# Patient Record
Sex: Female | Born: 1950
Health system: Southern US, Community
[De-identification: ages and names within clinical notes are randomized; demographics above are authoritative.]

## PROBLEM LIST (undated history)

## (undated) DIAGNOSIS — I10 Essential (primary) hypertension: Secondary | ICD-10-CM

## (undated) DIAGNOSIS — E785 Hyperlipidemia, unspecified: Secondary | ICD-10-CM

## (undated) DIAGNOSIS — F419 Anxiety disorder, unspecified: Secondary | ICD-10-CM

## (undated) DIAGNOSIS — T7840XA Allergy, unspecified, initial encounter: Secondary | ICD-10-CM

## (undated) DIAGNOSIS — E119 Type 2 diabetes mellitus without complications: Secondary | ICD-10-CM

## (undated) DIAGNOSIS — K219 Gastro-esophageal reflux disease without esophagitis: Secondary | ICD-10-CM

## (undated) DIAGNOSIS — M1990 Unspecified osteoarthritis, unspecified site: Secondary | ICD-10-CM

## (undated) DIAGNOSIS — C801 Malignant (primary) neoplasm, unspecified: Secondary | ICD-10-CM

## (undated) DIAGNOSIS — F32A Depression, unspecified: Secondary | ICD-10-CM

## (undated) DIAGNOSIS — G709 Myoneural disorder, unspecified: Secondary | ICD-10-CM

## (undated) HISTORY — PX: APPENDECTOMY: SHX54

## (undated) HISTORY — DX: Gastro-esophageal reflux disease without esophagitis: K21.9

## (undated) HISTORY — PX: TONSILLECTOMY: SUR1361

## (undated) HISTORY — DX: Depression, unspecified: F32.A

## (undated) HISTORY — DX: Anxiety disorder, unspecified: F41.9

## (undated) HISTORY — DX: Allergy, unspecified, initial encounter: T78.40XA

## (undated) HISTORY — PX: CHOLECYSTECTOMY: SHX55

---

## 2011-02-28 DIAGNOSIS — E119 Type 2 diabetes mellitus without complications: Secondary | ICD-10-CM | POA: Insufficient documentation

## 2011-02-28 DIAGNOSIS — M25562 Pain in left knee: Secondary | ICD-10-CM | POA: Insufficient documentation

## 2011-02-28 DIAGNOSIS — J029 Acute pharyngitis, unspecified: Secondary | ICD-10-CM | POA: Insufficient documentation

## 2011-02-28 HISTORY — DX: Pain in left knee: M25.562

## 2011-08-24 DIAGNOSIS — I10 Essential (primary) hypertension: Secondary | ICD-10-CM | POA: Insufficient documentation

## 2011-08-24 DIAGNOSIS — J309 Allergic rhinitis, unspecified: Secondary | ICD-10-CM | POA: Insufficient documentation

## 2011-08-24 HISTORY — DX: Allergic rhinitis, unspecified: J30.9

## 2012-03-08 DIAGNOSIS — F32A Depression, unspecified: Secondary | ICD-10-CM | POA: Insufficient documentation

## 2012-08-14 DIAGNOSIS — M79605 Pain in left leg: Secondary | ICD-10-CM | POA: Insufficient documentation

## 2012-08-14 HISTORY — DX: Pain in left leg: M79.605

## 2012-10-09 DIAGNOSIS — M5442 Lumbago with sciatica, left side: Secondary | ICD-10-CM

## 2012-10-09 HISTORY — DX: Lumbago with sciatica, left side: M54.42

## 2013-04-25 DIAGNOSIS — N12 Tubulo-interstitial nephritis, not specified as acute or chronic: Secondary | ICD-10-CM

## 2013-04-25 HISTORY — DX: Tubulo-interstitial nephritis, not specified as acute or chronic: N12

## 2013-06-03 DIAGNOSIS — N390 Urinary tract infection, site not specified: Secondary | ICD-10-CM

## 2013-06-03 DIAGNOSIS — K219 Gastro-esophageal reflux disease without esophagitis: Secondary | ICD-10-CM | POA: Insufficient documentation

## 2013-06-03 HISTORY — DX: Urinary tract infection, site not specified: N39.0

## 2013-08-09 DIAGNOSIS — K3532 Acute appendicitis with perforation and localized peritonitis, without abscess: Secondary | ICD-10-CM | POA: Insufficient documentation

## 2013-08-09 DIAGNOSIS — D72829 Elevated white blood cell count, unspecified: Secondary | ICD-10-CM | POA: Insufficient documentation

## 2013-08-09 DIAGNOSIS — K3580 Unspecified acute appendicitis: Secondary | ICD-10-CM | POA: Insufficient documentation

## 2013-08-09 HISTORY — DX: Unspecified acute appendicitis: K35.80

## 2013-08-09 HISTORY — DX: Acute appendicitis with perforation, localized peritonitis, and gangrene, without abscess: K35.32

## 2017-02-08 ENCOUNTER — Encounter (HOSPITAL_COMMUNITY): Payer: Self-pay | Admitting: Emergency Medicine

## 2017-02-08 ENCOUNTER — Ambulatory Visit (HOSPITAL_COMMUNITY)
Admission: EM | Admit: 2017-02-08 | Discharge: 2017-02-08 | Disposition: A | Discharge status: Home / Self Care | Payer: Self-pay | Attending: Family Medicine | Admitting: Family Medicine

## 2017-02-08 DIAGNOSIS — L84 Corns and callosities: Secondary | ICD-10-CM

## 2017-02-08 HISTORY — DX: Hyperlipidemia, unspecified: E78.5

## 2017-02-08 HISTORY — DX: Essential (primary) hypertension: I10

## 2017-02-08 HISTORY — DX: Type 2 diabetes mellitus without complications: E11.9

## 2017-02-08 NOTE — ED Triage Notes (Signed)
PT has painful calloused areas on left fourth and fifth toes. PT is a diabetic.

## 2017-02-08 NOTE — ED Provider Notes (Signed)
[facility]    CSN: [id number] Arrival date & time: 02/08/17  1343     History   Chief Complaint Chief Complaint  Patient presents with  . Foot Pain    HPI Calea Hribar is a 66 y.o. female.   66 year old Spanish female with history of diabetes presents with chronic ongoing callus formations to the left foot. Involving primarily the side of the fifth toe and beneath the fourth toe plantar aspect. She lives in [place] and had a podiatrist there our over this was not addressed. She is now living in [place] with her daughter. She states the pain started in the past couple weeks with wearing shoes and pressure is applied to the callused areas.      Past Medical History:  Diagnosis Date  . Diabetes mellitus without complication (HCC)   . Hyperlipemia   . Hypertension     There are no active problems to display for this patient.   Past Surgical History:  Procedure Laterality Date  . APPENDECTOMY      OB History    No data available       Home Medications    Prior to Admission medications   Not on File    Family History No family history on file.  Social History Social History  Substance Use Topics  . Smoking status: Current Some Day Smoker  . Smokeless tobacco: Never Used  . Alcohol use Not on file     Allergies   Patient has no known allergies.   Review of Systems Review of Systems  Constitutional: Negative.   Skin:       As per history of present illness. No signs of infection.  All other systems reviewed and are negative.    Physical Exam Triage Vital Signs ED Triage Vitals [02/08/17 1356]  Enc Vitals Group     BP (!) 136/55     Pulse Rate 83     Resp 16     Temp 98.1 F (36.7 C)     Temp Source Oral     SpO2 100 %     Weight 183 lb (83 kg)     Height      Head Circumference      Peak Flow      Pain Score 8     Pain Loc      Pain Edu?      Excl. in GC?    No data found.   Updated Vital Signs BP  (!) 136/55   Pulse 83   Temp 98.1 F (36.7 C) (Oral)   Resp 16   Wt 183 lb (83 kg)   SpO2 100%   Visual Acuity Right Eye Distance:   Left Eye Distance:   Bilateral Distance:    Right Eye Near:   Left Eye Near:    Bilateral Near:     Physical Exam  Constitutional: She is oriented to person, place, and time. She appears well-developed and well-nourished. No distress.  Eyes: EOM are normal.  Neck: Normal range of motion. Neck supple.  Cardiovascular: Normal rate.   Pulmonary/Chest: Effort normal. No respiratory distress.  Musculoskeletal: She exhibits tenderness. She exhibits no edema.  Neurological: She is alert and oriented to person, place, and time. She exhibits normal muscle tone.  Skin: Skin is warm and dry.  thick callous formation to the lateral aspect of the fifth toe and calcium formation beneath the fourth toe plantar aspect of the foot. No drainage, bleeding, erythema or  other signs of infection.  Psychiatric: She has a normal mood and affect.  Nursing note and vitals reviewed.    UC Treatments / Results  Labs (all labs ordered are listed, but only abnormal results are displayed) Labs Reviewed - No data to display  EKG  EKG Interpretation None       Radiology No results found.  Procedures Procedures (including critical care time)  Medications Ordered in UC Medications - No data to display   Initial Impression / Assessment and Plan / UC Course  I have reviewed the triage vital signs and the nursing notes.  Pertinent labs & imaging results that were available during my care of the patient were reviewed by me and considered in my medical decision making (see chart for details).    Recommend using a circular foam padding to condition around the corns to old pressure for pain control. Follow-up with the podiatrist as soon as possible.    Final Clinical Impressions(s) / UC Diagnoses   Final diagnoses:  Pre-ulcerative corn or callous    New  Prescriptions New Prescriptions   No medications on file     Controlled Substance Prescriptions [place] Controlled Substance Registry consulted? Not Applicable   Hayden Rasmussen, NP 02/08/17 1523

## 2017-02-08 NOTE — Discharge Instructions (Signed)
Recommend using a circular foam padding to condition around the corns to old pressure for pain control. Follow-up with the podiatrist as soon as possible.

## 2017-02-10 DIAGNOSIS — L84 Corns and callosities: Secondary | ICD-10-CM | POA: Insufficient documentation

## 2017-08-16 DIAGNOSIS — R42 Dizziness and giddiness: Secondary | ICD-10-CM

## 2017-08-16 DIAGNOSIS — K529 Noninfective gastroenteritis and colitis, unspecified: Secondary | ICD-10-CM | POA: Insufficient documentation

## 2017-08-16 HISTORY — DX: Dizziness and giddiness: R42

## 2017-08-16 HISTORY — DX: Noninfective gastroenteritis and colitis, unspecified: K52.9

## 2018-06-07 DIAGNOSIS — F419 Anxiety disorder, unspecified: Secondary | ICD-10-CM | POA: Insufficient documentation

## 2018-11-16 DIAGNOSIS — U071 COVID-19: Secondary | ICD-10-CM

## 2018-11-16 HISTORY — DX: COVID-19: U07.1

## 2020-01-03 ENCOUNTER — Encounter: Payer: Self-pay | Admitting: Family Medicine

## 2020-01-03 ENCOUNTER — Ambulatory Visit (INDEPENDENT_AMBULATORY_CARE_PROVIDER_SITE_OTHER): Payer: Self-pay | Admitting: Family Medicine

## 2020-01-03 ENCOUNTER — Ambulatory Visit (INDEPENDENT_AMBULATORY_CARE_PROVIDER_SITE_OTHER): Payer: Self-pay

## 2020-01-03 DIAGNOSIS — M5441 Lumbago with sciatica, right side: Secondary | ICD-10-CM

## 2020-01-03 DIAGNOSIS — G8929 Other chronic pain: Secondary | ICD-10-CM

## 2020-01-03 DIAGNOSIS — M4316 Spondylolisthesis, lumbar region: Secondary | ICD-10-CM

## 2020-01-03 DIAGNOSIS — M5442 Lumbago with sciatica, left side: Secondary | ICD-10-CM

## 2020-01-03 MED ORDER — GABAPENTIN 100 MG PO CAPS: ORAL_CAPSULE | ORAL | 3 refills | Status: DC

## 2020-01-03 MED FILL — GABAPENTIN 100 MG CAPSULE: 100 | 30 days supply | Qty: 90 | Fill #0

## 2020-01-03 NOTE — Progress Notes (Signed)
   Office Visit Note   Patient: Julia Knight           Date of Birth: 1950/10/27           MRN: [id number] Visit Date: 01/03/2020 Requested by: No referring provider defined for this encounter. PCP: Patient, No Pcp Per  Subjective: Chief Complaint  Patient presents with  . Lower Back - Pain    Pain across lower back and radiates down both legs x 2 years.  Numbness in both feet, rt>lt. She has been seeing a doctor in [place], but the patient has moved here & the daughter would like her to be seen here. Also, c/o callous lt little toe     HPI: She is here with low back and bilateral leg pain.  Symptoms started about 2 years ago, no definite injury.  She has had pain across the lower back with radiation into both legs with a constant numbness in her feet, right side greater than left.  She has seen her PCP who prescribed Tylenol.  She has not done anything else for treatment.  She has diabetes which has been controlled per her report on Metformin.  Denies bowel or bladder dysfunction, fevers or chills.  She also has a painful corn on her left fifth toe.              ROS:   All other systems were reviewed and are negative.  Objective: Vital Signs: There were no vitals taken for this visit.  Physical Exam:  General:  Alert and oriented, in no acute distress. Pulm:  Breathing unlabored. Psy:  Normal mood, congruent affect. Skin:   There is a corn on the medial side of her left fifth toe, no cellulitis. Low back: Tender in the musculature lumbosacral area.  No pain in the sciatic notch.  Straight leg raise negative, no pain with internal hip rotation.  Lower extremity strength and reflexes are still normal and equal.  Imaging: XR Lumbar Spine 2-3 Views  Result Date: 01/03/2020 X-rays show what appears to be L3-4 spondylolisthesis of about 9 mm.  L4-5 has about 3 to 4 mm of anterolisthesis.  There is multilevel degenerative disc disease with spurring.  No sign of  compression fracture or neoplasm.   Assessment & Plan: 1.  Chronic low back pain with bilateral leg numbness, possibly due to spondylolisthesis resulting in stenosis.  Cannot rule out diabetic neuropathy contributing to her numbness. -Discussed options with her and elected to try physical therapy.  Gabapentin prescribed as well. -If after 6 to 8 weeks she is not improving, we will consider MRI scan lumbar spine.     Procedures: No procedures performed  No notes on file     PMFS History: There are no problems to display for this patient.  History reviewed. No pertinent past medical history.  History reviewed. No pertinent family history.  History reviewed. No pertinent surgical history. Social History   Occupational History  . Not on file  Tobacco Use  . Smoking status: Not on file  Substance and Sexual Activity  . Alcohol use: Not on file  . Drug use: Not on file  . Sexual activity: Not on file

## 2020-01-20 ENCOUNTER — Encounter: Payer: Self-pay | Admitting: Physical Therapy

## 2020-01-20 ENCOUNTER — Ambulatory Visit (INDEPENDENT_AMBULATORY_CARE_PROVIDER_SITE_OTHER): Payer: Self-pay | Admitting: Physical Therapy

## 2020-01-20 ENCOUNTER — Other Ambulatory Visit: Payer: Self-pay

## 2020-01-20 DIAGNOSIS — M5442 Lumbago with sciatica, left side: Secondary | ICD-10-CM

## 2020-01-20 DIAGNOSIS — R2689 Other abnormalities of gait and mobility: Secondary | ICD-10-CM

## 2020-01-20 DIAGNOSIS — M6281 Muscle weakness (generalized): Secondary | ICD-10-CM

## 2020-01-20 DIAGNOSIS — M5441 Lumbago with sciatica, right side: Secondary | ICD-10-CM

## 2020-01-20 DIAGNOSIS — G8929 Other chronic pain: Secondary | ICD-10-CM

## 2020-01-20 NOTE — Therapy (Signed)
[facility] Physical Therapy [address] [place], [place], [address] Phone: [phone]   Fax:  [phone]  Physical Therapy Evaluation  Patient Details  Name: Julia Knight MRN: [id number] Date of Birth: 05-Dec-1950 Referring Provider (PT): Lavada Mesi, MD   Encounter Date: 01/20/2020   PT End of Session - 01/20/20 1508    Visit Number 1    Number of Visits 12    Date for PT Re-Evaluation 03/02/20    PT Start Time 1434    PT Stop Time 1512    PT Time Calculation (min) 38 min    Behavior During Therapy [facility] for tasks assessed/performed           History reviewed. No pertinent past medical history.  History reviewed. No pertinent surgical history.  There were no vitals filed for this visit.    Subjective Assessment - 01/20/20 1429    Subjective She is here with low back and bilateral leg pain.  Symptoms started about 2 years ago, no definite injury.  She has had pain across the lower back with radiation into both legs with a constant numbness in her feet, right side greater than left.  She has seen her PCP who prescribed Tylenol.  She has not done anything else for treatment.  She has diabetes which has been controlled per her report on Metformin.  Denies bowel or bladder dysfunction, fevers or chills.    Patient is accompained by: Interpreter    Pertinent History DM    Limitations Lifting;Standing;Walking;House hold activities    How long can you sit comfortably? 30 min    How long can you stand comfortably? 5 min    How long can you walk comfortably? 30 min at most if she has [facility]    Diagnostic tests Lumbar XR 01/03/20 "X-rays show what appears to be L3-4 spondylolisthesis of about 9 mm.  L4-5 has about 3 to 4 mm of anterolisthesis.  There is multilevel degenerative disc disease with spurring.  No sign of compression fracture or neoplasm."    Patient Stated Goals reduce pain    Currently in Pain? Yes    Pain Score 10-Worst pain ever     Pain Location Back    Pain Orientation Right;Left    Pain Descriptors / Indicators Aching;Burning;Numbness;Tightness    Pain Type Chronic pain    Pain Radiating Towards down both legs    Pain Onset More than a month ago    Pain Frequency Constant    Aggravating Factors  everything    Pain Relieving Factors meds    Multiple Pain Sites No              [facility] PT Assessment - 01/20/20 0001      Assessment   Medical Diagnosis Chronic bilateral low back pain with bilateral sciatica, Spondylolishesis    Referring Provider (PT) Hilts, Michael, MD    Onset Date/Surgical Date --   2 year onset of pain   Next MD Visit 10/8    Prior Therapy none      Precautions   Precautions None      Restrictions   Weight Bearing Restrictions No      Balance Screen   Has the patient fallen in the past 6 months No    Has the patient had a decrease in activity level because of a fear of falling?  No    Is the patient reluctant to leave their home because of a fear of falling?  No  Home Environment   Living Environment Private residence      Prior Function   Level of Independence Independent    Vocation Unemployed      Cognition   Overall Cognitive Status Within Functional Limits for tasks assessed      Sensation   Light Touch Appears Intact      Coordination   Gross Motor Movements are Fluid and Coordinated Yes      ROM / Strength   AROM / PROM / Strength AROM;Strength      AROM   AROM Assessment Site Lumbar    Lumbar Flexion WNL    Lumbar Extension 50%    Lumbar - Right Side Bend WNL    Lumbar - Left Side Bend WNL    Lumbar - Right Rotation 50%    Lumbar - Left Rotation 50%      Strength   Overall Strength Comments leg strength overall 4+/5 MMT bilat      Flexibility   Soft Tissue Assessment /Muscle Length --   tight glutes and lumbar P.S.     Palpation   Palpation comment TTP lumbar spine and paraspinals      Special Tests   Other special tests +slump test on Rt that  was neg on Lt, + SLR test on Rt that was neg on Lt      Transfers   Transfers Independent with all Transfers   increased time needed due to pain     Ambulation/Gait   Gait Comments relays she uses [facility] for community distances but not for household distances, does not arrive with AD today, has antalgic gait noted                      Objective measurements completed on examination: See above findings.       [facility] Adult PT Treatment/Exercise - 01/20/20 0001      Modalities   Modalities Electrical Stimulation      Electrical Stimulation   Electrical Stimulation Location Lumbar    Electrical Stimulation Action IFC    Electrical Stimulation Parameters tolerance in sittting and reports significant pain relief from this    Electrical Stimulation Goals Pain                  PT Education - 01/20/20 1507    Education Details HEP, POC, TENS    Person(s) Educated Patient    Methods Explanation;Demonstration;Verbal cues;Handout    Comprehension Verbalized understanding;Returned demonstration               PT Long Term Goals - 01/20/20 1517      PT LONG TERM GOAL #1   Title Pt will reduce overall pain to less than 5/10 with usual activity    Baseline 10    Time 6    Period Weeks    Status New    Target Date 03/02/20      PT LONG TERM GOAL #2   Title Pt will improve lumbar ROM to [facility].    Time 6    Period Weeks    Status New      PT LONG TERM GOAL #3   Title Pt will be I and compliant with HEP.    Time 6    Period Weeks    Status New                  Plan - 01/20/20 1512    Clinical Impression Statement Pt presents with chronic  LBP with bilat radiculopathy Rt leg worse than Lt. "X-rays show what appears to be L3-4 spondylolisthesis of about 9 mm.  L4-5 has about 3 to 4 mm of anterolisthesis." There is multilevel degenerative disc disease with spurring. She did have + SLR and slump test on Rt that was negative on Lt. She had much less LBP  after TENS therapy at end. She will benefit from skilled PT to address her deficits in lumbar ROM, decreased activity tolerance, lumbar/core strenght, and pain.    Personal Factors and Comorbidities Comorbidity 1    Comorbidities DM    Examination-Activity Limitations Lift;Stand;Squat;Carry;Sleep;Bend;Locomotion Level    Examination-Participation Restrictions Cleaning;Community Activity;Laundry;Shop    Stability/Clinical Decision Making Evolving/Moderate complexity    Clinical Decision Making Moderate    Rehab Potential Good    PT Frequency 2x / week   1-2   PT Duration 6 weeks    PT Treatment/Interventions ADLs/Self Care Home Management;Electrical Stimulation;Cryotherapy;Moist Heat;Traction;Ultrasound;Gait training;Therapeutic activities;Therapeutic exercise;Neuromuscular re-education;Manual techniques;Passive range of motion;Dry needling;Joint Manipulations;Spinal Manipulations;Taping    PT Next Visit Plan review HEP, trial flexion based program due to stenosis and anteriolisthesis    PT Home Exercise Plan Access Code: [email or web address]    Consulted and Agree with Plan of Care Patient           Patient will benefit from skilled therapeutic intervention in order to improve the following deficits and impairments:  Abnormal gait, Decreased activity tolerance, Decreased endurance, Decreased range of motion, Decreased strength, Increased fascial restricitons, Increased muscle spasms, Impaired flexibility, Postural dysfunction, Improper body mechanics, Pain, Obesity  Visit Diagnosis: Chronic bilateral low back pain with bilateral sciatica  Muscle weakness (generalized)  Other abnormalities of gait and mobility     Problem List There are no problems to display for this patient.   Birdie Riddle 01/20/2020, 3:58 PM  [facility] Physical Therapy [address] [place], [place], [address] Phone: [phone]   Fax:  [phone]  Name: Chloeanne Poteet MRN:  [id number] Date of Birth: 09/11/1950

## 2020-01-20 NOTE — Patient Instructions (Signed)
Access Code: [email or web address] URL: [email or web address] Date: 01/20/2020 Prepared by: Ivery Quale  Exercises Supine Piriformis Stretch - 2 x daily - 6 x weekly - 3 reps - 1 sets - 30 hold Supine Lower Trunk Rotation - 2 x daily - 6 x weekly - 2-3 reps - 1 sets - 10 hold Supine March - 2 x daily - 6 x weekly - 10 reps - 1-2 sets Seated Slump Nerve Glide - 2 x daily - 6 x weekly - 10 reps - 1-2 sets - 3 hold Seated Posterior Pelvic Tilt - 2 x daily - 6 x weekly - 3 sets - 10 reps  Patient Education TENS Therapy

## 2020-01-23 ENCOUNTER — Other Ambulatory Visit: Payer: Self-pay

## 2020-01-23 ENCOUNTER — Ambulatory Visit (INDEPENDENT_AMBULATORY_CARE_PROVIDER_SITE_OTHER): Payer: Self-pay | Admitting: Physical Therapy

## 2020-01-23 DIAGNOSIS — M6281 Muscle weakness (generalized): Secondary | ICD-10-CM

## 2020-01-23 DIAGNOSIS — M5441 Lumbago with sciatica, right side: Secondary | ICD-10-CM

## 2020-01-23 DIAGNOSIS — M5442 Lumbago with sciatica, left side: Secondary | ICD-10-CM

## 2020-01-23 DIAGNOSIS — G8929 Other chronic pain: Secondary | ICD-10-CM

## 2020-01-23 DIAGNOSIS — R2689 Other abnormalities of gait and mobility: Secondary | ICD-10-CM

## 2020-01-23 NOTE — Therapy (Signed)
[facility] Physical Therapy [address] [place], [place], [address] Phone: [phone]   Fax:  [phone]  Physical Therapy Treatment  Patient Details  Name: Julia Knight MRN: [id number] Date of Birth: 1951/04/10 Referring Provider (PT): Lavada Mesi, MD   Encounter Date: 01/23/2020   PT End of Session - 01/23/20 1633    Visit Number 2    Number of Visits 12    Date for PT Re-Evaluation 03/02/20    PT Start Time 1600    PT Stop Time 1645    PT Time Calculation (min) 45 min    Behavior During Therapy [facility] for tasks assessed/performed           No past medical history on file.  No past surgical history on file.  There were no vitals filed for this visit.   Subjective Assessment - 01/23/20 1608    Subjective relays 9/10 LBP. Does say she was able to sleep without pain for the first time after TENS from her last visit. She does relay she is performing HEP and does not feel pain with the exercises.    Patient is accompained by: Interpreter    Pertinent History DM    Limitations Lifting;Standing;Walking;House hold activities    How long can you sit comfortably? 30 min    How long can you stand comfortably? 5 min    How long can you walk comfortably? 30 min at most if she has [facility]    Diagnostic tests Lumbar XR 01/03/20 "X-rays show what appears to be L3-4 spondylolisthesis of about 9 mm.  L4-5 has about 3 to 4 mm of anterolisthesis.  There is multilevel degenerative disc disease with spurring.  No sign of compression fracture or neoplasm."    Patient Stated Goals reduce pain    Pain Onset More than a month ago                             [facility] Adult PT Treatment/Exercise - 01/23/20 0001      Exercises   Exercises Lumbar      Lumbar Exercises: Stretches   Piriformis Stretch Right;Left;2 reps;30 seconds    Gastroc Stretch Right;Left;2 reps;30 seconds    Other Lumbar Stretch Exercise seated Pball roll outs 10 sec X 10  reps      Lumbar Exercises: Aerobic   Nustep L4 X 8 min UE/LE      Lumbar Exercises: Machines for Strengthening   Leg Press bilat push 50 lbs 2X15      Lumbar Exercises: Standing   Theraband Level (Row) Level 3 (Green)    Row Limitations Both 2X15 with TAC      Modalities   Modalities Electrical Stimulation;Moist Heat      Moist Heat Therapy   Number Minutes Moist Heat 10 Minutes    Moist Heat Location Lumbar Spine      Electrical Stimulation   Electrical Stimulation Location Lumbar    Electrical Stimulation Action IFC    Electrical Stimulation Parameters tolerance in sitting 12 min with heat    Electrical Stimulation Goals Pain                       PT Long Term Goals - 01/20/20 1517      PT LONG TERM GOAL #1   Title Pt will reduce overall pain to less than 5/10 with usual activity    Baseline 10    Time 6  Period Weeks    Status New    Target Date 03/02/20      PT LONG TERM GOAL #2   Title Pt will improve lumbar ROM to [facility].    Time 6    Period Weeks    Status New      PT LONG TERM GOAL #3   Title Pt will be I and compliant with HEP.    Time 6    Period Weeks    Status New                 Plan - 01/23/20 1633    Clinical Impression Statement She relays high pain levels upon arrival but despite this had good exercise tolerance. She did have episode of leg going numb with standing rows but then this went away after seated leg press. Continued with TENS but added heat with this to reduce overall pain.    Personal Factors and Comorbidities Comorbidity 1    Comorbidities DM    Examination-Activity Limitations Lift;Stand;Squat;Carry;Sleep;Bend;Locomotion Level    Examination-Participation Restrictions Cleaning;Community Activity;Laundry;Shop    Stability/Clinical Decision Making Evolving/Moderate complexity    Rehab Potential Good    PT Frequency 2x / week   1-2   PT Duration 6 weeks    PT Treatment/Interventions ADLs/Self Care Home  Management;Electrical Stimulation;Cryotherapy;Moist Heat;Traction;Ultrasound;Gait training;Therapeutic activities;Therapeutic exercise;Neuromuscular re-education;Manual techniques;Passive range of motion;Dry needling;Joint Manipulations;Spinal Manipulations;Taping    PT Next Visit Plan review HEP, trial flexion based program due to stenosis and anteriolisthesis    PT Home Exercise Plan Access Code: [email or web address]    Consulted and Agree with Plan of Care Patient           Patient will benefit from skilled therapeutic intervention in order to improve the following deficits and impairments:  Abnormal gait, Decreased activity tolerance, Decreased endurance, Decreased range of motion, Decreased strength, Increased fascial restricitons, Increased muscle spasms, Impaired flexibility, Postural dysfunction, Improper body mechanics, Pain, Obesity  Visit Diagnosis: Chronic bilateral low back pain with bilateral sciatica  Muscle weakness (generalized)  Other abnormalities of gait and mobility     Problem List There are no problems to display for this patient.   Birdie Riddle 01/23/2020, 4:35 PM  [facility] Physical Therapy [address] [place], [place], [address] Phone: [phone]   Fax:  [phone]  Name: Julia Knight MRN: [id number] Date of Birth: 04-Sep-1950

## 2020-02-05 ENCOUNTER — Other Ambulatory Visit: Payer: Self-pay

## 2020-02-05 ENCOUNTER — Encounter: Payer: Self-pay | Admitting: Physical Therapy

## 2020-02-05 ENCOUNTER — Ambulatory Visit (INDEPENDENT_AMBULATORY_CARE_PROVIDER_SITE_OTHER): Payer: Self-pay | Admitting: Physical Therapy

## 2020-02-05 DIAGNOSIS — M5442 Lumbago with sciatica, left side: Secondary | ICD-10-CM

## 2020-02-05 DIAGNOSIS — M6281 Muscle weakness (generalized): Secondary | ICD-10-CM

## 2020-02-05 DIAGNOSIS — G8929 Other chronic pain: Secondary | ICD-10-CM

## 2020-02-05 DIAGNOSIS — R2689 Other abnormalities of gait and mobility: Secondary | ICD-10-CM

## 2020-02-05 DIAGNOSIS — M5441 Lumbago with sciatica, right side: Secondary | ICD-10-CM

## 2020-02-05 NOTE — Therapy (Signed)
[facility] [address] [place], [place], [address] Phone: [phone]   Fax:  [phone]  Physical Therapy Treatment  Patient Details  Name: Julia Knight MRN: [id number] Date of Birth: [birth year] Referring Provider (PT): Lavada Mesi, MD   Encounter Date: 02/05/2020   PT End of Session - 02/05/20 1148    Visit Number 3    Number of Visits 12    Date for PT Re-Evaluation 03/02/20    PT Start Time 1100    PT Stop Time 1140    PT Time Calculation (min) 40 min    Behavior During Therapy [facility] for tasks assessed/performed           History reviewed. No pertinent past medical history.  History reviewed. No pertinent surgical history.  There were no vitals filed for this visit.   Subjective Assessment - 02/05/20 1129    Subjective still relays 9/10 pain and maybe even a little worse, she feels that PT and the exercises are not helping    Patient is accompained by: Interpreter    Pertinent History DM    Limitations Lifting;Standing;Walking;House hold activities    How long can you sit comfortably? 30 min    How long can you stand comfortably? 5 min    How long can you walk comfortably? 30 min at most if she has [facility]    Diagnostic tests Lumbar XR 01/03/20 "X-rays show what appears to be L3-4 spondylolisthesis of about 9 mm.  L4-5 has about 3 to 4 mm of anterolisthesis.  There is multilevel degenerative disc disease with spurring.  No sign of compression fracture or neoplasm."    Patient Stated Goals reduce pain    Pain Onset More than a month ago             [facility] Adult PT Treatment/Exercise - 02/05/20 0001      Lumbar Exercises: Aerobic   Nustep L4 X 6 min UE/LE      Lumbar Exercises: Machines for Strengthening   Leg Press bilat push 50 lbs 2X15      Modalities   Modalities Traction      Traction   Type of Traction Lumbar    Min (lbs) 70    Max (lbs) 80    Time 25 min   including set up time and education time                       PT Long Term Goals - 01/20/20 1517      PT LONG TERM GOAL #1   Title Pt will reduce overall pain to less than 5/10 with usual activity    Baseline 10    Time 6    Period Weeks    Status New    Target Date 03/02/20      PT LONG TERM GOAL #2   Title Pt will improve lumbar ROM to [facility]    Time 6    Period Weeks    Status New      PT LONG TERM GOAL #3   Title Pt will be I and compliant with HEP.    Time 6    Period Weeks    Status New                 Plan - 02/05/20 1150    Clinical Impression Statement continues to rate high pain and that the therapy has not helped.Trialed mechanical traction today for spinal deocmpression in efforts to  reduce overall LBP. She does relay that she got at least short term pain relief from this this session and wants to come back again for traction treatment.    Personal Factors and Comorbidities Comorbidity 1    Comorbidities DM    Examination-Activity Limitations Lift;Stand;Squat;Carry;Sleep;Bend;Locomotion Level    Examination-Participation Restrictions Cleaning;Community Activity;Laundry;Shop    Stability/Clinical Decision Making Evolving/Moderate complexity    Rehab Potential Good    PT Frequency 2x / week   1-2   PT Duration 6 weeks    PT Treatment/Interventions ADLs/Self Care Home Management;Electrical Stimulation;Cryotherapy;Moist Heat;Traction;Ultrasound;Gait training;Therapeutic activities;Therapeutic exercise;Neuromuscular re-education;Manual techniques;Passive range of motion;Dry needling;Joint Manipulations;Spinal Manipulations;Taping    PT Next Visit Plan review HEP, trial flexion based program due to stenosis and anteriolisthesis    PT Home Exercise Plan Access Code: [email or web address]    Consulted and Agree with Plan of Care Patient           Patient will benefit from skilled therapeutic intervention in order to improve the following deficits and impairments:  Abnormal gait, Decreased activity  tolerance, Decreased endurance, Decreased range of motion, Decreased strength, Increased fascial restricitons, Increased muscle spasms, Impaired flexibility, Postural dysfunction, Improper body mechanics, Pain, Obesity  Visit Diagnosis: Chronic bilateral low back pain with bilateral sciatica  Muscle weakness (generalized)  Other abnormalities of gait and mobility     Problem List There are no problems to display for this patient.   Birdie Riddle 02/05/2020, 11:52 AM  [facility] Physical Therapy [address] [place], [place], [address] Phone: [phone]   Fax:  [phone]  Name: Julia Knight MRN: [id number] Date of Birth: [birth year]

## 2020-02-06 ENCOUNTER — Ambulatory Visit (INDEPENDENT_AMBULATORY_CARE_PROVIDER_SITE_OTHER): Payer: Self-pay | Admitting: Physical Therapy

## 2020-02-06 DIAGNOSIS — G8929 Other chronic pain: Secondary | ICD-10-CM

## 2020-02-06 DIAGNOSIS — M6281 Muscle weakness (generalized): Secondary | ICD-10-CM

## 2020-02-06 DIAGNOSIS — M5442 Lumbago with sciatica, left side: Secondary | ICD-10-CM

## 2020-02-06 DIAGNOSIS — M5441 Lumbago with sciatica, right side: Secondary | ICD-10-CM

## 2020-02-06 DIAGNOSIS — R2689 Other abnormalities of gait and mobility: Secondary | ICD-10-CM

## 2020-02-06 NOTE — Therapy (Addendum)
[facility] Physical Therapy [address] [place], [place], [address] Phone: [phone]   Fax:  [phone]  Physical Therapy Treatment/Discharge addendum PHYSICAL THERAPY DISCHARGE SUMMARY  Visits from Start of Care: 4  Current functional level related to goals / functional outcomes: See below   Remaining deficits: See below   Education / Equipment: HEP  Plan: Patient agrees to discharge.  Patient goals were not met. Patient is being discharged due to lack of progress.  ?????  Stopped PT to have MRI and follow up with MD due to lack of progress.  Elsie Ra, PT, DPT 04/20/20 3:38 PM      Patient Details  Name: Julia Knight MRN: [id number] Date of Birth: 03-01-1951 Referring Provider (PT): Eunice Blase, MD   Encounter Date: 02/06/2020   PT End of Session - 02/06/20 1324    Visit Number 4    Number of Visits 12    Date for PT Re-Evaluation 03/02/20    PT Start Time 1300    PT Stop Time 1340    PT Time Calculation (min) 40 min    Behavior During Therapy [facility] for tasks assessed/performed           No past medical history on file.  No past surgical history on file.  There were no vitals filed for this visit.   Subjective Assessment - 02/06/20 1309    Subjective relays the traction machine helped some, relays 8/10 pain today which is better than the 9/10 pain she usually has    Patient is accompained by: Interpreter    Pertinent History DM    Limitations Lifting;Standing;Walking;House hold activities    How long can you sit comfortably? 30 min    How long can you stand comfortably? 5 min    How long can you walk comfortably? 30 min at most if she has [facility]    Diagnostic tests Lumbar XR 01/03/20 "X-rays show what appears to be L3-4 spondylolisthesis of about 9 mm.  L4-5 has about 3 to 4 mm of anterolisthesis.  There is multilevel degenerative disc disease with spurring.  No sign of compression fracture or neoplasm."     Patient Stated Goals reduce pain    Pain Onset More than a month ago           [facility] Adult PT Treatment/Exercise - 02/06/20 0001      Lumbar Exercises: Stretches   Gastroc Stretch Right;Left;3 reps;30 seconds      Lumbar Exercises: Aerobic   Nustep L45 X 6 min UE/LE      Lumbar Exercises: Machines for Strengthening   Leg Press bilat push 50 lbs 2X15      Modalities   Modalities Traction      Traction   Type of Traction Lumbar    Min (lbs) 70    Max (lbs) 80    Time 25 min   20 min tx, 5 min set up/breakdown                      PT Long Term Goals - 01/20/20 1517      PT LONG TERM GOAL #1   Title Pt will reduce overall pain to less than 5/10 with usual activity    Baseline 10    Time 6    Period Weeks    Status New    Target Date 03/02/20      PT LONG TERM GOAL #2   Title Pt will improve lumbar ROM to  WFL.    Time 6    Period Weeks    Status New      PT LONG TERM GOAL #3   Title Pt will be I and compliant with HEP.    Time 6    Period Weeks    Status New                 Plan - 02/06/20 1325    Clinical Impression Statement Pain levels some better subjectively after mechanical traction so this was continued today at her request.    Personal Factors and Comorbidities Comorbidity 1    Comorbidities DM    Examination-Activity Limitations Lift;Stand;Squat;Carry;Sleep;Bend;Locomotion Level    Examination-Participation Restrictions Cleaning;Community Activity;Laundry;Shop    Stability/Clinical Decision Making Evolving/Moderate complexity    Rehab Potential Good    PT Frequency 2x / week   1-2   PT Duration 6 weeks    PT Treatment/Interventions ADLs/Self Care Home Management;Electrical Stimulation;Cryotherapy;Moist Heat;Traction;Ultrasound;Gait training;Therapeutic activities;Therapeutic exercise;Neuromuscular re-education;Manual techniques;Passive range of motion;Dry needling;Joint Manipulations;Spinal Manipulations;Taping    PT Next Visit  Plan review HEP, trial flexion based program due to stenosis and anteriolisthesis    PT Home Exercise Plan Access Code: [email or web address]    Consulted and Agree with Plan of Care Patient           Patient will benefit from skilled therapeutic intervention in order to improve the following deficits and impairments:  Abnormal gait, Decreased activity tolerance, Decreased endurance, Decreased range of motion, Decreased strength, Increased fascial restricitons, Increased muscle spasms, Impaired flexibility, Postural dysfunction, Improper body mechanics, Pain, Obesity  Visit Diagnosis: Chronic bilateral low back pain with bilateral sciatica  Muscle weakness (generalized)  Other abnormalities of gait and mobility     Problem List There are no problems to display for this patient.   Debbe Odea, PT,DPT 02/06/2020, 1:25 PM  [facility] Physical Therapy [address] [place], [place], [address] Phone: [phone]   Fax:  [phone]  Name: Udell Blasingame MRN: [id number] Date of Birth: Oct 15, 1950

## 2020-02-11 ENCOUNTER — Encounter: Payer: Self-pay | Admitting: Physical Therapy

## 2020-02-14 ENCOUNTER — Ambulatory Visit (INDEPENDENT_AMBULATORY_CARE_PROVIDER_SITE_OTHER): Payer: Self-pay | Admitting: Family Medicine

## 2020-02-14 ENCOUNTER — Other Ambulatory Visit: Payer: Self-pay

## 2020-02-14 ENCOUNTER — Other Ambulatory Visit: Payer: Self-pay | Admitting: Family Medicine

## 2020-02-14 ENCOUNTER — Encounter: Payer: Self-pay | Admitting: Family Medicine

## 2020-02-14 DIAGNOSIS — M5441 Lumbago with sciatica, right side: Secondary | ICD-10-CM

## 2020-02-14 DIAGNOSIS — G8929 Other chronic pain: Secondary | ICD-10-CM

## 2020-02-14 DIAGNOSIS — M5442 Lumbago with sciatica, left side: Secondary | ICD-10-CM

## 2020-02-14 DIAGNOSIS — M4316 Spondylolisthesis, lumbar region: Secondary | ICD-10-CM

## 2020-02-14 MED ORDER — TRAMADOL HCL 50 MG PO TABS: 50.0000 mg | ORAL_TABLET | Freq: Every evening | ORAL | 0 refills | Status: DC | PRN

## 2020-02-14 MED FILL — traMADol HCL 50 MG TABS: 50 | 15 days supply | Qty: 30 | Fill #0

## 2020-02-14 NOTE — Progress Notes (Signed)
   Office Visit Note   Patient: Julia Knight           Date of Birth: June 30, 1950           MRN: [id number] Visit Date: 02/14/2020 Requested by: No referring provider defined for this encounter. PCP: Patient, No Pcp Per  Subjective: Chief Complaint  Patient presents with  . Lower Back - Pain    HPI: Julia Knight is here for follow-up low back and bilateral posterior hip pain.  Julia Knight did physical therapy and it might of helped a small amount with her low back pain, but her posterior hip pain continues and Julia Knight has pain all day long with numbness and tingling down the right leg.  Her leg feels weak at times.  Interpreter present today.              ROS:   All other systems were reviewed and are negative.  Objective: Vital Signs: There were no vitals taken for this visit.  Physical Exam:  General:  Alert and oriented, in no acute distress. Pulm:  Breathing unlabored. Psy:  Normal mood, congruent affect.  Low back: Julia Knight has tenderness in both sciatic notch areas, especially on the right.  Straight leg raise is equivocal.  Lower extremity strength is still normal, Julia Knight has 2+ knee and hypoactive Achilles DTRs bilaterally.  Imaging: No results found.  Assessment & Plan: 1.  Lumbar spondylolisthesis with chronic back pain and right leg radicular pain -Julia Knight very much wants to avoid surgery.  Julia Knight would like to try an epidural injection.  We will also order lumbar MRI scan.  Tramadol as needed for pain.     Procedures: No procedures performed  No notes on file     PMFS History: There are no problems to display for this patient.  History reviewed. No pertinent past medical history.  History reviewed. No pertinent family history.  History reviewed. No pertinent surgical history. Social History   Occupational History  . Not on file  Tobacco Use  . Smoking status: Not on file  Substance and Sexual Activity  . Alcohol use: Not on file  . Drug use: Not on file  . Sexual  activity: Not on file

## 2020-02-18 ENCOUNTER — Encounter: Payer: Self-pay | Admitting: Physical Therapy

## 2020-02-21 ENCOUNTER — Encounter: Payer: Self-pay | Admitting: Physical Therapy

## 2020-03-11 ENCOUNTER — Other Ambulatory Visit: Payer: Self-pay

## 2020-03-13 ENCOUNTER — Encounter: Payer: Self-pay | Admitting: Physical Therapy

## 2020-03-13 ENCOUNTER — Telehealth: Payer: Self-pay | Admitting: Physical Therapy

## 2020-03-13 NOTE — Telephone Encounter (Signed)
Pt no show for PT appointment today. PT had interpreter call and she states she was sick. She does not have any more PT scheduled and MD has ordered MRI for 03/31/20. PT informed her that it would be best to wait for the MRI results and then follow up with MD first and then we can resume PT after that if MD feels this would be appropriate.  Ivery Quale, PT, DPT 03/13/20 10:42 AM

## 2020-03-31 ENCOUNTER — Other Ambulatory Visit: Payer: Self-pay

## 2020-04-10 ENCOUNTER — Other Ambulatory Visit: Payer: Self-pay

## 2020-04-10 ENCOUNTER — Ambulatory Visit
Admission: RE | Admit: 2020-04-10 | Discharge: 2020-04-10 | Disposition: A | Discharge status: Home / Self Care | Payer: Self-pay | Source: Ambulatory Visit | Attending: Family Medicine | Admitting: Family Medicine

## 2020-04-10 DIAGNOSIS — G8929 Other chronic pain: Secondary | ICD-10-CM

## 2020-04-10 DIAGNOSIS — M5442 Lumbago with sciatica, left side: Secondary | ICD-10-CM

## 2020-04-10 DIAGNOSIS — M4316 Spondylolisthesis, lumbar region: Secondary | ICD-10-CM

## 2020-04-13 ENCOUNTER — Telehealth: Payer: Self-pay | Admitting: Family Medicine

## 2020-04-13 NOTE — Telephone Encounter (Signed)
She has appointment in the office on 04/14/20 for review of the MRI, with an interpreter.

## 2020-04-13 NOTE — Telephone Encounter (Signed)
MRI shows severe narrowing of the spinal canal at 3 levels, and moderate at another level.    Does she have another epidural injection scheduled?  If not, we could arrange one.  Another option would be consultation with a surgeon.

## 2020-04-14 ENCOUNTER — Ambulatory Visit (INDEPENDENT_AMBULATORY_CARE_PROVIDER_SITE_OTHER): Payer: Self-pay | Admitting: Family Medicine

## 2020-04-14 ENCOUNTER — Other Ambulatory Visit: Payer: Self-pay

## 2020-04-14 DIAGNOSIS — M48062 Spinal stenosis, lumbar region with neurogenic claudication: Secondary | ICD-10-CM

## 2020-04-14 NOTE — Progress Notes (Signed)
   Office Visit Note   Patient: Julia Knight           Date of Birth: 1950/08/10           MRN: [id number] Visit Date: 04/14/2020 Requested by: No referring provider defined for this encounter. PCP: Patient, No Pcp Per  Subjective: Chief Complaint  Patient presents with  . Lower Back - Pain, Follow-up    MRI review. Pain has worsened since last office visit. Tramadol only helps a little. Has not had any Gabapentin. This helped her pain more. Was taking 1 qd, up to 1 bid prn.    HPI: She is here for follow-up chronic back pain with bilateral radiation into the legs.  No change in her symptoms.  She is here to discuss MRI results.  Interpreter is present.                ROS:   All other systems were reviewed and are negative.  Objective: Vital Signs: There were no vitals taken for this visit.  Physical Exam:  General:  Alert and oriented, in no acute distress. Pulm:  Breathing unlabored. Psy:  Normal mood, congruent affect.  No exam done today.  Imaging: MRI images were reviewed on computer showing severe spinal stenosis at L1-2, L2-3 and L3-4.  There is moderate by foraminal stenosis at L4-5.    Assessment & Plan: 1.  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 -We discussed options and she wants to try an epidural injection and also consult with a surgeon.  We will arrange both for her.  She will follow-up based on surgeon recommendations.     Procedures: No procedures performed  No notes on file     PMFS History: There are no problems to display for this patient.  No past medical history on file.  No family history on file.  No past surgical history on file. Social History   Occupational History  . Not on file  Tobacco Use  . Smoking status: Not on file  Substance and Sexual Activity  . Alcohol use: Not on file  . Drug use: Not on file  . Sexual activity: Not on file

## 2020-05-13 ENCOUNTER — Other Ambulatory Visit: Payer: Self-pay

## 2020-05-13 ENCOUNTER — Ambulatory Visit (INDEPENDENT_AMBULATORY_CARE_PROVIDER_SITE_OTHER): Payer: Self-pay | Admitting: Physical Medicine and Rehabilitation

## 2020-05-13 ENCOUNTER — Ambulatory Visit: Payer: Self-pay

## 2020-05-13 VITALS — BP 151/79 | HR 82

## 2020-05-13 DIAGNOSIS — M5416 Radiculopathy, lumbar region: Secondary | ICD-10-CM

## 2020-05-13 MED ORDER — DEXAMETHASONE SODIUM PHOSPHATE 10 MG/ML IJ SOLN
15.0000 mg | Freq: Once | INTRAMUSCULAR | Status: AC
  Administered 2020-05-13: 15 mg

## 2020-05-13 NOTE — Progress Notes (Signed)
Numeric Pain Rating Scale and Functional Assessment Average Pain 7    In the last MONTH (on 0-10 scale) has pain interfered with the following?  1. General activity like being  able to carry out your everyday physical activities such as walking, climbing stairs, carrying groceries, or moving a chair?  Rating(over 10)   +Driver, -BT, -Dye Allergies.  Little back pain, but her pain in the feet is the worst, with numbness, and tingling

## 2020-05-22 DIAGNOSIS — Z6834 Body mass index (BMI) 34.0-34.9, adult: Secondary | ICD-10-CM

## 2020-05-22 DIAGNOSIS — M4316 Spondylolisthesis, lumbar region: Secondary | ICD-10-CM | POA: Insufficient documentation

## 2020-05-22 HISTORY — DX: Body mass index (BMI) 34.0-34.9, adult: Z68.34

## 2020-06-03 ENCOUNTER — Telehealth: Payer: Self-pay | Admitting: Family Medicine

## 2020-06-03 ENCOUNTER — Other Ambulatory Visit: Payer: Self-pay

## 2020-06-03 DIAGNOSIS — M48062 Spinal stenosis, lumbar region with neurogenic claudication: Secondary | ICD-10-CM

## 2020-06-03 NOTE — Telephone Encounter (Signed)
She was referred to Dr. Verlee Rossetti group. Please advise.

## 2020-06-03 NOTE — Telephone Encounter (Signed)
Please refer to Dr. Otelia Sergeant.

## 2020-06-03 NOTE — Telephone Encounter (Signed)
Pt son in law called because the place Christien was referred to doesn't take financial assistance so was wondering if she could be referred somewhere different. Please give him a call at  [phone] because she does not speak english.

## 2020-06-03 NOTE — Telephone Encounter (Signed)
I called and spoke with [facility], the patient's son-in-law, advising him of the new plan of referring to Dr. Otelia Sergeant in our practice. He asked that we call his wife, Victorio Palm, with the appointment information (same phone # as the patient).

## 2020-06-08 ENCOUNTER — Ambulatory Visit: Payer: Self-pay | Admitting: Family Medicine

## 2020-06-24 NOTE — Procedures (Signed)
Lumbosacral Transforaminal Epidural Steroid Injection - Sub-Pedicular Approach with Fluoroscopic Guidance  Patient: Julia Knight      Date of Birth: 11-30-1950 MRN: [id number] PCP: Patient, No Pcp Per      Visit Date: 05/13/2020   Universal Protocol:    Date/Time: 05/13/2020  Consent Given By: the patient  Position: PRONE  Additional Comments: Vital signs were monitored before and after the procedure. Patient was prepped and draped in the usual sterile fashion. The correct patient, procedure, and site was verified.   Injection Procedure Details:   Procedure diagnoses: Lumbar radiculopathy [M54.16]    Meds Administered:  Meds ordered this encounter  Medications   dexamethasone (DECADRON) injection 15 mg    Laterality: Bilateral  Location/Site:  L5-S1  Needle:5.0 in., 22 ga.  Short bevel or Quincke spinal needle  Needle Placement: Transforaminal  Findings:    -Comments: Excellent flow of contrast along the nerve, nerve root and into the epidural space.  Procedure Details: After squaring off the end-plates to get a true AP view, the C-arm was positioned so that an oblique view of the foramen as noted above was visualized. The target area is just inferior to the "nose of the scotty dog" or sub pedicular. The soft tissues overlying this structure were infiltrated with 2-3 ml. of 1% Lidocaine without Epinephrine.  The spinal needle was inserted toward the target using a "trajectory" view along the fluoroscope beam.  Under AP and lateral visualization, the needle was advanced so it did not puncture dura and was located close the 6 O'Clock position of the pedical in AP tracterory. Biplanar projections were used to confirm position. Aspiration was confirmed to be negative for CSF and/or blood. A 1-2 ml. volume of Isovue-250 was injected and flow of contrast was noted at each level. Radiographs were obtained for documentation purposes.   After attaining the desired flow of  contrast documented above, a 0.5 to 1.0 ml test dose of 0.25% Marcaine was injected into each respective transforaminal space.  The patient was observed for 90 seconds post injection.  After no sensory deficits were reported, and normal lower extremity motor function was noted,   the above injectate was administered so that equal amounts of the injectate were placed at each foramen (level) into the transforaminal epidural space.   Additional Comments:  The patient tolerated the procedure well Dressing: 2 x 2 sterile gauze and Band-Aid    Post-procedure details: Patient was observed during the procedure. Post-procedure instructions were reviewed.  Patient left the clinic in stable condition.

## 2020-06-24 NOTE — Progress Notes (Signed)
Julia Knight - 70 y.o. female MRN [id number]  Date of birth: [birth year]  Office Visit Note: Visit Date: 05/13/2020 PCP: Patient, No Pcp Per Referred by: Lavada Mesi, MD  Subjective: Chief Complaint  Patient presents with  . Lower Back - Pain  . Left Leg - Pain  . Right Leg - Pain  . Right Foot - Numbness  . Left Foot - Numbness   HPI:  Julia Knight is a 70 y.o. female who comes in today at the request of Dr. Lavada Mesi for planned Bilateral L4-L5 Lumbar epidural steroid injection with fluoroscopic guidance.  The patient has failed conservative care including home exercise, medications, time and activity modification.  This injection will be diagnostic and hopefully therapeutic.  Please see requesting physician notes for further details and justification.  MRI reviewed with images and spine model.  MRI reviewed in the note below.   ROS Otherwise per HPI.  Assessment & Plan: Visit Diagnoses:    ICD-10-CM   1. Lumbar radiculopathy  M54.16 XR C-ARM NO REPORT    Epidural Steroid injection    dexamethasone (DECADRON) injection 15 mg    Plan: No additional findings.   Meds & Orders:  Meds ordered this encounter  Medications  . dexamethasone (DECADRON) injection 15 mg    Orders Placed This Encounter  Procedures  . XR C-ARM NO REPORT  . Epidural Steroid injection    Follow-up: Return if symptoms worsen or fail to improve.   Procedures: No procedures performed  Lumbosacral Transforaminal Epidural Steroid Injection - Sub-Pedicular Approach with Fluoroscopic Guidance  Patient: Julia Knight      Date of Birth: [birth year] MRN: [id number] PCP: Patient, No Pcp Per      Visit Date: 05/13/2020   Universal Protocol:    Date/Time: 05/13/2020  Consent Given By: the patient  Position: PRONE  Additional Comments: Vital signs were monitored before and after the procedure. Patient was prepped and draped in the usual sterile fashion. The correct patient,  procedure, and site was verified.   Injection Procedure Details:   Procedure diagnoses: Lumbar radiculopathy [M54.16]    Meds Administered:  Meds ordered this encounter  Medications  . dexamethasone (DECADRON) injection 15 mg    Laterality: Bilateral  Location/Site:  L5-S1  Needle:5.0 in., 22 ga.  Short bevel or Quincke spinal needle  Needle Placement: Transforaminal  Findings:    -Comments: Excellent flow of contrast along the nerve, nerve root and into the epidural space.  Procedure Details: After squaring off the end-plates to get a true AP view, the C-arm was positioned so that an oblique view of the foramen as noted above was visualized. The target area is just inferior to the "nose of the scotty dog" or sub pedicular. The soft tissues overlying this structure were infiltrated with 2-3 ml. of 1% Lidocaine without Epinephrine.  The spinal needle was inserted toward the target using a "trajectory" view along the fluoroscope beam.  Under AP and lateral visualization, the needle was advanced so it did not puncture dura and was located close the 6 O'Clock position of the pedical in AP tracterory. Biplanar projections were used to confirm position. Aspiration was confirmed to be negative for CSF and/or blood. A 1-2 ml. volume of Isovue-250 was injected and flow of contrast was noted at each level. Radiographs were obtained for documentation purposes.   After attaining the desired flow of contrast documented above, a 0.5 to 1.0 ml test dose of 0.25% Marcaine was injected into each respective transforaminal space.  The patient was observed for 90 seconds post injection.  After no sensory deficits were reported, and normal lower extremity motor function was noted,   the above injectate was administered so that equal amounts of the injectate were placed at each foramen (level) into the transforaminal epidural space.   Additional Comments:  The patient tolerated the procedure  well Dressing: 2 x 2 sterile gauze and Band-Aid    Post-procedure details: Patient was observed during the procedure. Post-procedure instructions were reviewed.  Patient left the clinic in stable condition.      Clinical History: MRI LUMBAR SPINE WITHOUT CONTRAST  TECHNIQUE: Multiplanar, multisequence MR imaging of the lumbar spine was performed. No intravenous contrast was administered.  COMPARISON:  None.  FINDINGS: Segmentation:  Standard.  Alignment:  Grade 1 anterolisthesis of L3 on L4.  Vertebrae:  No fracture, evidence of discitis, or bone lesion.  Conus medullaris and cauda equina: Conus extends to the T12-L1 level. Conus and cauda equina appear normal.  Paraspinal and other soft tissues: No acute paraspinal abnormality.  Other: Mild osteoarthritis of the SI joints.  Disc levels:  Disc spaces: Degenerative disease with disc height loss throughout the lumbar spine most severe at L1-2 and L2-3.  T12-L1: Broad-based disc bulge. Moderate bilateral facet arthropathy. Right lateral recess stenosis. Mild spinal stenosis. Mild right foraminal stenosis. No left foraminal stenosis.  L1-L2: Broad-based disc bulge flattening the ventral thecal sac eccentric towards the left. Mild bilateral facet arthropathy. Severe spinal stenosis. Mild left foraminal stenosis. Moderate right foraminal stenosis.  L2-L3: Broad-based disc bulge flattening the ventral thecal sac. Severe bilateral facet arthropathy. Severe spinal stenosis. Severe left and mild right foraminal stenosis.  L3-L4: Broad-based disc bulge with a left paracentral disc protrusion. Severe bilateral facet arthropathy. Severe spinal stenosis. Severe left and mild right foraminal stenosis.  L4-L5: Broad-based disc bulge. Severe bilateral facet arthropathy. Severe spinal stenosis. Moderate bilateral foraminal stenosis, right greater than left.  L5-S1: No significant disc bulge. No evidence of  neural foraminal stenosis. No central canal stenosis.  IMPRESSION: 1. At L1-2 there is a broad-based disc bulge flattening the ventral thecal sac eccentric towards the left. Mild bilateral facet arthropathy. Severe spinal stenosis. 2. At L2-3 there is a broad-based disc bulge flattening the ventral thecal sac. Severe bilateral facet arthropathy. Severe spinal stenosis. Severe left and mild right foraminal stenosis. 3. At L3-4 there is a broad-based disc bulge with a left paracentral disc protrusion. Severe bilateral facet arthropathy. Severe spinal stenosis. 4. At L4-5 there is a broad-based disc bulge. Moderate bilateral foraminal stenosis, right greater than left.   Electronically Signed   By: Elige Ko   On: 04/11/2020 10:40     Objective:  VS:  HT:    WT:   BMI:     BP:(!) 151/79  HR:82bpm  TEMP: ( )  RESP:  Physical Exam Vitals and nursing note reviewed.  Constitutional:      General: She is not in acute distress.    Appearance: Normal appearance. She is not ill-appearing.  HENT:     Head: Normocephalic and atraumatic.     Right Ear: External ear normal.     Left Ear: External ear normal.  Eyes:     Extraocular Movements: Extraocular movements intact.  Cardiovascular:     Rate and Rhythm: Normal rate.     Pulses: Normal pulses.  Pulmonary:     Effort: Pulmonary effort is normal. No respiratory distress.  Abdominal:     General: There is no  distension.     Palpations: Abdomen is soft.  Musculoskeletal:        General: Tenderness present.     Cervical back: Neck supple.     Right lower leg: No edema.     Left lower leg: No edema.     Comments: Patient has good distal strength with no pain over the greater trochanters.  No clonus or focal weakness.  Skin:    Findings: No erythema, lesion or rash.  Neurological:     General: No focal deficit present.     Mental Status: She is alert and oriented to person, place, and time.     Sensory: No sensory  deficit.     Motor: No weakness or abnormal muscle tone.     Coordination: Coordination normal.  Psychiatric:        Mood and Affect: Mood normal.        Behavior: Behavior normal.      Imaging: No results found.

## 2020-07-08 ENCOUNTER — Encounter: Payer: Self-pay | Admitting: Specialist

## 2020-07-08 ENCOUNTER — Ambulatory Visit (INDEPENDENT_AMBULATORY_CARE_PROVIDER_SITE_OTHER): Payer: Self-pay

## 2020-07-08 ENCOUNTER — Other Ambulatory Visit: Payer: Self-pay

## 2020-07-08 ENCOUNTER — Ambulatory Visit (INDEPENDENT_AMBULATORY_CARE_PROVIDER_SITE_OTHER): Payer: Self-pay | Admitting: Specialist

## 2020-07-08 VITALS — BP 126/76 | HR 76 | Ht 62.0 in | Wt 179.0 lb

## 2020-07-08 DIAGNOSIS — M4316 Spondylolisthesis, lumbar region: Secondary | ICD-10-CM

## 2020-07-08 DIAGNOSIS — L84 Corns and callosities: Secondary | ICD-10-CM

## 2020-07-08 DIAGNOSIS — M48062 Spinal stenosis, lumbar region with neurogenic claudication: Secondary | ICD-10-CM

## 2020-07-08 DIAGNOSIS — M5442 Lumbago with sciatica, left side: Secondary | ICD-10-CM

## 2020-07-08 DIAGNOSIS — M5441 Lumbago with sciatica, right side: Secondary | ICD-10-CM

## 2020-07-08 DIAGNOSIS — G8929 Other chronic pain: Secondary | ICD-10-CM

## 2020-07-08 NOTE — Patient Instructions (Signed)
Plan: Avoid bending, stooping and avoid lifting weights greater than 10 lbs. Avoid prolong standing and walking. Order for a new walker with wheels. Surgery scheduling secretary Tivis Ringer, will call you in the next week to schedule for surgery.  Surgery recommended is a Four level lumbar decompressive laminectomy L1-2, L2-3, L3-4 and L4-5 with  two level lumbar fusion L3-4 and L4-5 this would be done with rods, screws and cages with local bone graft and allograft (donor bone graft),vivigen. Take hydrocodone for for pain. Risk of surgery includes risk of infection 1 in 200 patients, bleeding 1/2% chance you would need a transfusion.   Risk to the nerves is one in 10,000. You will need to use a brace for 3 months and wean from the brace on the 4th month. Expect improved walking and standing tolerance. Expect relief of leg pain but numbness may persist depending on the length and degree of pressure that has been present.

## 2020-07-08 NOTE — Progress Notes (Signed)
Office Visit Note   Patient: Julia Knight           Date of Birth: 1950-09-02           MRN: [id number] Visit Date: 07/08/2020              Requested by: Lavada Mesi, MD [address] [place],  [place] [address] PCP: Patient, No Pcp Per   Assessment & Plan: Visit Diagnoses:  1. Spinal stenosis of lumbar region with neurogenic claudication   2. Spondylolisthesis, lumbar region   3. Chronic bilateral low back pain with bilateral sciatica   4. Corn of toe     Plan: Avoid bending, stooping and avoid lifting weights greater than 10 lbs. Avoid prolong standing and walking. Order for a new walker with wheels. Surgery scheduling secretary Tivis Ringer, will call you in the next week to schedule for surgery.  Surgery recommended is a Four level lumbar decompressive laminectomy L1-2, L2-3, L3-4 and L4-5 with  two level lumbar fusion L3-4 and L4-5 this would be done with rods, screws and cages with local bone graft and allograft (donor bone graft),vivigen. Take hydrocodone for for pain. Risk of surgery includes risk of infection 1 in 200 patients, bleeding 1/2% chance you would need a transfusion.   Risk to the nerves is one in 10,000. You will need to use a brace for 3 months and wean from the brace on the 4th month. Expect improved walking and standing tolerance. Expect relief of leg pain but numbness may persist depending on the length and degree of pressure that has been present.    Follow-Up Instructions: No follow-ups on file.   Orders:  Orders Placed This Encounter  Procedures  . XR Lumb Spine Flex&Ext Only   No orders of the defined types were placed in this encounter.     Procedures: No procedures performed   Clinical Data: Findings:  Narrative & Impression CLINICAL DATA:  Low back pain with radiation down bilateral legs with numbness for 3 years.  EXAM: MRI LUMBAR SPINE WITHOUT CONTRAST  TECHNIQUE: Multiplanar, multisequence MR imaging of the  lumbar spine was performed. No intravenous contrast was administered.  COMPARISON:  None.  FINDINGS: Segmentation:  Standard.  Alignment:  Grade 1 anterolisthesis of L3 on L4.  Vertebrae:  No fracture, evidence of discitis, or bone lesion.  Conus medullaris and cauda equina: Conus extends to the T12-L1 level. Conus and cauda equina appear normal.  Paraspinal and other soft tissues: No acute paraspinal abnormality.  Other: Mild osteoarthritis of the SI joints.  Disc levels:  Disc spaces: Degenerative disease with disc height loss throughout the lumbar spine most severe at L1-2 and L2-3.  T12-L1: Broad-based disc bulge. Moderate bilateral facet arthropathy. Right lateral recess stenosis. Mild spinal stenosis. Mild right foraminal stenosis. No left foraminal stenosis.  L1-L2: Broad-based disc bulge flattening the ventral thecal sac eccentric towards the left. Mild bilateral facet arthropathy. Severe spinal stenosis. Mild left foraminal stenosis. Moderate right foraminal stenosis.  L2-L3: Broad-based disc bulge flattening the ventral thecal sac. Severe bilateral facet arthropathy. Severe spinal stenosis. Severe left and mild right foraminal stenosis.  L3-L4: Broad-based disc bulge with a left paracentral disc protrusion. Severe bilateral facet arthropathy. Severe spinal stenosis. Severe left and mild right foraminal stenosis.  L4-L5: Broad-based disc bulge. Severe bilateral facet arthropathy. Severe spinal stenosis. Moderate bilateral foraminal stenosis, right greater than left.  L5-S1: No significant disc bulge. No evidence of neural foraminal stenosis. No central canal stenosis.  IMPRESSION: 1.  At L1-2 there is a broad-based disc bulge flattening the ventral thecal sac eccentric towards the left. Mild bilateral facet arthropathy. Severe spinal stenosis. 2. At L2-3 there is a broad-based disc bulge flattening the ventral thecal sac. Severe bilateral  facet arthropathy. Severe spinal stenosis. Severe left and mild right foraminal stenosis. 3. At L3-4 there is a broad-based disc bulge with a left paracentral disc protrusion. Severe bilateral facet arthropathy. Severe spinal stenosis. 4. At L4-5 there is a broad-based disc bulge. Moderate bilateral foraminal stenosis, right greater than left.   Electronically Signed   By: Elige Ko   On: 04/11/2020 10:40       Subjective: Chief Complaint  Patient presents with  . Lower Back - Pain    70 year old female with 2-3 year history of difficulty with prolong standing and walking. With prolong standing and walking her legs feel  Weak. She is unable to grocery shop and not able to walk distances, even from the parking lot into the office today.  Has balance difficulty and has lost control with bladder and bowel occasionally. She has undergone MRI and more recently was seen by Dr. Danielle Dess and evaluated with MRI showing L1-2, L2-3, L3-4 and L4-5 lumbar spinal stenosis that is severe and spondylolisthesis L3-4 and L4-5. She reports that her insurance is not good and she would have to pay a copay in excess of 30K so that she is seeking an alternative way to Have intervention. Underwent attempts at [facility] by Dr. Alvester Morin without relief. She has seen no improvement over the last 3 years, in fact worsening of balance and has fallen in the past.    Review of Systems  Constitutional: Negative.   HENT: Negative.   Eyes: Negative.   Respiratory: Negative.   Cardiovascular: Negative.   Gastrointestinal: Negative.   Endocrine: Negative.   Genitourinary: Negative.   Musculoskeletal: Negative.   Skin: Negative.   Allergic/Immunologic: Negative.   Neurological: Negative.   Hematological: Negative.   Psychiatric/Behavioral: Negative.      Objective: Vital Signs: BP 126/76 (BP Location: Left Arm, Patient Position: Sitting)   Pulse 76   Ht 5\' 2"  (1.575 m)   Wt 179 lb (81.2 kg)   BMI 32.74  kg/m   Physical Exam Constitutional:      Appearance: She is well-developed and well-nourished.  HENT:     Head: Normocephalic and atraumatic.  Eyes:     Extraocular Movements: EOM normal.     Pupils: Pupils are equal, round, and reactive to light.  Pulmonary:     Effort: Pulmonary effort is normal.     Breath sounds: Normal breath sounds.  Abdominal:     General: Bowel sounds are normal.     Palpations: Abdomen is soft.  Musculoskeletal:     Cervical back: Normal range of motion and neck supple.  Skin:    General: Skin is warm and dry.  Neurological:     Mental Status: She is alert and oriented to person, place, and time.  Psychiatric:        Mood and Affect: Mood and affect normal.        Behavior: Behavior normal.        Thought Content: Thought content normal.        Judgment: Judgment normal.     Back Exam   Tenderness  The patient is experiencing tenderness in the lumbar.  Range of Motion  Extension: abnormal  Flexion: normal  Lateral bend right: abnormal  Lateral bend  left: abnormal  Rotation right: abnormal  Rotation left: abnormal   Muscle Strength  Right Quadriceps:  5/5  Left Quadriceps:  5/5  Right Hamstrings:  5/5  Left Hamstrings:  5/5   Reflexes  Patellar: 1/4 Achilles: 1/4 Biceps: 1/4  Comments:  Bilateral foot DF weakness 4/5, PF weakness is 4/5.      Specialty Comments:  No specialty comments available.  Imaging: No results found.   PMFS History: There are no problems to display for this patient.  History reviewed. No pertinent past medical history.  History reviewed. No pertinent family history.  History reviewed. No pertinent surgical history. Social History   Occupational History  . Not on file  Tobacco Use  . Smoking status: Not on file  . Smokeless tobacco: Not on file  Substance and Sexual Activity  . Alcohol use: Not on file  . Drug use: Not on file  . Sexual activity: Not on file

## 2020-07-14 ENCOUNTER — Telehealth: Payer: Self-pay

## 2020-07-14 NOTE — Telephone Encounter (Signed)
Patient stopped by to inquire about surgery being scheduled.  Need order.  Thanks!

## 2020-07-15 NOTE — Telephone Encounter (Signed)
Started blue sheet and gave it to Dr. Otelia Sergeant to complete

## 2020-07-15 NOTE — Telephone Encounter (Signed)
This has been done.

## 2020-07-22 ENCOUNTER — Telehealth: Payer: Self-pay

## 2020-07-22 NOTE — Telephone Encounter (Signed)
Patient called states she was told to let us know when she got in with her PCP She sees PCP this Friday

## 2020-07-23 ENCOUNTER — Encounter: Payer: Self-pay | Admitting: Specialist

## 2020-07-23 NOTE — Telephone Encounter (Signed)
Not sure which one needs this.

## 2020-07-24 ENCOUNTER — Encounter (HOSPITAL_BASED_OUTPATIENT_CLINIC_OR_DEPARTMENT_OTHER): Payer: Self-pay | Admitting: Nurse Practitioner

## 2020-07-24 ENCOUNTER — Other Ambulatory Visit (HOSPITAL_BASED_OUTPATIENT_CLINIC_OR_DEPARTMENT_OTHER)
Admission: RE | Admit: 2020-07-24 | Discharge: 2020-07-24 | Disposition: A | Discharge status: Home / Self Care | Payer: Self-pay | Source: Ambulatory Visit | Attending: Nurse Practitioner | Admitting: Nurse Practitioner

## 2020-07-24 ENCOUNTER — Other Ambulatory Visit (HOSPITAL_BASED_OUTPATIENT_CLINIC_OR_DEPARTMENT_OTHER): Payer: Self-pay

## 2020-07-24 ENCOUNTER — Ambulatory Visit (INDEPENDENT_AMBULATORY_CARE_PROVIDER_SITE_OTHER): Payer: Self-pay | Admitting: Nurse Practitioner

## 2020-07-24 ENCOUNTER — Other Ambulatory Visit: Payer: Self-pay

## 2020-07-24 VITALS — BP 130/60 | HR 64 | Ht 61.0 in | Wt 181.0 lb

## 2020-07-24 DIAGNOSIS — E782 Mixed hyperlipidemia: Secondary | ICD-10-CM

## 2020-07-24 DIAGNOSIS — K219 Gastro-esophageal reflux disease without esophagitis: Secondary | ICD-10-CM

## 2020-07-24 DIAGNOSIS — Z0000 Encounter for general adult medical examination without abnormal findings: Secondary | ICD-10-CM | POA: Insufficient documentation

## 2020-07-24 DIAGNOSIS — I10 Essential (primary) hypertension: Secondary | ICD-10-CM | POA: Insufficient documentation

## 2020-07-24 DIAGNOSIS — E1165 Type 2 diabetes mellitus with hyperglycemia: Secondary | ICD-10-CM | POA: Insufficient documentation

## 2020-07-24 DIAGNOSIS — E785 Hyperlipidemia, unspecified: Secondary | ICD-10-CM | POA: Insufficient documentation

## 2020-07-24 DIAGNOSIS — Z6834 Body mass index (BMI) 34.0-34.9, adult: Secondary | ICD-10-CM

## 2020-07-24 HISTORY — DX: Encounter for general adult medical examination without abnormal findings: Z00.00

## 2020-07-24 LAB — CBC WITH DIFFERENTIAL/PLATELET
Abs Immature Granulocytes: 0.04 10*3/uL (ref 0.00–0.07)
Basophils Absolute: 0.1 10*3/uL (ref 0.0–0.1)
Basophils Relative: 1 %
Eosinophils Absolute: 0.5 10*3/uL (ref 0.0–0.5)
Eosinophils Relative: 4 %
HCT: 37.4 % (ref 36.0–46.0)
Hemoglobin: 12.1 g/dL (ref 12.0–15.0)
Immature Granulocytes: 0 %
Lymphocytes Relative: 35 %
Lymphs Abs: 4.1 10*3/uL — ABNORMAL HIGH (ref 0.7–4.0)
MCH: 28.8 pg (ref 26.0–34.0)
MCHC: 32.4 g/dL (ref 30.0–36.0)
MCV: 89 fL (ref 80.0–100.0)
Monocytes Absolute: 0.8 10*3/uL (ref 0.1–1.0)
Monocytes Relative: 7 %
Neutro Abs: 6.2 10*3/uL (ref 1.7–7.7)
Neutrophils Relative %: 53 %
Platelets: 272 10*3/uL (ref 150–400)
RBC: 4.2 MIL/uL (ref 3.87–5.11)
RDW: 13.2 % (ref 11.5–15.5)
WBC: 11.7 10*3/uL — ABNORMAL HIGH (ref 4.0–10.5)
nRBC: 0 % (ref 0.0–0.2)

## 2020-07-24 LAB — COMPREHENSIVE METABOLIC PANEL
ALT: 15 U/L (ref 0–44)
AST: 10 U/L — ABNORMAL LOW (ref 15–41)
Albumin: 4.5 g/dL (ref 3.5–5.0)
Alkaline Phosphatase: 153 U/L — ABNORMAL HIGH (ref 38–126)
Anion gap: 8 (ref 5–15)
BUN: 18 mg/dL (ref 8–23)
CO2: 24 mmol/L (ref 22–32)
Calcium: 9.7 mg/dL (ref 8.9–10.3)
Chloride: 105 mmol/L (ref 98–111)
Creatinine, Ser: 0.89 mg/dL (ref 0.44–1.00)
GFR, Estimated: >60 mL/min (ref >60)
Glucose, Bld: 162 mg/dL — ABNORMAL HIGH (ref 70–99)
Potassium: 5.3 mmol/L — ABNORMAL HIGH (ref 3.5–5.1)
Sodium: 137 mmol/L (ref 135–145)
Total Bilirubin: 0.3 mg/dL (ref 0.3–1.2)
Total Protein: 7.4 g/dL (ref 6.5–8.1)

## 2020-07-24 LAB — HEMOGLOBIN A1C
Hgb A1c MFr Bld: 8.2 % — ABNORMAL HIGH (ref 4.8–5.6)
Mean Plasma Glucose: 188.64 mg/dL

## 2020-07-24 LAB — LIPID PANEL
Cholesterol: 221 mg/dL — ABNORMAL HIGH (ref 0–200)
HDL: 51 mg/dL (ref >40)
LDL Cholesterol: 106 mg/dL — ABNORMAL HIGH (ref 0–99)
Total CHOL/HDL Ratio: 4.3 RATIO
Triglycerides: 321 mg/dL — ABNORMAL HIGH (ref <150)
VLDL: 64 mg/dL — ABNORMAL HIGH (ref 0–40)

## 2020-07-24 MED ORDER — ATORVASTATIN CALCIUM 40 MG PO TABS
ORAL_TABLET | ORAL | 11 refills | Status: DC
Start: 2020-07-24 — End: 2020-07-24

## 2020-07-24 MED ORDER — METFORMIN HCL 1000 MG PO TABS: ORAL_TABLET | ORAL | 11 refills | Status: DC

## 2020-07-24 MED ORDER — PANTOPRAZOLE SODIUM 40 MG PO TBEC: DELAYED_RELEASE_TABLET | ORAL | 0 refills | Status: DC

## 2020-07-24 MED ORDER — LISINOPRIL 20 MG PO TABS: ORAL_TABLET | ORAL | 11 refills | Status: DC

## 2020-07-24 NOTE — Assessment & Plan Note (Signed)
New patient to this practice and provider. She is new to the area and in need of primary care services for surgical clearance for expected upcoming back surgery. With history of hypertension, hyperlipidemia, and diabetes we will need to ensure that all of her chronic diseases are well controlled prior to surgery to help reduce the risk of complications and delayed healing. We will plan for labs today for further evaluation and make recommendations based on lab results received. We will obtain previous records from external providers and update the record as necessary.

## 2020-07-24 NOTE — Assessment & Plan Note (Signed)
Epigastric pain with palpable tenderness and reported increased pain with known triggers for reflux. Based on her previous records it appears that she has been on Nexium in the past however she reports she is no longer taking this medication. We will begin treatment today with pantoprazole 40 mg twice a day dosing for 7 days then decrease to once a day dosing. Information provided to patient in her native language on medication and dosing schedule. Plan to follow-up in approximately 3 months for further evaluation.

## 2020-07-24 NOTE — Assessment & Plan Note (Signed)
History of hyperlipidemia with most recent labs in November of last year.  She is currently taking Lipitor 40 mg/day. Will obtain labs today for further evaluation and recommendations. Unable to make determination for surgical clearance until lab results have been received. Plan to follow-up in 3 months.

## 2020-07-24 NOTE — Assessment & Plan Note (Addendum)
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  We will obtain a hemoglobin A1c today for dated measurements.  Her last labs are at 9.9%. Discussed with patient and family we may need to add additional medication to help get better control of her blood sugars. We will obtain previous records to evaluate for medications that have been tried in the past and historical hemoglobin A1c measurements. Clearance recommendations will be based on the lab results received. Discussed with family that uncontrolled blood sugar will delay healing and could possibly cause increased complications postoperatively and therefore blood sugars must be well controlled prior to surgery to ensure most appropriate outcome. Plan to follow-up in 3 months

## 2020-07-24 NOTE — Patient Instructions (Addendum)
I sent your medicine to the [facility].   Your test results will show up on MyChart.  Please let me know when you are scheduled for surgery.   We will plan to follow-up with your diabetes, high blood pressure, high cholesterol, and gastritis in 3 months to make sure you are doing well.     Thank you for choosing [facility] for your Primary Care needs. I am excited for the opportunity to partner with you to meet your health care goals. It was a pleasure meeting you today!  I am an Adult-Geriatric Nurse Practitioner with a background in caring for patients for more than 20 years. I received my Paediatric nurse in Nursing and my Doctor of Nursing Practice degrees at [employer]. I received additional fellowship training in primary care and sports medicine after receiving my doctorate degree. I provide primary care and sports medicine services to patients age 68 and older within this office. I am also a provider with the [facility] and the director of the APP Fellowship with [facility].  I am a [place] native, but have called the [place] area home for nearly 20 years and am proud to be a member of this community.   I am passionate about providing the best service to you through preventive medicine and supportive care. I consider you a part of the medical team and value your input. I work diligently to ensure that you are heard and your needs are met in a safe and effective manner. I want you to feel comfortable with me as your provider and want you to know that your health concerns are important to me.   For your information, our office hours are Monday- Friday 8:00 AM - 5:00 PM At this time I am not in the office on Wednesdays.  If you have questions or concerns, please call our office at [phone] or send [place] a MyChart message and we will respond as quickly as possible.   For all  urgent or time sensitive needs we ask that you please call the office to avoid delays. MyChart is not constantly monitored and replies may take up to 72 business hours.  MyChart Policy: . MyChart allows for you to see your visit notes, after visit summary, provider recommendations, lab and tests results, make an appointment, request refills, and contact your provider or the office for non-urgent questions or concerns.  . Providers are seeing patients during normal business hours and do not have built in time to review MyChart messages. We ask that you allow a minimum of 72 business hours for MyChart message responses.  . Complex MyChart concerns may require a visit. Your provider may request you schedule a virtual or in person visit to ensure we are providing the best care possible. . MyChart messages sent after 4:00 PM on Friday will not be received by the provider until Monday morning.    Lab and Test Results: . You will receive your lab and test results on MyChart as soon as they are completed and results have been sent by the lab or testing facility. Due to this service, you will receive your results BEFORE your provider.  . Please allow a minimum of 72 business hours for your provider to receive and review lab and test results and contact you about.   . Most lab and test result comments from the provider will be sent through [facility]. Your  provider may recommend changes to the plan of care, follow-up visits, repeat testing, ask questions, or request an office visit to discuss these results. You may reply directly to this message or call the office at [phone] to provide information for the provider or set up an appointment. . In some instances, you will be called with test results and recommendations. Please let us know if this is preferred and we will make note of this in your chart to provide this for you.    . If you have not heard a response to your lab or test results in 72 business hours,  please call the office to let us know.   After Hours: . For all non-emergency after hours needs, please call the office at [phone] and select the option to reach the on-call provider service. On-call services are shared between multiple [facility] offices and therefore it will not be possible to speak directly with your provider. On-call providers may provide medical advice and recommendations, but are unable to provide refills for maintenance medications.  . For all emergency or urgent medical needs after normal business hours, we recommend that you seek care at the closest Urgent Care or Emergency Department to ensure appropriate treatment in a timely manner.  [facility] has a 24 hour emergency room located on the ground floor for your convenience.    Please do not hesitate to reach out to [place] with concerns.   Thank you, again, for choosing me as your health care partner. I appreciate your trust and look forward to learning more about you.   Julia Kelbi Renstrom, DNP, AGNP-c _______________________________________________________________________________________   Conceptos bsicos de la diabetes mellitus Diabetes Mellitus Basics  La diabetes mellitus, o (diabetes) es una enfermedad prolongada (crnica). Se produce cuando el cuerpo no utiliza Occupational hygienist (glucosa) que se libera de los alimentos despus de comer. La diabetes mellitus puede deberse a uno de Mirant o a ambos:  El pncreas no produce suficiente cantidad de una hormona llamada insulina.  El cuerpo no reacciona de forma normal a la insulina que produce. La insulina permite que la glucosa ingrese a las clulas del cuerpo. Esto le proporciona energa. Si tiene diabetes, la glucosa no puede ingresar a las clulas. Esto produce un aumento de la glucemia (hiperglucemia). Cmo tratar y Chief Technology Officer la diabetes Es posible que tenga que administrarse insulina u otros medicamentos para la  diabetes todos los [place] para mantener el nivel de glucosa en la sangre equilibrado. Si le recetan insulina, aprender cmo aplicrsela con inyecciones. Es posible que deba ajustar la cantidad de insulina en funcin de los alimentos que coma. Tendr que controlarse los niveles de glucemia con un monitor de glucosa como se lo haya indicado el mdico. Las lecturas pueden ayudar a Teacher, adult education si tiene un nivel bajo o alto de glucemia. Generalmente, los resultados de los niveles de glucemia deben ser los siguientes:  Antes de las comidas (preprandial): de 80 a 144m/dl (de 4,4 a 7,235ml/l).  Despus de las comidas (posprandial): por debajo de 18038ml (61m14ml).  Nivel dehemoglobina A1c (HbA1c): menos del7%. El mdico fijar objetivos de tratamiento para usted. Cumpla con todas las visitas de seguimiento. Esto es importante. Siga estas instrucciones en su casa:  Nivel bajo de glucemia El nivel bajo de glucemia (hipoglucemia) se produce cuando la glucosa es igual o menor que 70mg53m(3.9mmol7m. Entre los sntomas, se pueden incluir los siguientes:  Sentir: ? HambreOrocovisdoracin y piel hIntel Corporationritabilidad o enfado  con facilidad. ? Mareos. ? Somnolencia.  Tener: ? Latidos cardacos acelerados. ? Dolor de [place]. ? Cambios en la visin. ? Hormigueo o adormecimiento alrededor de la boca, labios o lengua.  Dificultades para hacer lo siguiente: ? Moverse (coordinacin). ? Dormir. Cmo tratar un nivel bajo de glucemia Para tratar un nivel bajo de glucemia, ingiera un alimento o una bebida azucarada de inmediato. Si puede pensar con claridad y tragar de manera segura, siga la regla 15/15, que consiste en lo siguiente:  Tome 15 gramos de un hidrato de carbono (carbohidrato) de accin rpida, como le indic su mdico.  Algunos hidratos de carbono de accin rpida son: ? Comprimidos de glucosa: tome 3 o 4 comprimidos. ? Caramelos duros: coma 3 a 5unidades. ? Jugo de fruta: beba  4onzas (185ml). ? Refresco comn (no diettico): beba de 4 a 6onzas (de 120 a 140ml). ? Miel o azcar: coma 1 cucharada (75ml).  Verifique sus niveles de glucemia 15 minutes despus de ingerir el hidrato de carbono.  Si la glucosa todava es igual o menor que $RemoveB'70mg'FGkFYFJl$ /dl (3.6mmol/l), ingiera nuevamente 15gramos de un hidrato de carbono.  Si la glucosa no supera los $RemoveBe'70mg'FfbGodjml$ /dl (3.74mmol/l) despus de 3intentos, solicite ayuda de inmediato.  Ingiera una comida o una colacin en el transcurso de 1hora despus de que la glucosa se haya normalizado. Cmo tratar un nivel muy bajo de glucemia Si la glucosa es igual o menor que $RemoveB'54mg'pSphmgPE$ /dl (71mmol/l), significa que su nivel de glucemia est muy bajo (hipoglucemia grave). Esto es Engineer, maintenance (IT). No espere a ver si los sntomas desaparecen. Solicite atencin mdica de inmediato. Comunquese con el servicio de emergencias de su localidad (911 en los [place]). No conduzca por sus propios medios [employer] hospital. Preguntas para hacerle al mdico  Es necesario que converse con un educador en el cuidado de la diabetes?  Qu equipos necesitar para cuidarme en casa?  Qu medicamentos para la diabetes necesito? Cundo debo tomarlos?  Con qu frecuencia debo comprobar mis niveles de glucemia?  A qu nmero puedo llamar si tengo preguntas?  Cundo es mi visita de seguimiento?  Dnde puedo encontrar un grupo de apoyo para las personas con diabetes? Dnde buscar ms informacin  American Diabetes Association (Asociacin Estadounidense de la Diabetes): www.diabetes.org  Association of Diabetes Care and Education Specialists (Asociacin de Especialistas en Atencin y Educacin sobre la Diabetes): www.diabeteseducator.org Comunquese con un mdico si:  El nivel de glucemia es mayor o igual que $RemoveB'240mg'WbCuQozG$ /dl (13.82mmol/dl) durante 2das seguidos.  Ha estado enfermo o ha tenido fiebre durante 2das o ms y no mejora.  Tiene alguno de  estos problemas durante ms de 6horas: ? No puede comer ni beber. ? Siente nuseas. ? Vomita. ? Tiene diarrea. Solicite ayuda de inmediato si:  Su nivel de glucemia est por debajo de $RemoveB'54mg'yyBSBaSj$ /dl (7mmol/l).  Se siente confundido.  Tiene problemas para pensar con claridad.  Tiene dificultad para respirar. Estos sntomas pueden representar un problema grave que constituye Engineer, maintenance (IT). No espere a ver si los sntomas desaparecen. Solicite atencin mdica de inmediato. Comunquese con el servicio de emergencias de su localidad (911 en los [place]). No conduzca por sus propios medios Principal Financial. Resumen  La diabetes mellitus es una enfermedad crnica que se produce cuando el cuerpo no utiliza Occupational hygienist (glucosa) que se libera de los alimentos despus de comer.  Aplquese la insulina y tome los medicamentos para la diabetes como se lo hayan indicado.  Controle su nivel de glucemia todos los  das, con la frecuencia que le hayan indicado.  Cumpla con todas las visitas de seguimiento. Esto es importante. Esta informacin no tiene Marine scientist el consejo del mdico. Asegrese de hacerle al mdico cualquier pregunta que tenga. Document Revised: 10/23/2019 Document Reviewed: 10/23/2019 Elsevier Patient Education  2021 [place] High Cholesterol  El colesterol elevado es una afeccin que se caracteriza porque la sangre tiene niveles altos de una sustancia [place], cerosa y parecida a la grasa (colesterol). El hgado fabrica todo el colesterol que el organismo necesita. El organismo humano necesita pequeas cantidades de colesterol para formar las clulas. El exceso de colesterol se obtiene de los alimentos que se consumen. La sangre transporta el colesterol desde el hgado al resto del cuerpo. Si tiene el colesterol elevado, pueden acumularse depsitos (placas) en las paredes de las arterias. Las arterias son los vasos sanguneos que  transportan la sangre desde el corazn al resto del cuerpo. Las placas causan que las arterias se Teacher, music y se endurezcan. Las placas de colesterol aumentan el riesgo de sufrir un infarto de miocardio y un accidente cerebrovascular. Trabaje con el mdico para [employer] concentraciones de colesterol en un rango saludable. Qu incrementa el riesgo? Los siguientes factores pueden hacer que sea ms propenso a Armed forces training and education officer afeccin:  Consumir alimentos con alto contenido de grasa animal (grasa saturada) o colesterol.  Tener sobrepeso.  No hacer suficiente ejercicio fsico.  Tener antecedentes familiares de colesterol alto (hipercolesterolemia familiar).  Consumir productos con tabaco.  Tener diabetes. Cules son los signos o sntomas? Esta afeccin no presenta sntomas. Cmo se diagnostica? Esta afeccin se puede diagnosticar en funcin de los resultados de un anlisis de [place].  Si es mayor de 20aos, es posible que el mdico le controle el nivel de colesterol cada [id number].  Los controles pueden ser ms frecuentes si tiene el colesterol elevado u otros factores de riesgo de enfermedad cardaca. En el anlisis de sangre de [place], se determina lo siguiente:  El colesterol "malo", o colesterol LDL. Este es el principal tipo de colesterol que causa enfermedades cardacas. El nivel recomendado es de menos de100 mg/dl.  El colesterol "bueno", o colesterol HDL. El HDL ayuda a proteger contra la enfermedad cardaca porque limpia las arterias y arrastra el LDL al hgado para que lo procese. El nivel recomendado de HDL es de60 mg/dl o ms.  Triglicridos. Estos son grasas que el organismo puede Financial controller o quemar como fuente de [place]. El nivel recomendado es de menos de150 mg/dl.  Colesterol total. Mide la cantidad total de colesterol en la sangre, e incluye el colesterol LDL, el colesterol HDL y los triglicridos. El nivel recomendado es de menos de200 mg/dl. Cmo se  trata? El tratamiento de esta afeccin puede incluir:  Cambios en la dieta. Es posible que le indiquen que consuma alimentos con ms [place] y menos grasas saturadas o Holiday representative.  Cambios en el estilo de vida. Estos pueden incluir hacer actividad fsica con regularidad, mantener un peso saludable y dejar de consumir productos con tabaco.  Medicamentos. Estos se administran cuando los Harley[employer]son dieta y en el estilo de vida no han sido eficaces. Es posible que le receten medicamentos llamados estatinas para bajar sus niveles de colesterol. Siga estas instrucciones en su casa: Comida y bebida  Siga una dieta saludable y [place]. Esta dieta incluye lo siguiente: ? Porciones diarias de fruta s y verduras frescas, congeladas o enlatadas. ? Porciones diarias de alimentos integrales con alto contenido de  fibra. ? Alimentos con bajo contenido de grasas saturadas y grasas trans. Estos incluyen carne de ave y pescado sin piel, cortes de carne magros y productos lcteos descremados. ? Una variedad de pescado, especialmente pescado graso que contenga cidos grasos omega 3. Propngase comer pescado al [employer] veces por semana.  Evite los alimentos y las bebidas que tengan Holiday representative.  Use mtodos de coccin saludables, como asar, Interior and spatial designer, hervir, hornear, escalfar, cocer al vapor y IT consultant. No fra los alimentos excepto para saltearlos.   Estilo de vida  Hacer ejercicio con regularidad. Trate de hacer un total de 150 minutos de actividad fsica por semana. Aumente la cantidad de ejercicio fsico que realiza mediante actividades como la jardinera, Forensic scientist a Writer o usar las escaleras.  No consuma ningn producto que contenga nicotina o tabaco, como cigarrillos, cigarrillos electrnicos y tabaco de Higher education careers adviser. Si necesita ayuda para dejar de fumar, consulte al mdico.   Instrucciones generales  Use los medicamentos de venta libre y los recetados solamente como se lo haya indicado el  mdico.  Consulting civil engineer a todas las visitas de seguimiento como se lo haya indicado el mdico. Esto es importante. Dnde buscar ms informacin  American Heart Association (Asociacin Estadounidense del Corazn): www.heart.org  National Heart, Lung, and Blood Institute ([place], los Pulmones y Herbalist): https://wilson-eaton.com/ Comunquese con un mdico si:  Tiene dificultad para alcanzar o mantener una alimentacin sana y un peso saludable.  Est por comenzar un programa de ejercicios.  No puede dejar de fumar. Solicite ayuda de inmediato si:  Electronics engineer.  Tiene dificultad para respirar.  Tiene sntomas de un accidente cerebrovascular. "BE FAST" es una manera fcil de recordar los principales signos de advertencia de un accidente cerebrovascular: ? B - Balance (equilibrio). Los signos son mareos, dificultad repentina para caminar o prdida del equilibrio. ? E - Eyes (ojos). Los signos son problemas para ver o un cambio repentino en la visin. ? F - Face (rostro). Los signos son debilidad repentina o adormecimiento del rostro, o el rostro o el prpado que se caen hacia un lado. ? A - Arms (brazos). Los signos son debilidad o adormecimiento en un brazo. Esto sucede de repente y generalmente en un lado del cuerpo. ? S - Speech (habla). Los signos son dificultad para hablar, hablar arrastrando las palabras o dificultad para comprender lo que la gente dice. ? T - Time (tiempo). Es tiempo de llamar al servicio de Multimedia programmer. Anote la hora a la que [employer] sntomas.  Presenta otros signos de un accidente cerebrovascular, como los siguientes: ? Dolor de cabeza sbito e intenso que no tiene causa aparente. ? Nuseas o vmitos. ? Convulsiones. Estos sntomas pueden representar un problema grave que constituye Engineer, maintenance (IT). No espere a ver si los sntomas desaparecen. Solicite atencin mdica de inmediato. Comunquese con el servicio de emergencias de su  localidad (911 en los [place]). No conduzca por sus propios medios Principal Financial. Resumen  Las placas de colesterol aumentan el riesgo de sufrir un infarto de miocardio y un accidente cerebrovascular. Trabaje con el mdico para TEPPCO Partners concentraciones de colesterol en un rango saludable.  Siga una dieta saludable y equilibrada, haga ejercicio con regularidad y Colin Rhein un peso saludable.  No consuma ningn producto que contenga nicotina o tabaco, como cigarrillos, cigarrillos electrnicos y tabaco de Higher education careers adviser.  Obtenga ayuda de inmediato si tiene cualquier sntoma de un accidente cerebrovascular. Esta informacin no tiene Marine scientist el consejo  del mdico. Asegrese de hacerle al mdico cualquier pregunta que tenga. Document Revised: 05/31/2019 Document Reviewed: 05/31/2019 Elsevier Patient Education  2021 [place].   Hipertensin en los adultos Hypertension, Adult El trmino hipertensin es otra forma de denominar a la presin arterial elevada. La presin arterial elevada fuerza al corazn a trabajar ms para bombear la sangre. Esto puede causar problemas con el paso del [place]. Una lectura de presin arterial est compuesta por 2 nmeros. Hay un nmero superior (sistlico) sobre un nmero inferior (diastlico). Lo ideal es tener la presin arterial por debajo de 120/80. Las elecciones saludables pueden ayudar a Engineer, materials presin arterial, o tal vez necesite medicamentos para bajarla. Cules son las causas? Se desconoce la causa de esta afeccin. Algunas afecciones pueden estar relacionadas con la presin arterial alta. Qu incrementa el riesgo?  Fumar.  Tener diabetes mellitus tipo 2, colesterol alto, o ambos.  No hacer la cantidad suficiente de actividad fsica o ejercicio.  Tener sobrepeso.  Consumir mucha grasa, azcar, caloras o sal (sodio) en su dieta.  Beber alcohol en exceso.  Tener una enfermedad renal a largo plazo (crnica).  Tener  antecedentes familiares de presin arterial alta.  Edad. Los riesgos aumentan con la edad.  [place]aza. El riesgo es mayor para las Retail banker.  Sexo. Antes de los 45aos, los hombres corren ms [employer]. Despus de los 65aos, las mujeres corren ms [employer].  Tener apnea obstructiva del sueo.  Estrs. Cules son los signos o los sntomas?  Es posible que la presin arterial alta puede no cause sntomas. La presin arterial muy alta (crisis hipertensiva) puede provocar: ? Dolor de [place]. ? Sensaciones de preocupacin o nerviosismo (ansiedad). ? Falta de aire. ? Hemorragia nasal. ? Sensacin de Engineer, site (nuseas). ? Vmitos. ? Cambios en la forma de ver. ? Dolor muy intenso en el pecho. ? Convulsiones. Cmo se trata?  Esta afeccin se trata haciendo cambios saludables en el estilo de vida, por ejemplo: ? Consumir alimentos saludables. ? Hacer ms ejercicio. ? Beber menos alcohol.  El mdico puede recetarle medicamentos si los cambios en el estilo de vida no son suficientes para Child psychotherapist la presin arterial y si: ? El nmero de arriba est por encima de 130. ? El nmero de abajo est por encima de 80.  Su presin arterial personal ideal puede variar. Siga estas instrucciones en su casa: Comida y bebida  Si se lo dicen, siga el plan de alimentacin de DASH (Dietary Approaches to Stop Hypertension, Maneras de alimentarse para detener la hipertensin). Para seguir este plan: ? Llene la mitad del plato de cada comida con frutas y verduras. ? Llene un cuarto del plato de cada comida con cereales integrales. Los cereales integrales incluyen pasta integral, arroz integral y pan integral. ? Coma y beba productos lcteos con bajo contenido de grasa, como leche descremada o yogur bajo en grasas. ? Llene un cuarto del plato de cada comida con protenas bajas en grasa (magras). Las protenas bajas en grasa incluyen pescado,  pollo sin piel, huevos, frijoles y tofu. ? Evite consumir carne grasa, carne curada y procesada, o pollo con piel. ? Evite consumir alimentos prehechos o procesados.  Consuma menos de 1500 mg de sal por da.  No beba alcohol si: ? El mdico le indica que no lo haga. ? Est embarazada, puede estar embarazada o est tratando de quedar embarazada.  Si bebe alcohol: ? Limite la cantidad que bebe a lo siguiente:  De 0 a 1 medida por da para las mujeres.  De 0 a 2 medidas por da para los hombres. ? Est atento a la cantidad de alcohol que hay en las bebidas que toma. En los [place], una medida equivale a una botella de cerveza de 12oz (354m), un vaso de vino de 5oz (1444m o un vaso de una bebida alcohlica de alta graduacin de 1oz (4457m   Estilo de vida  Trabaje con su mdico para mantenerse en un peso saludable o para perder peso. Pregntele a su mdico cul es el peso recomendable para usted.  Haga al menos 73m21mos de ejercicio la mayoHartford Financialla semaTokelandtos pueden incluir caminar, nadar o andar en bicicleta.  Realice al menos 30 minutos de ejercicio que fortalezca sus msculos (ejercicios de resistencia) al menos 3 das a la semaJuliettetos pueden incluir levantar pesas o hacer [place].  No consuma ningn producto que contenga nicotina o tabaco, como cigarrillos, cigarrillos electrnicos y tabaco de mascHigher education careers adviser necesita ayuda para dejar de fumar, consulte al mdicMeadWestvacoontrole su presin arterial en su casa tal como le indic el mdico.  Concurra a todas las visitas de seguimiento como se lo haya indicado el mdico. Esto es importante.   Medicamentos  TomeDelphiventa libre y los recetados solamente como se lo haya indicado el mdico. Siga cuidadosamente las indicaciones.  No omita las dosis de medicamentos para la presin arterial. Los medicamentos pierden eficacia si omite dosis. El hecho de omitir las dosis tambin aumeSerbiariesgo de  otros problemas.  Pregntele a su mdico a qu efectos secundarios o reacciones a los mediCareers information officermunquese con un mdico si:  Piensa que tiene una Mexicoccin a los medicamentos que est tomando.  Tiene dolores de cabeza frecuentes (recurrentes).  Se siente mareado.  Tiene hinchazn en los tobillos.  Tiene problemas de visin. Solicite ayuda inmediatamente si:  Siente un dolor de cabeza muy intenso.  Empieza a sentirse desorientado (confundido).  Se siente dbil o adormecido.  Siente que va a desmayarse.  Tiene un dolor muy intenso en las siguientes zonas: ? Pecho. ? Vientre (abdomen).  Vomita ms de una vez.  Tiene dificultad para respirar. Resumen  El trmino hipertensin es otra forma de denominar a la presin arterial elevada.  La presin arterial elevada fuerza al corazn a trabajar ms para bombear la sangre.  Para la mayoComcasta presin arterial normal es menor que 120/80.  Las decisiones saludables pueden ayudarle a disminuir su presin arterial. Si no puede bajar su presin arterial mediante decisiones saludables, es posible que deba tomar medicamentos. Esta informacin no tiene comoMarine scientistconsejo del mdico. Asegrese de hacerle al mdico cualquier pregunta que tenga. Document Revised: 02/08/2018 Document Reviewed: 02/08/2018 Elsevier Patient Education  2021 ElseManchesterGastritis - Adultos Gastritis, Adult  La gastritis es una hinchazn (inflamacin) del estmago. La gastritis puede desarrollarse rpidamente (aguda). Tambin puede desarrollarse lentamente con el transcurso del tiempo (crnica). Es importante recibir ayuda para estaPersonnel officer no obtiene ayudSaint [place]ena eNurse, children'suedChief Executive Officereras) en el estmAmerican Electric Powerles son las causas? Esta afeccin puede ser causada por lo siguiente:  Grmenes que llegan al estmAmerican Electric Powereber alcohol en exceso.  Los medicamentos que  toma.  Demasiado cido en el estmago.  Una enfermedad del intestino o del estmago.  Estrs.  Una reaccin alrgica.  Enfermedad de Crohn.  Algunos  tratamientos para el cncer (radiacin). A veces, se desconoce la causa de esta afeccin. Cules son los signos o los sntomas? Los sntomas de esta afeccin incluyen los siguientes:  Research scientist (life sciences).  Una sensacin de Science writer.  Ganas de vomitar (nuseas).  Vmitos.  Sensacin de estar demasiado lleno luego de comer.  Prdida de peso.  Mal aliento.  Vomitar sangre.  Sangre en las heces. Cmo se diagnostica? Esta afeccin puede diagnosticarse en funcin de lo siguiente:  Los antecedentes mdicos y sntomas.  Un examen fsico.  Estudios. Estos pueden incluir los siguientes: ? Anlisis de [place]. ? Pruebas de heces. ? Un procedimiento para observar el interior del estmago (endoscopa alta). ? Un estudio en el cual se toma una muestra de tejido para analizarlo (biopsia). Cmo se trata? El tratamiento de esta afeccin depende de la causa. Es posible que le administren lo siguiente:  Antibiticos si la afeccin es causada por grmenes.  Bloqueadores H2 y medicamentos similares, si la afeccin fue causada por una gran cantidad de cido. Siga estas indicaciones en su casa: Medicamentos  [employer] de venta libre y los recetados solamente como se lo haya indicado el mdico.  Si le recetaron un antibitico, tmelo como se lo haya indicado el mdico. No deje de tomarlo aunque comience a sentirse mejor. Comida y bebida  Haga comidas pequeas y frecuentes en lugar de comidas abundantes.  Evite los alimentos y las bebidas que intensifican los sntomas.  Beba suficiente lquido para Consulting civil engineer orina de color amarillo plido.   Consumo de alcohol  No beba alcohol si: ? El mdico le indica que no lo haga. ? Est embarazada, puede estar embarazada o est tratando de quedar  embarazada.  Si bebe alcohol: ? Limite su uso a las siguientes medidas:  De 0 a 1 medida por da para las mujeres.  De 0 a 2 medidas por da para los hombres. ? Est atento a la cantidad de alcohol que hay en las bebidas que toma. En los [place], una medida equivale a una botella de cerveza de 12oz (371ml), un vaso de vino de 5oz (158ml) o un vaso de una bebida alcohlica de alta graduacin de 1oz (25ml). Indicaciones generales  Converse con el mdico sobre maneras de Scientific laboratory technician. Puede realizar ejercicios de respiracin profunda, meditacin o yoga.  No fume ni consuma productos que contengan nicotina ni tabaco. Si necesita ayuda para dejar de fumar, consulte al mdico.  Concurra a todas las visitas de seguimiento como se lo haya indicado el mdico. Esto es importante. Comunquese con un mdico si:  Sus sntomas empeoran.  Los sntomas desaparecen y Teacher, adult education. Solicite ayuda inmediatamente si:  Vomita sangre o una sustancia parecida a los granos de [place].  La materia fecal es negra o de color rojo oscuro.  Vomita cada vez que intenta tomar lquidos.  El dolor de [place].  Tiene fiebre.  No se siente mejor luego de [employer]. Resumen  La gastritis es una hinchazn (inflamacin) del estmago.  Debe obtener ayuda para tratar esta afeccin. Si no obtiene [place], el estmago puede sangrar y pueden formarse llagas (lceras).  Esta afeccin se diagnostica mediante los antecedentes mdicos, un examen fsico o estudios.  Puede recibir tratamiento con medicamentos para los grmenes o medicamentos para bloquear la presencia de demasiada cantidad de cido Nurse, adult. Esta informacin no tiene Marine scientist el consejo del mdico. Asegrese de hacerle al mdico cualquier pregunta que tenga.  Document Revised: 11/02/2017 Document Reviewed: 11/02/2017 Elsevier Patient Education  2021 [employer].

## 2020-07-24 NOTE — Assessment & Plan Note (Signed)
BMI 34.20 on examination today. Recommend dietary modifications and monitoring with foods low in saturated fat, limited fried foods, strict monitoring of carbohydrates and sugar intake. Patient is extremely limited on ability to exercise due to degenerative disc disease, therefore, this is not an option at this time. She may benefit from a change in medication for diabetes to additionally facilitate weight loss however finances may be be a limitation factor for this. We will plan to follow-up in 3 months.

## 2020-07-24 NOTE — Progress Notes (Signed)
New Patient Office Visit  Subjective:  Patient ID: Julia Knight, female    DOB: 10-May-1950  Age: 70 y.o. MRN: [id number]  CC:  Chief Complaint  Patient presents with  . Establish Care  . Diabetes  . Hypertension  . Hyperlipidemia  . Pre-op Exam    HPI [name] is a 70 year old spanish speaking female presenting today with her daughter and son-in-law to establish care and seek medical clearance for needed back surgery.  She has received primary care services in the past.  [facility] in [place] but has recently relocated to this area and wishes to transfer at this time.  Interpreter services used for this visit to help facilitate communication.  Family is also present during the encounter to assist with communication as well and history.   Patient reports a longstanding history of degenerative disc disease in the lumbar and sacral spine. She endorses severe lower back pain with difficulty ambulating.  She reports that she has been seen recently by the orthopedic surgeon and was told that she will need spinal fusion surgery in the near future.  Patient and family reports the orthopedists have requested medical clearance for her given her history of diabetes, hypertension, and hyperlipidemia.   Chart review reveals her most recent labs were performed in October in November of last year.  At that time her hemoglobin A1c was 9.9%.  Her lipids were elevated with LDL at 170, triglycerides at 394, and HDL at 44.  She is currently taking Metformin 1000 mg twice a day, lisinopril 20 mg once a day, and Lipitor 40 mg once a day.  It is unclear if she is monitoring her blood glucose levels at home and diet.  Chart review also reveals that she has been prescribed Celexa 20 mg/day, Nexium 40 mg/day, fluticasone nasal spray 1 spray in each nostril per day, and Os-Cal daily.  She reports that she is not taking any of these medications at this  time.  She does endorse today upper abdominal pain for approximately the past 12 months that is worse when eating spicy foods or drinking soda.  She reports that it is extremely painful to the point at times she is unable to eat.  She denies reflux or heartburn symptoms in her chest.  She denies lower abdominal pain or difficulty swallowing.  Past Medical History:  Diagnosis Date  . Allergy   . Anxiety   . COVID-19 11/16/2018  . Depression   . Diabetes mellitus without complication (HCC)   . Dizziness 08/16/2017  . Gastroenteritis, infectious, presumed 08/16/2017  . GERD (gastroesophageal reflux disease)   . Hyperlipemia   . Hypertension     Past Surgical History:  Procedure Laterality Date  . APPENDECTOMY      History reviewed. No pertinent family history.  Social History   Socioeconomic History  . Marital status: Widowed    Spouse name: Not on file  . Number of children: Not on file  . Years of education: Not on file  . Highest education level: Not on file  Occupational History  . Not on file  Tobacco Use  . Smoking status: Former Smoker    Types: Cigarettes  . Smokeless tobacco: Never Used  Vaping Use  . Vaping Use: Never used  Substance and Sexual Activity  . Alcohol use: Never  . Drug use: Never  . Sexual activity: Not on file  Other Topics Concern  . Not on file  Social History  Narrative   ** Merged History Encounter **       Social Determinants of Health   Financial Resource Strain: Not on file  Food Insecurity: Not on file  Transportation Needs: Not on file  Physical Activity: Not on file  Stress: Not on file  Social Connections: Not on file  Intimate Partner Violence: Not on file    ROS Review of Systems  Constitutional: Positive for activity change and appetite change.  HENT: Negative for sore throat.   Eyes: Negative for visual disturbance.  Respiratory: Negative for cough, chest tightness and shortness of breath.   Cardiovascular: Negative  for chest pain, palpitations and leg swelling.  Gastrointestinal: Positive for abdominal pain. Negative for vomiting.  Musculoskeletal: Positive for arthralgias, back pain, gait problem and myalgias.  Neurological: Negative for dizziness, tremors, speech difficulty, weakness, light-headedness and headaches.    Objective:   Today health located to the vitals: BP 130/60   Pulse 64   Ht 5\' 1"  (1.549 m)   Wt 181 lb (82.1 kg)   SpO2 99%   BMI 34.20 kg/m   Physical Exam Vitals and nursing note reviewed.  Constitutional:      Appearance: Normal appearance. She is obese.  HENT:     Head: Normocephalic.     Right Ear: Tympanic membrane, ear canal and external ear normal.     Left Ear: Tympanic membrane, ear canal and external ear normal.     Nose: Nose normal.     Mouth/Throat:     Mouth: Mucous membranes are moist.     Pharynx: Oropharynx is clear.  Eyes:     Extraocular Movements: Extraocular movements intact.     Conjunctiva/sclera: Conjunctivae normal.     Pupils: Pupils are equal, round, and reactive to light.  Neck:     Vascular: No carotid bruit.  Cardiovascular:     Rate and Rhythm: Normal rate and regular rhythm.  No extrasystoles are present.    Chest Wall: PMI is not displaced.     Pulses: Normal pulses.          Radial pulses are 2+ on the right side and 2+ on the left side.       Popliteal pulses are 2+ on the right side and 2+ on the left side.       Dorsalis pedis pulses are 2+ on the right side and 2+ on the left side.       Posterior tibial pulses are 2+ on the right side and 2+ on the left side.     Heart sounds: Normal heart sounds.  Pulmonary:     Effort: Pulmonary effort is normal.     Breath sounds: Normal breath sounds. No decreased breath sounds, wheezing, rhonchi or rales.  Abdominal:     General: Abdomen is protuberant. Bowel sounds are normal.     Palpations: Abdomen is soft.     Tenderness: There is abdominal tenderness in the epigastric area. There  is no right CVA tenderness or left CVA tenderness. Negative signs include Murphy's sign and McBurney's sign.  Musculoskeletal:        General: Tenderness present.     Cervical back: Normal range of motion.     Lumbar back: Tenderness and bony tenderness present. Decreased range of motion.     Right lower leg: No edema.     Left lower leg: No edema.  Lymphadenopathy:     Cervical: No cervical adenopathy.  Skin:    General: Skin is warm  and dry.     Capillary Refill: Capillary refill takes less than 2 seconds.  Neurological:     General: No focal deficit present.     Mental Status: She is alert and oriented to person, place, and time.     Sensory: No sensory deficit.     Motor: Weakness present.     Coordination: Coordination normal.     Gait: Gait abnormal.     Deep Tendon Reflexes: Reflexes normal.  Psychiatric:        Mood and Affect: Mood normal.        Behavior: Behavior normal.        Thought Content: Thought content normal.        Judgment: Judgment normal.     Assessment & Plan:   Problem List Items Addressed This Visit      Cardiovascular and Mediastinum   Essential hypertension    Hypertension controlled today on 20 mg lisinopril.  Blood pressure 130/60 in office today. No cardiac arrhythmias or bruits noted.  Pulses palpable on upper and lower extremities.  No signs of lower extremity edema. We will obtain records from previous provider to evaluate for any previous cardiac work-ups. We will obtain labs today. Based on presentation in office today no overt signs that would warrant concern for surgery on the basis of hypertension. Recommend continued at home medication during pre and postop period  To ensure appropriate control.      Relevant Medications   atorvastatin (LIPITOR) 40 MG tablet   lisinopril (ZESTRIL) 20 MG tablet   Other Relevant Orders   Hemoglobin A1c   Lipid panel   CBC with Differential (Completed)   Comprehensive metabolic panel (Completed)      Digestive   GERD (gastroesophageal reflux disease)    Epigastric pain with palpable tenderness and reported increased pain with known triggers for reflux. Based on her previous records it appears that she has been on Nexium in the past however she reports she is no longer taking this medication. We will begin treatment today with pantoprazole 40 mg twice a day dosing for 7 days then decrease to once a day dosing. Information provided to patient in her native language on medication and dosing schedule. Plan to follow-up in approximately 3 months for further evaluation.      Relevant Medications   pantoprazole (PROTONIX) 40 MG tablet   Other Relevant Orders   Hemoglobin A1c   Lipid panel   CBC with Differential (Completed)   Comprehensive metabolic panel (Completed)     Endocrine   T2DM (type 2 diabetes mellitus) (HCC)    Type 2 diabetes with hypertension and known hyperlipidemia.  We will obtain a hemoglobin A1c today for dated measurements.  Her last labs are at 9.9%. Discussed with patient and family we may need to add additional medication to help get better control of her blood sugars. We will obtain previous records to evaluate for medications that have been tried in the past and historical hemoglobin A1c measurements. Clearance recommendations will be based on the lab results received. Discussed with family that uncontrolled blood sugar will delay healing and could possibly cause increased complications postoperatively and therefore blood sugars must be well controlled prior to surgery to ensure most appropriate outcome. Plan to follow-up in 3 months      Relevant Medications   atorvastatin (LIPITOR) 40 MG tablet   lisinopril (ZESTRIL) 20 MG tablet   metFORMIN (GLUCOPHAGE) 1000 MG tablet   Other Relevant Orders   Hemoglobin  A1c   Lipid panel   CBC with Differential (Completed)   Comprehensive metabolic panel (Completed)     Other   BMI 34.0-34.9,adult    BMI 34.20 on  examination today. Recommend dietary modifications and monitoring with foods low in saturated fat, limited fried foods, strict monitoring of carbohydrates and sugar intake. Patient is extremely limited on ability to exercise due to degenerative disc disease, therefore, this is not an option at this time. She may benefit from a change in medication for diabetes to additionally facilitate weight loss however finances may be be a limitation factor for this. We will plan to follow-up in 3 months.      Hyperlipidemia    History of hyperlipidemia with most recent labs in November of last year.  She is currently taking Lipitor 40 mg/day. Will obtain labs today for further evaluation and recommendations. Unable to make determination for surgical clearance until lab results have been received. Plan to follow-up in 3 months.      Relevant Medications   atorvastatin (LIPITOR) 40 MG tablet   lisinopril (ZESTRIL) 20 MG tablet   Other Relevant Orders   Hemoglobin A1c   Lipid panel   CBC with Differential (Completed)   Comprehensive metabolic panel (Completed)   Encounter for medical examination to establish care - Primary    New patient to this practice and provider. She is new to the area and in need of primary care services for surgical clearance for expected upcoming back surgery. With history of hypertension, hyperlipidemia, and diabetes we will need to ensure that all of her chronic diseases are well controlled prior to surgery to help reduce the risk of complications and delayed healing. We will plan for labs today for further evaluation and make recommendations based on lab results received. We will obtain previous records from external providers and update the record as necessary.      Relevant Orders   Hemoglobin A1c   Lipid panel   CBC with Differential (Completed)   Comprehensive metabolic panel (Completed)      Outpatient Encounter Medications as of 07/24/2020  Medication Sig  .  pantoprazole (PROTONIX) 40 MG tablet Take 1 tablet (40mg ) by mouth every morning and 1 tablet (40mg ) by mouth every evening for 7 days. THEN take only 1 tablet (40mg ) by mouth every evening.  . [DISCONTINUED] atorvastatin (LIPITOR) 40 MG tablet Take 1 tablet by mouth daily.  . [DISCONTINUED] citalopram (CELEXA) 20 MG tablet Take 1 tablet by mouth daily.  . [DISCONTINUED] esomeprazole (NEXIUM) 40 MG capsule TAKE 1 CAPSULE BY MOUTH DAILY BEFORE BREAKFAST.  . [DISCONTINUED] fluticasone (FLONASE) 50 MCG/ACT nasal spray 1 spray by Each Nare route daily.  . [DISCONTINUED] meloxicam (MOBIC) 15 MG tablet Take 1 tablet by mouth daily.  Marland Kitchen. acetaminophen (TYLENOL) 500 MG tablet Take 500 mg by mouth every 6 (six) hours as needed.  Marland Kitchen. atorvastatin (LIPITOR) 40 MG tablet Take 1 tablet (40mg ) by mouth every night at bedtime.  . calcium carbonate (OSCAL) 1500 (600 Ca) MG TABS tablet Take by mouth.  Marland Kitchen. lisinopril (ZESTRIL) 20 MG tablet Take 1 tablet (20mg ) by mouth every morning.  . metFORMIN (GLUCOPHAGE) 1000 MG tablet Take 1 tablet (1000mg ) by mouth every morning and 1 tablet (1000mg ) by mouth every evening with a meal.  . Vitamin A 2250 MCG (7500 UT) CAPS Take by mouth.  . [DISCONTINUED] CALCIUM PO Take by mouth.  . [DISCONTINUED] Glucosamine HCl (GLUCOSAMINE PO) Take by mouth.  . [DISCONTINUED] lisinopril (ZESTRIL) 20 MG  tablet Take 20 mg by mouth daily.  . [DISCONTINUED] metFORMIN (GLUCOPHAGE) 1000 MG tablet Take 1,000 mg by mouth 2 (two) times daily with a meal. She takes 1 every morning and 1/2 every evening  . [DISCONTINUED] PRESCRIPTION MEDICATION Cholesterol medication daily - she does not know the name  . [DISCONTINUED] ranitidine (ZANTAC) 150 MG tablet Take by mouth.  . [DISCONTINUED] traMADol (ULTRAM) 50 MG tablet Take 1-2 tablets (50-100 mg total) by mouth at bedtime as needed.   No facility-administered encounter medications on file as of 07/24/2020.    Follow-up: Return in about 3 months (around  10/24/2020) for DM/HTN/HLD.   Tollie Eth, NP

## 2020-07-24 NOTE — Assessment & Plan Note (Signed)
Hypertension controlled today on 20 mg lisinopril.  Blood pressure 130/60 in office today. No cardiac arrhythmias or bruits noted.  Pulses palpable on upper and lower extremities.  No signs of lower extremity edema. We will obtain records from previous provider to evaluate for any previous cardiac work-ups. We will obtain labs today. Based on presentation in office today no overt signs that would warrant concern for surgery on the basis of hypertension. Recommend continued at home medication during pre and postop period  To ensure appropriate control.

## 2020-07-27 ENCOUNTER — Telehealth (HOSPITAL_BASED_OUTPATIENT_CLINIC_OR_DEPARTMENT_OTHER): Payer: Self-pay

## 2020-07-27 DIAGNOSIS — E782 Mixed hyperlipidemia: Secondary | ICD-10-CM

## 2020-07-27 MED FILL — ATORVASTATIN CALCIUM 40 MG: 40 | 30 days supply | Qty: 30 | Fill #0

## 2020-07-27 MED FILL — PANTOPRAZOLE SOD DR 40 MG T: 40 | 30 days supply | Qty: 37 | Fill #0

## 2020-07-27 MED FILL — METFORMIN HCL 1000 MG TABS: 1000 | 30 days supply | Qty: 60 | Fill #0

## 2020-07-27 MED FILL — LISINOPRIL 20 MG TABLET: 20 | 30 days supply | Qty: 30 | Fill #0

## 2020-07-27 NOTE — Telephone Encounter (Signed)
-----   Message from Tollie Eth, NP sent at 07/27/2020  9:47 AM EDT ----- Hemoglobin A1c is improved- down to 8.2% at this time. Would like to get this less than 7% for optimal control of diabetes, especially considering significant surgery in the near future.  Cholesterol and triglycerides are elevated. Unsure if patient was fasting at the time of labs or not.   Also, signs of increased WBC, may indicate infection present, however, may be direct result of current back issues resulting from inflammation given slight elevation in lymphocytes.   Recommend continuing Metformin at 1000mg  every morning and every evening and monitoring blood sugar levels when first waking up in the morning and recording this for me to review at the next appointment.   Avoid fried foods, fatty foods, and carbohydrates. Plan to recheck lipid levels this week with FASTING labs- will need lab appointment made. Will sign off for surgery if cholesterol is improved at that time. Will need to continue to work on diabetes.   Recommend follow-up in 3 months for office visit with A1c.

## 2020-07-27 NOTE — Telephone Encounter (Signed)
Called translator services to interpret Julia Knight - ID [id number]  Pt is aware and agreeable to lab results and recommendations Pt is agreeable to fasting labs on 03/24 -- Orders are in Pt is aware and agreeable to continuing metformin at 1000 mg ma and qhs Pt is agreeable to checking blood sugar in the am and recording Pt is aware and agreeable that surgical clearance cannot be granted at this time and is contingent upon cholesterol being well controlled

## 2020-07-27 NOTE — Progress Notes (Signed)
Hemoglobin A1c is improved- down to 8.2% at this time. Would like to get this less than 7% for optimal control of diabetes, especially considering significant surgery in the near future.  Cholesterol and triglycerides are elevated. Unsure if patient was fasting at the time of labs or not.   Also, signs of increased WBC, may indicate infection present, however, may be direct result of current back issues resulting from inflammation given slight elevation in lymphocytes.   Recommend continuing Metformin at 1000mg  every morning and every evening and monitoring blood sugar levels when first waking up in the morning and recording this for me to review at the next appointment.   Avoid fried foods, fatty foods, and carbohydrates. Plan to recheck lipid levels this week with FASTING labs- will need lab appointment made. Will sign off for surgery if cholesterol is improved at that time. Will need to continue to work on diabetes.   Recommend follow-up in 3 months for office visit with A1c.

## 2020-07-30 ENCOUNTER — Other Ambulatory Visit (HOSPITAL_BASED_OUTPATIENT_CLINIC_OR_DEPARTMENT_OTHER)
Admission: RE | Admit: 2020-07-30 | Discharge: 2020-07-30 | Disposition: A | Discharge status: Home / Self Care | Payer: Self-pay | Source: Ambulatory Visit | Attending: Nurse Practitioner | Admitting: Nurse Practitioner

## 2020-07-30 ENCOUNTER — Other Ambulatory Visit: Payer: Self-pay

## 2020-07-30 DIAGNOSIS — E782 Mixed hyperlipidemia: Secondary | ICD-10-CM | POA: Insufficient documentation

## 2020-07-30 LAB — LIPID PANEL
Cholesterol: 204 mg/dL — ABNORMAL HIGH (ref 0–200)
HDL: 47 mg/dL (ref >40)
LDL Cholesterol: 114 mg/dL — ABNORMAL HIGH (ref 0–99)
Total CHOL/HDL Ratio: 4.3 RATIO
Triglycerides: 214 mg/dL — ABNORMAL HIGH (ref <150)
VLDL: 43 mg/dL — ABNORMAL HIGH (ref 0–40)

## 2020-07-30 NOTE — Progress Notes (Signed)
Significant improvement with triglycerides with fasting labs. OK to proceed with surgery. Will send form to provider. Please let patient know.

## 2020-07-31 ENCOUNTER — Telehealth (HOSPITAL_BASED_OUTPATIENT_CLINIC_OR_DEPARTMENT_OTHER): Payer: Self-pay | Admitting: Nurse Practitioner

## 2020-07-31 NOTE — Telephone Encounter (Signed)
Surgical clearance paperwork completed and letter created for [facility]. To be faxed to provider for clearance.

## 2020-08-05 ENCOUNTER — Ambulatory Visit (INDEPENDENT_AMBULATORY_CARE_PROVIDER_SITE_OTHER): Payer: Self-pay | Admitting: Specialist

## 2020-08-05 ENCOUNTER — Encounter: Payer: Self-pay | Admitting: Specialist

## 2020-08-05 ENCOUNTER — Telehealth: Payer: Self-pay | Admitting: Specialist

## 2020-08-05 VITALS — BP 147/71 | HR 83 | Ht 61.0 in | Wt 181.0 lb

## 2020-08-05 DIAGNOSIS — M48062 Spinal stenosis, lumbar region with neurogenic claudication: Secondary | ICD-10-CM

## 2020-08-05 DIAGNOSIS — M4316 Spondylolisthesis, lumbar region: Secondary | ICD-10-CM

## 2020-08-05 NOTE — Telephone Encounter (Signed)
[facility] states the clearance letter for this pt is under letter.

## 2020-08-05 NOTE — Progress Notes (Addendum)
Office Visit Note   Patient: Julia Knight           Date of Birth: June 19, 1950           MRN: [id number] Visit Date: 08/05/2020              Requested by: No referring provider defined for this encounter. PCP: Tollie Eth, NP   Assessment & Plan: Visit Diagnoses:  1. Spondylolisthesis, lumbar region   2. Spinal stenosis of lumbar region with neurogenic claudication     Plan: Avoid bending, stooping and avoid lifting weights greater than 10 lbs. Avoid prolong standing and walking. Order for a new walker with wheels. Surgery scheduling secretary Tivis Ringer, will call you in the next week to schedule for surgery.  Surgery recommended is a Four level lumbar decompressive laminectomy L1-2, L2-3, L3-4 and L4-5 with  two level lumbar fusion L3-4 and L4-5 this would be done with rods, screws and cages with local bone graft and allograft (donor bone graft),vivigen. Take hydrocodone for for pain. Risk of surgery includes risk of infection 1 in 200 patients, bleeding 1/2% chance you would need a transfusion.   Risk to the nerves is one in 10,000. You will need to use a brace for 3 months and wean from the brace on the 4th month. Expect improved walking and standing tolerance. Expect relief of leg pain but numbness may persist depending on the length and degree of pressure that has been present.    Follow-Up Instructions: No follow-ups on file.   Follow-Up Instructions: No follow-ups on file.   Orders:  No orders of the defined types were placed in this encounter.  No orders of the defined types were placed in this encounter.     Procedures: No procedures performed   Clinical Data: Findings:  Narrative & Impression CLINICAL DATA:  Low back pain with radiation down bilateral legs with numbness for 3 years.  EXAM: MRI LUMBAR SPINE WITHOUT CONTRAST  TECHNIQUE: Multiplanar, multisequence MR imaging of the lumbar spine was performed. No intravenous  contrast was administered.  COMPARISON:  None.  FINDINGS: Segmentation:  Standard.  Alignment:  Grade 1 anterolisthesis of L3 on L4.  Vertebrae:  No fracture, evidence of discitis, or bone lesion.  Conus medullaris and cauda equina: Conus extends to the T12-L1 level. Conus and cauda equina appear normal.  Paraspinal and other soft tissues: No acute paraspinal abnormality.  Other: Mild osteoarthritis of the SI joints.  Disc levels:  Disc spaces: Degenerative disease with disc height loss throughout the lumbar spine most severe at L1-2 and L2-3.  T12-L1: Broad-based disc bulge. Moderate bilateral facet arthropathy. Right lateral recess stenosis. Mild spinal stenosis. Mild right foraminal stenosis. No left foraminal stenosis.  L1-L2: Broad-based disc bulge flattening the ventral thecal sac eccentric towards the left. Mild bilateral facet arthropathy. Severe spinal stenosis. Mild left foraminal stenosis. Moderate right foraminal stenosis.  L2-L3: Broad-based disc bulge flattening the ventral thecal sac. Severe bilateral facet arthropathy. Severe spinal stenosis. Severe left and mild right foraminal stenosis.  L3-L4: Broad-based disc bulge with a left paracentral disc protrusion. Severe bilateral facet arthropathy. Severe spinal stenosis. Severe left and mild right foraminal stenosis.  L4-L5: Broad-based disc bulge. Severe bilateral facet arthropathy. Severe spinal stenosis. Moderate bilateral foraminal stenosis, right greater than left.  L5-S1: No significant disc bulge. No evidence of neural foraminal stenosis. No central canal stenosis.  IMPRESSION: 1. At L1-2 there is a broad-based disc bulge flattening the ventral thecal sac eccentric  towards the left. Mild bilateral facet arthropathy. Severe spinal stenosis. 2. At L2-3 there is a broad-based disc bulge flattening the ventral thecal sac. Severe bilateral facet arthropathy. Severe spinal stenosis.  Severe left and mild right foraminal stenosis. 3. At L3-4 there is a broad-based disc bulge with a left paracentral disc protrusion. Severe bilateral facet arthropathy. Severe spinal stenosis. 4. At L4-5 there is a broad-based disc bulge. Moderate bilateral foraminal stenosis, right greater than left.   Electronically Signed   By: Elige Ko   On: 04/11/2020 10:40       Subjective: Chief Complaint  Patient presents with  . Lower Back - Follow-up    Pending surgery-- back is doing the same, as time goes her feet are getting worse, she is having bilat leg pain, with right being worse than left    70 year old female with history of lower extremity numbness and weakness. She is having difficulty standing and walking.MRI with severe spinal stenosis. Her HgbA1c is elevated and Last chech is a HgbA1c of 8.4 decreased from check several month previously 10 or greater.  She is concerned that the weakness and numbness in the legs right greater than left is getting worse.    Review of Systems  Constitutional: Negative.   HENT: Negative.   Eyes: Negative.   Respiratory: Negative.   Cardiovascular: Negative.   Gastrointestinal: Negative.   Endocrine: Negative.   Genitourinary: Negative.   Musculoskeletal: Negative.   Skin: Negative.   Allergic/Immunologic: Negative.   Neurological: Negative.   Hematological: Negative.   Psychiatric/Behavioral: Negative.      Objective: Vital Signs: BP (!) 147/71 (BP Location: Left Arm, Patient Position: Sitting)   Pulse 83   Ht 5\' 1"  (1.549 m)   Wt 181 lb (82.1 kg)   BMI 34.20 kg/m   Physical Exam Constitutional:      Appearance: She is well-developed.  HENT:     Head: Normocephalic and atraumatic.  Eyes:     Pupils: Pupils are equal, round, and reactive to light.  Pulmonary:     Effort: Pulmonary effort is normal.     Breath sounds: Normal breath sounds.  Abdominal:     General: Bowel sounds are normal.     Palpations:  Abdomen is soft.  Musculoskeletal:     Cervical back: Normal range of motion and neck supple.     Lumbar back: Negative right straight leg raise test and negative left straight leg raise test.  Skin:    General: Skin is warm and dry.  Neurological:     Mental Status: She is alert and oriented to person, place, and time.  Psychiatric:        Behavior: Behavior normal.        Thought Content: Thought content normal.        Judgment: Judgment normal.     Back Exam   Tenderness  The patient is experiencing tenderness in the lumbar.  Range of Motion  Extension: abnormal  Flexion: normal  Lateral bend right: abnormal  Lateral bend left: abnormal  Rotation right: abnormal  Rotation left: abnormal   Muscle Strength  Right Quadriceps:  5/5  Left Quadriceps:  5/5  Right Hamstrings:  5/5  Left Hamstrings:  5/5   Tests  Straight leg raise right: negative Straight leg raise left: negative  Reflexes  Patellar: 0/4 Achilles: 0/4 Biceps: 0/4 Babinski's sign: normal   Comments:  Weakness right leg foot DF.       Specialty Comments:  No specialty comments available.  Imaging: No results found.   PMFS History: Patient Active Problem List   Diagnosis Date Noted  . Hyperlipidemia 07/24/2020  . Encounter for medical examination to establish care 07/24/2020  . BMI 34.0-34.9,adult 05/22/2020  . Spondylolisthesis at L3-L4 level 05/22/2020  . Anxiety 06/07/2018  . GERD (gastroesophageal reflux disease) 06/03/2013  . Left-sided low back pain with sciatica 10/09/2012  . Left leg pain 08/14/2012  . Depression 03/08/2012  . Allergic rhinitis 08/24/2011  . Essential hypertension 08/24/2011  . T2DM (type 2 diabetes mellitus) (HCC) 02/28/2011   Past Medical History:  Diagnosis Date  . Allergy   . Anxiety   . COVID-19 11/16/2018  . Depression   . Diabetes mellitus without complication (HCC)   . Dizziness 08/16/2017  . Gastroenteritis, infectious, presumed 08/16/2017  .  GERD (gastroesophageal reflux disease)   . Hyperlipemia   . Hypertension     No family history on file.  Past Surgical History:  Procedure Laterality Date  . APPENDECTOMY     Social History   Occupational History  . Not on file  Tobacco Use  . Smoking status: Former Smoker    Types: Cigarettes  . Smokeless tobacco: Never Used  Vaping Use  . Vaping Use: Never used  Substance and Sexual Activity  . Alcohol use: Never  . Drug use: Never  . Sexual activity: Not on file

## 2020-08-05 NOTE — Patient Instructions (Signed)
Plan: Avoid bending, stooping and avoid lifting weights greater than 10 lbs. Avoid prolong standing and walking. Order for a new walker with wheels. Surgery scheduling secretary Tivis Ringer, will call you in the next week to schedule for surgery.  Surgery recommended is a Four level lumbar decompressive laminectomy L1-2, L2-3, L3-4 and L4-5 with  two level lumbar fusion L3-4 and L4-5 this would be done with rods, screws and cages with local bone graft and allograft (donor bone graft),vivigen. Take hydrocodone for for pain. Risk of surgery includes risk of infection 1 in 200 patients, bleeding 1/2% chance you would need a transfusion.   Risk to the nerves is one in 10,000. You will need to use a brace for 3 months and wean from the brace on the 4th month. Expect improved walking and standing tolerance. Expect relief of leg pain but numbness may persist depending on the length and degree of pressure that has been present.

## 2020-08-19 ENCOUNTER — Ambulatory Visit: Payer: Self-pay | Admitting: Family Medicine

## 2020-08-26 ENCOUNTER — Encounter: Payer: Self-pay | Admitting: Surgery

## 2020-08-26 ENCOUNTER — Ambulatory Visit (INDEPENDENT_AMBULATORY_CARE_PROVIDER_SITE_OTHER): Payer: Self-pay | Admitting: Surgery

## 2020-08-26 VITALS — BP 146/67 | HR 71 | Ht 61.0 in | Wt 181.0 lb

## 2020-08-26 DIAGNOSIS — M48062 Spinal stenosis, lumbar region with neurogenic claudication: Secondary | ICD-10-CM

## 2020-08-26 NOTE — Progress Notes (Signed)
70 year old Hispanic female with known history of L1-L5 stenosis/HNP comes in for preop evaluation.  She continues to have ongoing symptoms that are unchanged from previous visit.  She is wanting to proceed with CENTRAL LAMINECTOMY L1-2, L2-3, L3-4 AND L4-5, TRANSFORAMINAL LUMBAR INTERBODY FUSION LEFT L3-4 AND L4-5 WITH PEDICLE SCREWS, RODS, CAGES, LOCAL AND ALLOGRAFT BONE GRAFT, VIVIGEN.  Today he is doing physical performed.  Review of systems negative.  Patient comes in with daughter and interpreter.  Today surgical procedure discussed in detail.  All questions answered.

## 2020-08-27 NOTE — Progress Notes (Signed)
Surgical Instructions    Your procedure is scheduled on 09/01/20.  Report to [facility] Main Entrance "A" at 05:45 A.M., then check in with the Admitting office.  Call this number if you have problems the morning of surgery:  [phone]   If you have any questions prior to your surgery date call [phone]: Open Monday-Friday 8am-4pm    Remember:  Do not eat after midnight the night before your surgery  You may drink clear liquids until 04:45am the morning of your surgery.   Clear liquids allowed are: Water, Non-Citrus Juices (without pulp), Carbonated Beverages, Clear Tea, Black Coffee Only, and Gatorade  Patient Instructions  . The day of surgery (if you have diabetes): o Drink ONE (1) 10 oz water bottle given to you in your pre admission testing appointment by 04:45am the morning of surgery. Drink in one sitting. Do not sip.  o This drink was given to you during your hospital  pre-op appointment visit.  o Nothing else to drink after completing the  10 oz bottle of water.          If you have questions, please contact your surgeon's office.     Take these medicines the morning of surgery with A SIP OF WATER  acetaminophen (TYLENOL)  If needed pantoprazole (PROTONIX)    As of today, STOP taking any Aspirin (unless otherwise instructed by your surgeon) Aleve, Naproxen, Ibuprofen, Motrin, Advil, Goody's, BC's, all herbal medications, fish oil, and all vitamins.  WHAT DO I DO ABOUT MY DIABETES MEDICATION?   Marland Kitchen Do not take oral diabetes medicines (pills) the morning of surgery. Do not take metFORMIN (GLUCOPHAGE) the morning of surgery.    . The day of surgery, do not take other diabetes injectables, including Byetta (exenatide), Bydureon (exenatide ER), Victoza (liraglutide), or Trulicity (dulaglutide).  . If your CBG is greater than 220 mg/dL, you may take  of your sliding scale (correction) dose of insulin.   HOW TO MANAGE YOUR DIABETES BEFORE AND AFTER  SURGERY  Why is it important to control my blood sugar before and after surgery? . Improving blood sugar levels before and after surgery helps healing and can limit problems. . A way of improving blood sugar control is eating a healthy diet by: o  Eating less sugar and carbohydrates o  Increasing activity/exercise o  Talking with your doctor about reaching your blood sugar goals . High blood sugars (greater than 180 mg/dL) can raise your risk of infections and slow your recovery, so you will need to focus on controlling your diabetes during the weeks before surgery. . Make sure that the doctor who takes care of your diabetes knows about your planned surgery including the date and location.  How do I manage my blood sugar before surgery? . Check your blood sugar at least 4 times a day, starting 2 days before surgery, to make sure that the level is not too high or low. . Check your blood sugar the morning of your surgery when you wake up and every 2 hours until you get to the Short Stay unit. o If your blood sugar is less than 70 mg/dL, you will need to treat for low blood sugar: - Do not take insulin. - Treat a low blood sugar (less than 70 mg/dL) with  cup of clear juice (cranberry or apple), 4 glucose tablets, OR glucose gel. - Recheck blood sugar in 15 minutes after treatment (to make sure it is greater than 70 mg/dL). If your blood  sugar is not greater than 70 mg/dL on recheck, call [phone] for further instructions. . Report your blood sugar to the short stay nurse when you get to Short Stay.  . If you are admitted to the hospital after surgery: o Your blood sugar will be checked by the staff and you will probably be given insulin after surgery (instead of oral diabetes medicines) to make sure you have good blood sugar levels. o The goal for blood sugar control after surgery is 80-180 mg/dL.                      Do not wear jewelry, make up, or nail polish            Do not wear  lotions, powders, perfumes/colognes, or deodorant.            Do not shave 48 hours prior to surgery.              Do not bring valuables to the hospital.            [facility] is not responsible for any belongings or valuables.  Do NOT Smoke (Tobacco/Vaping) or drink Alcohol 24 hours prior to your procedure If you use a CPAP at night, you may bring all equipment for your overnight stay.   Contacts, glasses, dentures or bridgework may not be worn into surgery, please bring cases for these belongings   For patients admitted to the hospital, discharge time will be determined by your treatment team.   Patients discharged the day of surgery will not be allowed to drive home, and someone needs to stay with them for 24 hours.    Special instructions:   [facility]- Preparing For Surgery  Before surgery, you can play an important role. Because skin is not sterile, your skin needs to be as free of germs as possible. You can reduce the number of germs on your skin by washing with CHG (chlorahexidine gluconate) Soap before surgery.  CHG is an antiseptic cleaner which kills germs and bonds with the skin to continue killing germs even after washing.    Oral Hygiene is also important to reduce your risk of infection.  Remember - BRUSH YOUR TEETH THE MORNING OF SURGERY WITH YOUR REGULAR TOOTHPASTE  Please do not use if you have an allergy to CHG or antibacterial soaps. If your skin becomes reddened/irritated stop using the CHG.  Do not shave (including legs and underarms) for at least 48 hours prior to first CHG shower. It is OK to shave your face.  Please follow these instructions carefully.   1. Shower the NIGHT BEFORE SURGERY and the MORNING OF SURGERY  2. If you chose to wash your hair, wash your hair first as usual with your normal shampoo.  3. After you shampoo, rinse your hair and body thoroughly to remove the shampoo.  4. Wash Face and genitals (private parts) with your normal soap.    5.  Shower the NIGHT BEFORE SURGERY and the MORNING OF SURGERY with CHG Soap.   6. Use CHG Soap as you would any other liquid soap. You can apply CHG directly to the skin and wash gently with a scrungie or a clean washcloth.   7. Apply the CHG Soap to your body ONLY FROM THE NECK DOWN.  Do not use on open wounds or open sores. Avoid contact with your eyes, ears, mouth and genitals (private parts). Wash Face and genitals (private parts)  with your  normal soap.   8. Wash thoroughly, paying special attention to the area where your surgery will be performed.  9. Thoroughly rinse your body with warm water from the neck down.  10. DO NOT shower/wash with your normal soap after using and rinsing off the CHG Soap.  11. Pat yourself dry with a CLEAN TOWEL.  12. Wear CLEAN PAJAMAS to bed the night before surgery  13. Place CLEAN SHEETS on your bed the night before your surgery  14. DO NOT SLEEP WITH PETS.   Day of Surgery: Take a shower with CHG soap. Wear Clean/Comfortable clothing the morning of surgery Do not apply any deodorants/lotions.   Remember to brush your teeth WITH YOUR REGULAR TOOTHPASTE.   Please read over the following fact sheets that you were given.

## 2020-08-28 ENCOUNTER — Ambulatory Visit (HOSPITAL_COMMUNITY)
Admission: RE | Admit: 2020-08-28 | Discharge: 2020-08-28 | Disposition: A | Discharge status: Home / Self Care | Payer: Self-pay | Source: Ambulatory Visit | Attending: Surgery | Admitting: Surgery

## 2020-08-28 ENCOUNTER — Encounter (HOSPITAL_COMMUNITY): Payer: Self-pay

## 2020-08-28 ENCOUNTER — Other Ambulatory Visit: Payer: Self-pay

## 2020-08-28 ENCOUNTER — Encounter (HOSPITAL_COMMUNITY)
Admission: RE | Admit: 2020-08-28 | Discharge: 2020-08-28 | Disposition: A | Discharge status: Home / Self Care | Payer: Self-pay | Source: Ambulatory Visit | Attending: Specialist | Admitting: Specialist

## 2020-08-28 DIAGNOSIS — Z01818 Encounter for other preprocedural examination: Secondary | ICD-10-CM | POA: Insufficient documentation

## 2020-08-28 DIAGNOSIS — Z20822 Contact with and (suspected) exposure to covid-19: Secondary | ICD-10-CM | POA: Insufficient documentation

## 2020-08-28 HISTORY — DX: Unspecified osteoarthritis, unspecified site: M19.90

## 2020-08-28 HISTORY — DX: Myoneural disorder, unspecified: G70.9

## 2020-08-28 LAB — URINALYSIS, ROUTINE W REFLEX MICROSCOPIC
Bilirubin Urine: NEGATIVE
Glucose, UA: NEGATIVE mg/dL
Hgb urine dipstick: NEGATIVE
Ketones, ur: NEGATIVE mg/dL
Nitrite: NEGATIVE
Protein, ur: NEGATIVE mg/dL
Specific Gravity, Urine: 1.004 — ABNORMAL LOW (ref 1.005–1.030)
pH: 5 (ref 5.0–8.0)

## 2020-08-28 LAB — SURGICAL PCR SCREEN
MRSA, PCR: NEGATIVE
Staphylococcus aureus: NEGATIVE

## 2020-08-28 LAB — COMPREHENSIVE METABOLIC PANEL
ALT: 16 U/L (ref 0–44)
AST: 13 U/L — ABNORMAL LOW (ref 15–41)
Albumin: 4.3 g/dL (ref 3.5–5.0)
Alkaline Phosphatase: 124 U/L (ref 38–126)
Anion gap: 8 (ref 5–15)
BUN: 20 mg/dL (ref 8–23)
CO2: 25 mmol/L (ref 22–32)
Calcium: 9.8 mg/dL (ref 8.9–10.3)
Chloride: 103 mmol/L (ref 98–111)
Creatinine, Ser: 0.9 mg/dL (ref 0.44–1.00)
GFR, Estimated: >60 mL/min (ref >60)
Glucose, Bld: 131 mg/dL — ABNORMAL HIGH (ref 70–99)
Potassium: 4.6 mmol/L (ref 3.5–5.1)
Sodium: 136 mmol/L (ref 135–145)
Total Bilirubin: 0.5 mg/dL (ref 0.3–1.2)
Total Protein: 7.7 g/dL (ref 6.5–8.1)

## 2020-08-28 LAB — CBC
HCT: 34.1 % — ABNORMAL LOW (ref 36.0–46.0)
Hemoglobin: 11.1 g/dL — ABNORMAL LOW (ref 12.0–15.0)
MCH: 29.1 pg (ref 26.0–34.0)
MCHC: 32.6 g/dL (ref 30.0–36.0)
MCV: 89.3 fL (ref 80.0–100.0)
Platelets: 270 10*3/uL (ref 150–400)
RBC: 3.82 MIL/uL — ABNORMAL LOW (ref 3.87–5.11)
RDW: 12.5 % (ref 11.5–15.5)
WBC: 11.7 10*3/uL — ABNORMAL HIGH (ref 4.0–10.5)
nRBC: 0 % (ref 0.0–0.2)

## 2020-08-28 LAB — TYPE AND SCREEN
ABO/RH(D): O POS
Antibody Screen: NEGATIVE

## 2020-08-28 LAB — PROTIME-INR
INR: 1 (ref 0.8–1.2)
Prothrombin Time: 13 seconds (ref 11.4–15.2)

## 2020-08-28 LAB — GLUCOSE, CAPILLARY: Glucose-Capillary: 130 mg/dL — ABNORMAL HIGH (ref 70–99)

## 2020-08-28 LAB — SARS CORONAVIRUS 2 (TAT 6-24 HRS): SARS Coronavirus 2: NEGATIVE

## 2020-08-28 NOTE — Progress Notes (Signed)
PCP - Enid Skeens, NP Cardiologist - Lalla Brothers at [facility]  PPM/ICD - denies   Chest x-ray - 08/28/20 EKG - 08/28/20 Stress Test - denies ECHO - denies Cardiac Cath - denies  Sleep Study - denies    Does not own a glucometer and does not check CBGs at home  Patient instructed to hold all Aspirin, NSAID's, herbal medications, fish oil and vitamins 7 days prior to surgery.   ERAS Protcol -yes PRE-SURGERY Ensure or G2- water given  COVID TEST- 08/28/20   Anesthesia review: yes, A1C 8.2 07/24/20  Patient denies shortness of breath, fever, cough and chest pain at PAT appointment   All instructions explained to the patient, with a verbal understanding of the material. Patient agrees to go over the instructions while at home for a better understanding. Patient also instructed to self quarantine after being tested for COVID-19. The opportunity to ask questions was provided.

## 2020-08-31 NOTE — Anesthesia Preprocedure Evaluation (Addendum)
Anesthesia Evaluation  Patient identified by MRN, date of birth, ID band Patient awake    Reviewed: Allergy & Precautions, NPO status , Patient's Chart, lab work & pertinent test results  History of Anesthesia Complications Negative for: history of anesthetic complications  Airway Mallampati: II  TM Distance: >3 FB Neck ROM: Full    Dental  (+) Loose,    Pulmonary Current Smoker,    Pulmonary exam normal        Cardiovascular hypertension, Normal cardiovascular exam     Neuro/Psych negative neurological ROS     GI/Hepatic Neg liver ROS, GERD  ,  Endo/Other  diabetes, Poorly Controlled, Type 2, Oral Hypoglycemic Agents  Renal/GU negative Renal ROS  negative genitourinary   Musculoskeletal negative musculoskeletal ROS (+)   Abdominal   Peds  Hematology negative hematology ROS (+)   Anesthesia Other Findings   Reproductive/Obstetrics                          Anesthesia Physical Anesthesia Plan  ASA: III  Anesthesia Plan: General   Post-op Pain Management:    Induction: Intravenous  PONV Risk Score and Plan: 2 and Ondansetron, Dexamethasone, Treatment may vary due to age or medical condition and Midazolam  Airway Management Planned: Oral ETT  Additional Equipment: None  Intra-op Plan:   Post-operative Plan: Extubation in OR  Informed Consent: I have reviewed the patients History and Physical, chart, labs and discussed the procedure including the risks, benefits and alternatives for the proposed anesthesia with the patient or authorized representative who has indicated his/her understanding and acceptance.     Dental advisory given  Plan Discussed with:   Anesthesia Plan Comments: (PAT note written 08/31/2020 by Shonna Chock, PA-C.  )       Anesthesia Quick Evaluation

## 2020-08-31 NOTE — Progress Notes (Signed)
Anesthesia Chart Review:  Case: [id number] Date/Time: 09/01/20 0730   Procedure: CENTRAL LAMINECTOMY L1-2, L2-3, L3-4 AND L4-5, TRANSFORAMINAL LUMBAR INTERBODY FUSION LEFT L3-4 AND L4-5 WITH PEDICLE SCREWS, RODS, CAGES, LOCAL AND ALLOGRAFT BONE GRAFT, VIVIGEN (N/A )   Anesthesia type: General   Pre-op diagnosis: lumbar spinal stenosis L1-2, L2-3, L3-4 and L4-5, lumbar spondylolisthesis L3-4 and L4-5   [facility] ROOM 05 / [facility]   Surgeons: Kerrin Champagne, MD      DISCUSSION: Patient is a 70 year old female scheduled for the above procedure.   History includes smoking, HTN, HLD, GERD, DM2, COVID-19 (11/2018).   A1c 8.2% 07/24/20. She does not do CBGs at home. Dr. Otelia Sergeant last saw patient on 08/05/20 and notes A1c result, down from 9.9% on 03/10/20 [facility]). Weakness/numbness in her legs progressive and having difficulty standing/walking, so surgery scheduled. Glucose with PAT labs was 131.  PCP Early, Sung Amabile, NP also complete a preoperative evaluation letter (scanned under Media tab) following improvement in A1c results. She wrote, "... Based on physical examination and lab results, no cardiac or medical factors have been identified to prevent patient from undergoing the listed surgical procedure..."  By [facility] records, she had a normal EKG on 03/10/20 but copy has not yet been received. 08/28/20 COVID-19 test negative. Anesthesia team to evaluate on the day of surgery.   VS: BP 138/73   Pulse 88   Temp 36.8 C (Oral)   Resp 18   Ht 5\' 3"  (1.6 m)   Wt 78.6 kg   SpO2 99%   BMI 30.68 kg/m    PROVIDERS: , NP is PCP (established 07/24/20), previously seeing [place], 07/26/20, PA-C with [facility].   LABS: Labs reviewed: Acceptable for surgery. See DISCUSSION. (all labs ordered are listed, but only abnormal results are displayed)  Labs Reviewed  GLUCOSE, CAPILLARY - Abnormal; Notable for the following components:      Result Value   Glucose-Capillary 130 (*)    All other  components within normal limits  CBC - Abnormal; Notable for the following components:   WBC 11.7 (*)    RBC 3.82 (*)    Hemoglobin 11.1 (*)    HCT 34.1 (*)    All other components within normal limits  COMPREHENSIVE METABOLIC PANEL - Abnormal; Notable for the following components:   Glucose, Bld 131 (*)    AST 13 (*)    All other components within normal limits  URINALYSIS, ROUTINE W REFLEX MICROSCOPIC - Abnormal; Notable for the following components:   Color, Urine COLORLESS (*)    Specific Gravity, Urine 1.004 (*)    Leukocytes,Ua SMALL (*)    Bacteria, UA RARE (*)    All other components within normal limits  SARS CORONAVIRUS 2 (TAT 6-24 HRS)  SURGICAL PCR SCREEN  PROTIME-INR  TYPE AND SCREEN     IMAGES: CXR 08/28/20: FINDINGS: Lungs are clear. No airspace disease or lung consolidation. Sclerosis involving the anterior left first rib. Heart size is normal. Degenerative changes in the lower thoracic spine and upper lumbar spine. No large pleural effusions. IMPRESSION: No active cardiopulmonary disease.  MRI L-spine 04/10/20: IMPRESSION: 1. At L1-2 there is a broad-based disc bulge flattening the ventral thecal sac eccentric towards the left. Mild bilateral facet arthropathy. Severe spinal stenosis. 2. At L2-3 there is a broad-based disc bulge flattening the ventral thecal sac. Severe bilateral facet arthropathy. Severe spinal stenosis. Severe left and mild right foraminal stenosis. 3. At L3-4 there is  a broad-based disc bulge with a left paracentral disc protrusion. Severe bilateral facet arthropathy. Severe spinal stenosis. 4. At L4-5 there is a broad-based disc bulge. Moderate bilateral foraminal stenosis, right greater than left.   EKG: 03/10/20 EKG showed SR, Normal ECG by Narrative. Copy requested from [facility].   CV: N/A  Past Medical History:  Diagnosis Date  . Allergy   . Anxiety   . Arthritis    back and hands  .  COVID-19 11/16/2018  . Depression   . Diabetes mellitus without complication (HCC)    type 2  . Dizziness 08/16/2017  . Gastroenteritis, infectious, presumed 08/16/2017  . GERD (gastroesophageal reflux disease)   . Hyperlipemia   . Hypertension   . Neuromuscular disorder [facility])     Past Surgical History:  Procedure Laterality Date  . APPENDECTOMY    . CHOLECYSTECTOMY    . TONSILLECTOMY     removed as an adult    MEDICATIONS: . acetaminophen (TYLENOL) 500 MG tablet  . atorvastatin (LIPITOR) 40 MG tablet  . calcium carbonate (OSCAL) 1500 (600 Ca) MG TABS tablet  . ibuprofen (ADVIL) 200 MG tablet  . lisinopril (ZESTRIL) 20 MG tablet  . metFORMIN (GLUCOPHAGE) 1000 MG tablet  . Multiple Vitamin (MULTIVITAMIN WITH MINERALS) TABS tablet  . pantoprazole (PROTONIX) 40 MG tablet   No current facility-administered medications for this encounter.    Shonna Chock, PA-C Surgical Short Stay/Anesthesiology [facility] Phone [phone] [facility] Phone [phone] 08/31/2020 12:37 PM

## 2020-09-01 ENCOUNTER — Inpatient Hospital Stay (HOSPITAL_COMMUNITY): Payer: Self-pay

## 2020-09-01 ENCOUNTER — Encounter (HOSPITAL_COMMUNITY): Payer: Self-pay | Admitting: Specialist

## 2020-09-01 ENCOUNTER — Inpatient Hospital Stay (HOSPITAL_COMMUNITY)
Admission: RE | Admit: 2020-09-01 | Discharge: 2020-09-05 | DRG: 460 | Disposition: A | Discharge status: Home / Self Care | Payer: Self-pay | Attending: Specialist | Admitting: Specialist

## 2020-09-01 ENCOUNTER — Encounter (HOSPITAL_COMMUNITY)
Admission: RE | Disposition: A | Discharge status: Home / Self Care | Payer: Self-pay | Source: Home / Self Care | Attending: Specialist

## 2020-09-01 ENCOUNTER — Inpatient Hospital Stay (HOSPITAL_COMMUNITY): Payer: Self-pay | Admitting: Vascular Surgery

## 2020-09-01 ENCOUNTER — Other Ambulatory Visit: Payer: Self-pay

## 2020-09-01 ENCOUNTER — Inpatient Hospital Stay (HOSPITAL_COMMUNITY): Payer: Self-pay | Admitting: Anesthesiology

## 2020-09-01 DIAGNOSIS — I10 Essential (primary) hypertension: Secondary | ICD-10-CM | POA: Diagnosis present

## 2020-09-01 DIAGNOSIS — M48062 Spinal stenosis, lumbar region with neurogenic claudication: Principal | ICD-10-CM

## 2020-09-01 DIAGNOSIS — Z981 Arthrodesis status: Secondary | ICD-10-CM

## 2020-09-01 DIAGNOSIS — Z6834 Body mass index (BMI) 34.0-34.9, adult: Secondary | ICD-10-CM

## 2020-09-01 DIAGNOSIS — Z8616 Personal history of COVID-19: Secondary | ICD-10-CM

## 2020-09-01 DIAGNOSIS — Z79899 Other long term (current) drug therapy: Secondary | ICD-10-CM

## 2020-09-01 DIAGNOSIS — M4316 Spondylolisthesis, lumbar region: Secondary | ICD-10-CM

## 2020-09-01 DIAGNOSIS — M19041 Primary osteoarthritis, right hand: Secondary | ICD-10-CM | POA: Diagnosis present

## 2020-09-01 DIAGNOSIS — E785 Hyperlipidemia, unspecified: Secondary | ICD-10-CM | POA: Diagnosis present

## 2020-09-01 DIAGNOSIS — F17210 Nicotine dependence, cigarettes, uncomplicated: Secondary | ICD-10-CM | POA: Diagnosis present

## 2020-09-01 DIAGNOSIS — K219 Gastro-esophageal reflux disease without esophagitis: Secondary | ICD-10-CM | POA: Diagnosis present

## 2020-09-01 DIAGNOSIS — M479 Spondylosis, unspecified: Secondary | ICD-10-CM | POA: Diagnosis present

## 2020-09-01 DIAGNOSIS — M5416 Radiculopathy, lumbar region: Secondary | ICD-10-CM | POA: Diagnosis present

## 2020-09-01 DIAGNOSIS — F419 Anxiety disorder, unspecified: Secondary | ICD-10-CM

## 2020-09-01 DIAGNOSIS — Z7984 Long term (current) use of oral hypoglycemic drugs: Secondary | ICD-10-CM

## 2020-09-01 DIAGNOSIS — M19042 Primary osteoarthritis, left hand: Secondary | ICD-10-CM | POA: Diagnosis present

## 2020-09-01 DIAGNOSIS — Z419 Encounter for procedure for purposes other than remedying health state, unspecified: Secondary | ICD-10-CM

## 2020-09-01 DIAGNOSIS — D62 Acute posthemorrhagic anemia: Secondary | ICD-10-CM | POA: Diagnosis not present

## 2020-09-01 DIAGNOSIS — E782 Mixed hyperlipidemia: Secondary | ICD-10-CM

## 2020-09-01 DIAGNOSIS — E119 Type 2 diabetes mellitus without complications: Secondary | ICD-10-CM | POA: Diagnosis present

## 2020-09-01 DIAGNOSIS — M5440 Lumbago with sciatica, unspecified side: Secondary | ICD-10-CM

## 2020-09-01 DIAGNOSIS — M79605 Pain in left leg: Secondary | ICD-10-CM

## 2020-09-01 DIAGNOSIS — Z9049 Acquired absence of other specified parts of digestive tract: Secondary | ICD-10-CM

## 2020-09-01 DIAGNOSIS — Z0000 Encounter for general adult medical examination without abnormal findings: Secondary | ICD-10-CM

## 2020-09-01 DIAGNOSIS — G709 Myoneural disorder, unspecified: Secondary | ICD-10-CM | POA: Diagnosis present

## 2020-09-01 LAB — GLUCOSE, CAPILLARY
Glucose-Capillary: 145 mg/dL — ABNORMAL HIGH (ref 70–99)
Glucose-Capillary: 146 mg/dL — ABNORMAL HIGH (ref 70–99)

## 2020-09-01 LAB — ABO/RH: ABO/RH(D): O POS

## 2020-09-01 SURGERY — POSTERIOR LUMBAR FUSION 2 LEVEL
Anesthesia: General | Site: Spine Lumbar

## 2020-09-01 MED ORDER — SODIUM CHLORIDE 0.9 % IV SOLN: 250.0000 mL | INTRAVENOUS | Status: DC

## 2020-09-01 MED ORDER — FENTANYL CITRATE (PF) 100 MCG/2ML IJ SOLN
INTRAMUSCULAR | Status: AC
  Administered 2020-09-01: 50 ug via INTRAVENOUS
  Filled 2020-09-01: qty 2

## 2020-09-01 MED ORDER — FLEET ENEMA 7-19 GM/118ML RE ENEM: 1.0000 | ENEMA | Freq: Once | RECTAL | Status: DC | PRN

## 2020-09-01 MED ORDER — GELATIN ABSORBABLE 100 EX MISC
CUTANEOUS | Status: DC | PRN
  Administered 2020-09-01: 1 via TOPICAL

## 2020-09-01 MED ORDER — HYDROCODONE-ACETAMINOPHEN 7.5-325 MG PO TABS: 2.0000 | ORAL_TABLET | ORAL | Status: DC | PRN

## 2020-09-01 MED ORDER — FENTANYL CITRATE (PF) 250 MCG/5ML IJ SOLN
INTRAMUSCULAR | Status: AC
  Filled 2020-09-01: qty 5

## 2020-09-01 MED ORDER — PROPOFOL 10 MG/ML IV BOLUS
INTRAVENOUS | Status: DC | PRN
  Administered 2020-09-01: 150 mg via INTRAVENOUS

## 2020-09-01 MED ORDER — BUPIVACAINE LIPOSOME 1.3 % IJ SUSP
10.0000 mL | Freq: Once | INTRAMUSCULAR | Status: DC
  Filled 2020-09-01: qty 10

## 2020-09-01 MED ORDER — BUPIVACAINE LIPOSOME 1.3 % IJ SUSP
INTRAMUSCULAR | Status: AC
  Filled 2020-09-01: qty 20

## 2020-09-01 MED ORDER — VANCOMYCIN HCL 1000 MG IV SOLR
INTRAVENOUS | Status: AC
  Filled 2020-09-01: qty 1000

## 2020-09-01 MED ORDER — THROMBIN 20000 UNITS EX SOLR
CUTANEOUS | Status: DC | PRN
  Administered 2020-09-01: 20 mL via TOPICAL

## 2020-09-01 MED ORDER — LACTATED RINGERS IV SOLN: INTRAVENOUS | Status: DC

## 2020-09-01 MED ORDER — OXYCODONE HCL 5 MG/5ML PO SOLN: 5.0000 mg | Freq: Once | ORAL | Status: DC | PRN

## 2020-09-01 MED ORDER — ACETAMINOPHEN 500 MG PO TABS
1000.0000 mg | ORAL_TABLET | Freq: Four times a day (QID) | ORAL | Status: AC
  Administered 2020-09-01 – 2020-09-02 (×4): 1000 mg via ORAL
  Filled 2020-09-01 (×4): qty 2

## 2020-09-01 MED ORDER — BUPIVACAINE HCL (PF) 0.5 % IJ SOLN
INTRAMUSCULAR | Status: AC
  Filled 2020-09-01: qty 30

## 2020-09-01 MED ORDER — ONDANSETRON HCL 4 MG/2ML IJ SOLN: 4.0000 mg | Freq: Once | INTRAMUSCULAR | Status: DC | PRN

## 2020-09-01 MED ORDER — PHENOL 1.4 % MT LIQD
1.0000 | OROMUCOSAL | Status: DC | PRN
  Filled 2020-09-01: qty 177

## 2020-09-01 MED ORDER — PHENYLEPHRINE 40 MCG/ML (10ML) SYRINGE FOR IV PUSH (FOR BLOOD PRESSURE SUPPORT)
PREFILLED_SYRINGE | INTRAVENOUS | Status: DC | PRN
  Administered 2020-09-01: 200 ug via INTRAVENOUS

## 2020-09-01 MED ORDER — MIDAZOLAM HCL 5 MG/5ML IJ SOLN
INTRAMUSCULAR | Status: DC | PRN
  Administered 2020-09-01: 2 mg via INTRAVENOUS

## 2020-09-01 MED ORDER — ACETAMINOPHEN 650 MG RE SUPP: 650.0000 mg | RECTAL | Status: DC | PRN

## 2020-09-01 MED ORDER — CEFAZOLIN SODIUM-DEXTROSE 2-4 GM/100ML-% IV SOLN
2.0000 g | Freq: Three times a day (TID) | INTRAVENOUS | Status: AC
  Administered 2020-09-01 – 2020-09-02 (×2): 2 g via INTRAVENOUS
  Filled 2020-09-01 (×2): qty 100

## 2020-09-01 MED ORDER — BUPIVACAINE HCL 0.5 % IJ SOLN
INTRAMUSCULAR | Status: DC | PRN
  Administered 2020-09-01: 5 mL
  Administered 2020-09-01: 10 mL

## 2020-09-01 MED ORDER — THROMBIN 20000 UNITS EX SOLR
CUTANEOUS | Status: AC
  Filled 2020-09-01: qty 20000

## 2020-09-01 MED ORDER — TRANEXAMIC ACID-NACL 1000-0.7 MG/100ML-% IV SOLN
INTRAVENOUS | Status: DC | PRN
  Administered 2020-09-01: 1000 mg via INTRAVENOUS

## 2020-09-01 MED ORDER — LACTATED RINGERS IV SOLN: INTRAVENOUS | Status: DC | PRN

## 2020-09-01 MED ORDER — PHENYLEPHRINE HCL-NACL 10-0.9 MG/250ML-% IV SOLN
INTRAVENOUS | Status: DC | PRN
  Administered 2020-09-01: 65 ug/min via INTRAVENOUS
  Administered 2020-09-01: 30 ug/min via INTRAVENOUS

## 2020-09-01 MED ORDER — MIDAZOLAM HCL 2 MG/2ML IJ SOLN
INTRAMUSCULAR | Status: AC
  Filled 2020-09-01: qty 2

## 2020-09-01 MED ORDER — PHENYLEPHRINE HCL (PRESSORS) 10 MG/ML IV SOLN
INTRAVENOUS | Status: AC
  Filled 2020-09-01: qty 1

## 2020-09-01 MED ORDER — HYDROCODONE-ACETAMINOPHEN 7.5-325 MG PO TABS
1.0000 | ORAL_TABLET | ORAL | Status: DC | PRN
Start: 2020-09-01 — End: 2020-09-05
  Administered 2020-09-04: 1 via ORAL
  Filled 2020-09-01: qty 1

## 2020-09-01 MED ORDER — SODIUM CHLORIDE 0.9 % IV SOLN: INTRAVENOUS | Status: DC

## 2020-09-01 MED ORDER — LISINOPRIL 20 MG PO TABS
20.0000 mg | ORAL_TABLET | Freq: Every day | ORAL | Status: DC
  Administered 2020-09-01: 20 mg via ORAL
  Filled 2020-09-01 (×2): qty 1

## 2020-09-01 MED ORDER — MENTHOL 3 MG MT LOZG: 1.0000 | LOZENGE | OROMUCOSAL | Status: DC | PRN

## 2020-09-01 MED ORDER — PANTOPRAZOLE SODIUM 40 MG PO TBEC: 40.0000 mg | DELAYED_RELEASE_TABLET | Freq: Every day | ORAL | Status: DC

## 2020-09-01 MED ORDER — TRANEXAMIC ACID-NACL 1000-0.7 MG/100ML-% IV SOLN
INTRAVENOUS | Status: AC
  Filled 2020-09-01: qty 100

## 2020-09-01 MED ORDER — DEXAMETHASONE SODIUM PHOSPHATE 10 MG/ML IJ SOLN
INTRAMUSCULAR | Status: DC | PRN
  Administered 2020-09-01: 10 mg via INTRAVENOUS

## 2020-09-01 MED ORDER — SODIUM CHLORIDE 0.9% FLUSH
3.0000 mL | Freq: Two times a day (BID) | INTRAVENOUS | Status: DC
  Administered 2020-09-02 – 2020-09-04 (×4): 3 mL via INTRAVENOUS

## 2020-09-01 MED ORDER — ATORVASTATIN CALCIUM 40 MG PO TABS
40.0000 mg | ORAL_TABLET | Freq: Every day | ORAL | Status: DC
  Administered 2020-09-01 – 2020-09-04 (×4): 40 mg via ORAL
  Filled 2020-09-01 (×4): qty 1

## 2020-09-01 MED ORDER — BUPIVACAINE LIPOSOME 1.3 % IJ SUSP
INTRAMUSCULAR | Status: DC | PRN
  Administered 2020-09-01: 10 mL
  Administered 2020-09-01: 5 mL

## 2020-09-01 MED ORDER — CEFAZOLIN SODIUM-DEXTROSE 2-4 GM/100ML-% IV SOLN
2.0000 g | INTRAVENOUS | Status: AC
  Administered 2020-09-01 (×2): 2 g via INTRAVENOUS

## 2020-09-01 MED ORDER — ROCURONIUM BROMIDE 10 MG/ML (PF) SYRINGE
PREFILLED_SYRINGE | INTRAVENOUS | Status: DC | PRN
  Administered 2020-09-01: 30 mg via INTRAVENOUS
  Administered 2020-09-01: 40 mg via INTRAVENOUS
  Administered 2020-09-01: 100 mg via INTRAVENOUS
  Administered 2020-09-01 (×2): 30 mg via INTRAVENOUS

## 2020-09-01 MED ORDER — CHLORHEXIDINE GLUCONATE 0.12 % MT SOLN
OROMUCOSAL | Status: AC
  Administered 2020-09-01: 15 mL via OROMUCOSAL
  Filled 2020-09-01: qty 15

## 2020-09-01 MED ORDER — GABAPENTIN 300 MG PO CAPS
300.0000 mg | ORAL_CAPSULE | Freq: Three times a day (TID) | ORAL | Status: DC
  Administered 2020-09-01 – 2020-09-04 (×9): 300 mg via ORAL
  Filled 2020-09-01 (×9): qty 1

## 2020-09-01 MED ORDER — VANCOMYCIN HCL 1000 MG IV SOLR
INTRAVENOUS | Status: DC | PRN
  Administered 2020-09-01: 1000 mg via TOPICAL

## 2020-09-01 MED ORDER — SUGAMMADEX SODIUM 200 MG/2ML IV SOLN
INTRAVENOUS | Status: DC | PRN
  Administered 2020-09-01: 200 mg via INTRAVENOUS

## 2020-09-01 MED ORDER — LIDOCAINE 2% (20 MG/ML) 5 ML SYRINGE
INTRAMUSCULAR | Status: DC | PRN
  Administered 2020-09-01: 100 mg via INTRAVENOUS

## 2020-09-01 MED ORDER — ORAL CARE MOUTH RINSE: 15.0000 mL | Freq: Once | OROMUCOSAL | Status: AC

## 2020-09-01 MED ORDER — ACETAMINOPHEN 325 MG PO TABS
650.0000 mg | ORAL_TABLET | ORAL | Status: DC | PRN
  Administered 2020-09-03 (×2): 650 mg via ORAL
  Filled 2020-09-01 (×2): qty 2

## 2020-09-01 MED ORDER — METHOCARBAMOL 500 MG PO TABS
500.0000 mg | ORAL_TABLET | Freq: Four times a day (QID) | ORAL | Status: DC | PRN
  Administered 2020-09-02 – 2020-09-04 (×6): 500 mg via ORAL
  Filled 2020-09-01 (×7): qty 1

## 2020-09-01 MED ORDER — DOCUSATE SODIUM 100 MG PO CAPS
100.0000 mg | ORAL_CAPSULE | Freq: Two times a day (BID) | ORAL | Status: DC
  Administered 2020-09-01 – 2020-09-05 (×8): 100 mg via ORAL
  Filled 2020-09-01 (×8): qty 1

## 2020-09-01 MED ORDER — ALUM & MAG HYDROXIDE-SIMETH 200-200-20 MG/5ML PO SUSP: 30.0000 mL | Freq: Four times a day (QID) | ORAL | Status: DC | PRN

## 2020-09-01 MED ORDER — PANTOPRAZOLE SODIUM 40 MG IV SOLR
40.0000 mg | Freq: Every day | INTRAVENOUS | Status: DC
  Administered 2020-09-01: 40 mg via INTRAVENOUS
  Filled 2020-09-01: qty 40

## 2020-09-01 MED ORDER — HEMOSTATIC AGENTS (NO CHARGE) OPTIME
TOPICAL | Status: DC | PRN
  Administered 2020-09-01: 1 via TOPICAL

## 2020-09-01 MED ORDER — ADULT MULTIVITAMIN W/MINERALS CH
1.0000 | ORAL_TABLET | Freq: Every day | ORAL | Status: DC
  Administered 2020-09-01 – 2020-09-05 (×5): 1 via ORAL
  Filled 2020-09-01 (×5): qty 1

## 2020-09-01 MED ORDER — ONDANSETRON HCL 4 MG PO TABS: 4.0000 mg | ORAL_TABLET | Freq: Four times a day (QID) | ORAL | Status: DC | PRN

## 2020-09-01 MED ORDER — AMISULPRIDE (ANTIEMETIC) 5 MG/2ML IV SOLN: 10.0000 mg | Freq: Once | INTRAVENOUS | Status: DC | PRN

## 2020-09-01 MED ORDER — ONDANSETRON HCL 4 MG/2ML IJ SOLN: 4.0000 mg | Freq: Four times a day (QID) | INTRAMUSCULAR | Status: DC | PRN

## 2020-09-01 MED ORDER — 0.9 % SODIUM CHLORIDE (POUR BTL) OPTIME
TOPICAL | Status: DC | PRN
  Administered 2020-09-01: 1000 mL

## 2020-09-01 MED ORDER — BISACODYL 5 MG PO TBEC: 5.0000 mg | DELAYED_RELEASE_TABLET | Freq: Every day | ORAL | Status: DC | PRN

## 2020-09-01 MED ORDER — SODIUM CHLORIDE 0.9 % IV SOLN: INTRAVENOUS | Status: DC | PRN

## 2020-09-01 MED ORDER — FENTANYL CITRATE (PF) 100 MCG/2ML IJ SOLN
25.0000 ug | INTRAMUSCULAR | Status: DC | PRN
  Administered 2020-09-01: 50 ug via INTRAVENOUS

## 2020-09-01 MED ORDER — OMEGA-3-ACID ETHYL ESTERS 1 G PO CAPS
1.0000 g | ORAL_CAPSULE | Freq: Every day | ORAL | Status: DC
  Administered 2020-09-01 – 2020-09-05 (×5): 1 g via ORAL
  Filled 2020-09-01 (×5): qty 1

## 2020-09-01 MED ORDER — CEFAZOLIN SODIUM-DEXTROSE 2-4 GM/100ML-% IV SOLN
INTRAVENOUS | Status: AC
  Filled 2020-09-01: qty 100

## 2020-09-01 MED ORDER — OXYCODONE HCL 5 MG PO TABS: 5.0000 mg | ORAL_TABLET | Freq: Once | ORAL | Status: DC | PRN

## 2020-09-01 MED ORDER — PROPOFOL 10 MG/ML IV BOLUS
INTRAVENOUS | Status: AC
  Filled 2020-09-01: qty 40

## 2020-09-01 MED ORDER — MORPHINE SULFATE (PF) 2 MG/ML IV SOLN
1.0000 mg | INTRAVENOUS | Status: DC | PRN
Start: 2020-09-01 — End: 2020-09-05
  Administered 2020-09-02 – 2020-09-05 (×10): 1 mg via INTRAVENOUS
  Filled 2020-09-01 (×10): qty 1

## 2020-09-01 MED ORDER — ALBUMIN HUMAN 5 % IV SOLN: INTRAVENOUS | Status: DC | PRN

## 2020-09-01 MED ORDER — POLYETHYLENE GLYCOL 3350 17 G PO PACK: 17.0000 g | PACK | Freq: Every day | ORAL | Status: DC | PRN

## 2020-09-01 MED ORDER — CHLORHEXIDINE GLUCONATE 0.12 % MT SOLN: 15.0000 mL | Freq: Once | OROMUCOSAL | Status: AC

## 2020-09-01 MED ORDER — FENTANYL CITRATE (PF) 250 MCG/5ML IJ SOLN
INTRAMUSCULAR | Status: DC | PRN
  Administered 2020-09-01 (×5): 50 ug via INTRAVENOUS
  Administered 2020-09-01: 100 ug via INTRAVENOUS

## 2020-09-01 MED ORDER — METHOCARBAMOL 1000 MG/10ML IJ SOLN
500.0000 mg | Freq: Four times a day (QID) | INTRAVENOUS | Status: DC | PRN
  Filled 2020-09-01: qty 5

## 2020-09-01 MED ORDER — SODIUM CHLORIDE 0.9% FLUSH: 3.0000 mL | INTRAVENOUS | Status: DC | PRN

## 2020-09-01 MED ORDER — ONDANSETRON HCL 4 MG/2ML IJ SOLN
INTRAMUSCULAR | Status: DC | PRN
  Administered 2020-09-01: 4 mg via INTRAVENOUS

## 2020-09-01 MED ORDER — HYDROCODONE-ACETAMINOPHEN 7.5-325 MG PO TABS
1.0000 | ORAL_TABLET | Freq: Four times a day (QID) | ORAL | Status: DC
  Administered 2020-09-01 – 2020-09-05 (×15): 1 via ORAL
  Filled 2020-09-01 (×16): qty 1

## 2020-09-01 SURGICAL SUPPLY — 76 items
BLADE CLIPPER SURG (BLADE) IMPLANT
BONE VIVIGEN FORMABLE 10CC (Bone Implant) ×2 IMPLANT
BUR MATCHSTICK NEURO 3.0 LAGG (BURR) ×2 IMPLANT
BUR RND FLUTED 2.5 (BURR) IMPLANT
BUR SABER RD CUTTING 3.0 (BURR) ×2 IMPLANT
CAGE LORD X-PAC LT 12 X 28 (Cage) ×4 IMPLANT
CAGE LORD XPAC 9X25X10 (Cage) ×2 IMPLANT
CNTNR URN SCR LID CUP LEK RST (MISCELLANEOUS) ×1 IMPLANT
CONT SPEC 4OZ STRL OR WHT (MISCELLANEOUS) ×1
COVER BACK TABLE 80X110 HD (DRAPES) ×2 IMPLANT
COVER MAYO STAND STRL (DRAPES) ×2 IMPLANT
COVER SURGICAL LIGHT HANDLE (MISCELLANEOUS) ×2 IMPLANT
COVER WAND RF STERILE (DRAPES) IMPLANT
DERMABOND ADVANCED (GAUZE/BANDAGES/DRESSINGS)
DERMABOND ADVANCED .7 DNX12 (GAUZE/BANDAGES/DRESSINGS) IMPLANT
DRAPE C-ARM 42X72 X-RAY (DRAPES) ×2 IMPLANT
DRAPE C-ARMOR (DRAPES) ×2 IMPLANT
DRAPE MICROSCOPE LEICA (MISCELLANEOUS) ×2 IMPLANT
DRAPE SURG 17X23 STRL (DRAPES) ×8 IMPLANT
DRSG MEPILEX BORDER 4X4 (GAUZE/BANDAGES/DRESSINGS) IMPLANT
DRSG MEPILEX BORDER 4X8 (GAUZE/BANDAGES/DRESSINGS) ×2 IMPLANT
DURAPREP 26ML APPLICATOR (WOUND CARE) ×2 IMPLANT
ELECT BLADE 6.5 EXT (BLADE) IMPLANT
ELECT CAUTERY BLADE 6.4 (BLADE) ×2 IMPLANT
ELECT REM PT RETURN 9FT ADLT (ELECTROSURGICAL) ×2
ELECTRODE REM PT RTRN 9FT ADLT (ELECTROSURGICAL) ×1 IMPLANT
EVACUATOR 1/8 PVC DRAIN (DRAIN) ×2 IMPLANT
GAUZE SPONGE 4X4 12PLY STRL (GAUZE/BANDAGES/DRESSINGS) ×2 IMPLANT
GLOVE ECLIPSE 9.0 STRL (GLOVE) ×2 IMPLANT
GLOVE ORTHO TXT STRL SZ7.5 (GLOVE) ×2 IMPLANT
GLOVE SRG 8 PF TXTR STRL LF DI (GLOVE) ×1 IMPLANT
GLOVE SURG 8.5 LATEX PF (GLOVE) ×2 IMPLANT
GLOVE SURG UNDER POLY LF SZ8 (GLOVE) ×1
GOWN STRL REUS W/ TWL LRG LVL3 (GOWN DISPOSABLE) ×1 IMPLANT
GOWN STRL REUS W/TWL 2XL LVL3 (GOWN DISPOSABLE) ×4 IMPLANT
GOWN STRL REUS W/TWL LRG LVL3 (GOWN DISPOSABLE) ×1
KIT BASIN OR (CUSTOM PROCEDURE TRAY) ×2 IMPLANT
KIT POSITION SURG JACKSON T1 (MISCELLANEOUS) ×2 IMPLANT
KIT TURNOVER KIT B (KITS) ×2 IMPLANT
MANIFOLD NEPTUNE II (INSTRUMENTS) ×2 IMPLANT
MILL MEDIUM DISP (BLADE) ×2 IMPLANT
NEEDLE 22X1 1/2 (OR ONLY) (NEEDLE) ×2 IMPLANT
NEEDLE SPNL 18GX3.5 QUINCKE PK (NEEDLE) ×2 IMPLANT
NS IRRIG 1000ML POUR BTL (IV SOLUTION) ×2 IMPLANT
PACK LAMINECTOMY ORTHO (CUSTOM PROCEDURE TRAY) ×2 IMPLANT
PAD ARMBOARD 7.5X6 YLW CONV (MISCELLANEOUS) ×4 IMPLANT
PATTIES SURGICAL .75X.75 (GAUZE/BANDAGES/DRESSINGS) IMPLANT
PATTIES SURGICAL 1X1 (DISPOSABLE) ×2 IMPLANT
ROD EXPEDIUM PER BENT 65MM (Rod) ×4 IMPLANT
SCREW CORT FIX FEN 5.5X7X55MM (Screw) ×4 IMPLANT
SCREW SET SINGLE INNER (Screw) ×12 IMPLANT
SCREW VIPER 7X45MM (Screw) ×4 IMPLANT
SCREW VIPER 7X50MM (Screw) ×4 IMPLANT
SPONGE LAP 18X18 RF (DISPOSABLE) ×2 IMPLANT
SPONGE LAP 4X18 RFD (DISPOSABLE) ×4 IMPLANT
SPONGE SURGIFOAM ABS GEL 100 (HEMOSTASIS) ×2 IMPLANT
STAPLER VISISTAT 35W (STAPLE) ×2 IMPLANT
SURGIFLO W/THROMBIN 8M KIT (HEMOSTASIS) ×2 IMPLANT
SUT VIC AB 0 CT1 27 (SUTURE) ×1
SUT VIC AB 0 CT1 27XBRD ANBCTR (SUTURE) ×1 IMPLANT
SUT VIC AB 1 CTX 36 (SUTURE) ×1
SUT VIC AB 1 CTX36XBRD ANBCTR (SUTURE) ×1 IMPLANT
SUT VIC AB 2-0 CT1 27 (SUTURE) ×1
SUT VIC AB 2-0 CT1 TAPERPNT 27 (SUTURE) ×1 IMPLANT
SUT VIC AB 3-0 X1 27 (SUTURE) ×2 IMPLANT
SYR 20ML LL LF (SYRINGE) ×2 IMPLANT
SYR CONTROL 10ML LL (SYRINGE) ×4 IMPLANT
TAP CANN VIPER2 DL 5.0 (TAP) ×2 IMPLANT
TAP CANN VIPER2 DL 6.0 (TAP) ×2 IMPLANT
TAP CANN VIPER2 DL 7.0 (TAP) ×2 IMPLANT
TAP VIPER MIS 4.35MM (TAP) ×2 IMPLANT
TOWEL GREEN STERILE (TOWEL DISPOSABLE) ×2 IMPLANT
TOWEL GREEN STERILE FF (TOWEL DISPOSABLE) ×2 IMPLANT
TRAY FOLEY MTR SLVR 16FR STAT (SET/KITS/TRAYS/PACK) ×2 IMPLANT
WATER STERILE IRR 1000ML POUR (IV SOLUTION) IMPLANT
YANKAUER SUCT BULB TIP NO VENT (SUCTIONS) ×2 IMPLANT

## 2020-09-01 NOTE — Anesthesia Postprocedure Evaluation (Signed)
Anesthesia Post Note  Patient: Julia Knight  Procedure(s) Performed: CENTRAL LAMINECTOMY LUMBAR ONE-TWO, LUMBAR TWO-THREE, LUMBAR THREE-FOUR AND LUMBAR FOUR-FIVE, TRANSFORAMINAL LUMBAR INTERBODY FUSION LEFT LUMBAR THREE-FOUR AND LUMBAR FOUR-FIVE WITH PEDICLE SCREWS, RODS, CAGES, LOCAL AND ALLOGRAFT BONE GRAFT, VIVIGEN (N/A Spine Lumbar)     Patient location during evaluation: PACU Anesthesia Type: General Level of consciousness: awake and alert Pain management: pain level controlled Vital Signs Assessment: post-procedure vital signs reviewed and stable Respiratory status: spontaneous breathing, nonlabored ventilation and respiratory function stable Cardiovascular status: blood pressure returned to baseline and stable Postop Assessment: no apparent nausea or vomiting Anesthetic complications: no   No complications documented.  Last Vitals:  Vitals:   09/01/20 1558 09/01/20 1611  BP:  (!) 122/59  Pulse: 76 73  Resp: 11 15  Temp:    SpO2: 100% 100%    Last Pain:  Vitals:   09/01/20 1613  TempSrc:   PainSc: 3                  Lucretia Kern

## 2020-09-01 NOTE — Interval H&P Note (Signed)
History and Physical Interval Note:  09/01/2020 7:37 AM  Julia Knight  has presented today for surgery, with the diagnosis of lumbar spinal stenosis L1-2, L2-3, L3-4 and L4-5, lumbar spondylolisthesis L3-4 and L4-5.  The various methods of treatment have been discussed with the patient and family. After consideration of risks, benefits and other options for treatment, the patient has consented to  Procedure(s): CENTRAL LAMINECTOMY L1-2, L2-3, L3-4 AND L4-5, TRANSFORAMINAL LUMBAR INTERBODY FUSION LEFT L3-4 AND L4-5 WITH PEDICLE SCREWS, RODS, CAGES, LOCAL AND ALLOGRAFT BONE GRAFT, VIVIGEN (N/A) as a surgical intervention.  The patient's history has been reviewed, patient examined, no change in status, stable for surgery.  I have reviewed the patient's chart and labs.  Questions were answered to the patient's satisfaction.     [name]

## 2020-09-01 NOTE — Transfer of Care (Signed)
Immediate Anesthesia Transfer of Care Note  Patient: Julia Knight  Procedure(s) Performed: CENTRAL LAMINECTOMY LUMBAR ONE-TWO, LUMBAR TWO-THREE, LUMBAR THREE-FOUR AND LUMBAR FOUR-FIVE, TRANSFORAMINAL LUMBAR INTERBODY FUSION LEFT LUMBAR THREE-FOUR AND LUMBAR FOUR-FIVE WITH PEDICLE SCREWS, RODS, CAGES, LOCAL AND ALLOGRAFT BONE GRAFT, VIVIGEN (N/A Spine Lumbar)  Patient Location: PACU  Anesthesia Type:General  Level of Consciousness: drowsy  Airway & Oxygen Therapy: Patient Spontanous Breathing and Patient connected to face mask oxygen  Post-op Assessment: Report given to RN and Post -op Vital signs reviewed and stable  Post vital signs: Reviewed and stable  Last Vitals:  Vitals Value Taken Time  BP 104/50 09/01/20 1526  Temp 36.3 C 09/01/20 1526  Pulse 74 09/01/20 1530  Resp 18 09/01/20 1530  SpO2 100 % 09/01/20 1530  Vitals shown include unvalidated device data.  Last Pain:  Vitals:   09/01/20 9597  TempSrc: Oral         Complications: No complications documented.

## 2020-09-01 NOTE — Brief Op Note (Signed)
09/01/2020  3:12 PM  PATIENT:  Julia Knight  70 y.o. female  PRE-OPERATIVE DIAGNOSIS:  lumbar spinal stenosis L1-2, L2-3, L3-4 and L4-5, lumbar spondylolisthesis L3-4 and L4-5  POST-OPERATIVE DIAGNOSIS:  lumbar spinal stenosis L1-2, L2-3, L3-4 and L4-5, lumbar spondylolisthesis L3-4  PROCEDURE:  Procedure(s): CENTRAL LAMINECTOMY LUMBAR ONE-TWO, LUMBAR TWO-THREE, LUMBAR THREE-FOUR AND LUMBAR FOUR-FIVE, TRANSFORAMINAL LUMBAR INTERBODY FUSION LEFT LUMBAR THREE-FOUR AND LUMBAR FOUR-FIVE WITH PEDICLE SCREWS, RODS, CAGES, LOCAL AND ALLOGRAFT BONE GRAFT, VIVIGEN (N/A)  SURGEON:  Surgeon(s) and Role:    * Kerrin Champagne, MD - Primary  PHYSICIAN ASSISTANT: Zonia Kief, PA-C  ANESTHESIA:   local and general, Dr. Clemens Catholic MD  EBL:  650 mL   BLOOD ADMINISTERED:300 CC CELLSAVER  DRAINS: (10 Fr) Hemovact drain(s) in the Left lumbar with  Suction Open   LOCAL MEDICATIONS USED:  MARCAINE 0.5% 1:1 EXPAREL 1.3%  Amount: 30 ml  SPECIMEN:  No Specimen  DISPOSITION OF SPECIMEN:  N/A  COUNTS:  YES  TOURNIQUET:  * No tourniquets in log *  DICTATION: .Dragon Dictation  PLAN OF CARE: Admit to inpatient   PATIENT DISPOSITION:  PACU - hemodynamically stable.   Delay start of Pharmacological VTE agent (>24hrs) due to surgical blood loss or risk of bleeding: yes

## 2020-09-01 NOTE — Plan of Care (Signed)
Pt's daughter and interpreter at bedside.  Problem: Health Behavior/Discharge Planning: Goal: Ability to manage health-related needs will improve Outcome: Progressing   Problem: Clinical Measurements: Goal: Ability to maintain clinical measurements within normal limits will improve Outcome: Progressing   Problem: Activity: Goal: Risk for activity intolerance will decrease Outcome: Progressing   Problem: Nutrition: Goal: Adequate nutrition will be maintained Outcome: Progressing   Problem: Coping: Goal: Level of anxiety will decrease Outcome: Progressing   Problem: Elimination: Goal: Will not experience complications related to bowel motility Outcome: Progressing   Problem: Pain Managment: Goal: General experience of comfort will improve Outcome: Progressing   Problem: Safety: Goal: Ability to remain free from injury will improve Outcome: Progressing   Problem: Skin Integrity: Goal: Risk for impaired skin integrity will decrease Outcome: Progressing

## 2020-09-01 NOTE — Discharge Instructions (Addendum)

## 2020-09-01 NOTE — Progress Notes (Signed)
Orthopedic Tech Progress Note Patient Details:  Julia Knight 07-May-1951 [id number] Dropped off not applied Ortho Devices Type of Ortho Device: Lumbar corsett Ortho Device/Splint Location: BACK Ortho Device/Splint Interventions: Ordered   Post Interventions Patient Tolerated: Well Instructions Provided: Care of device   Donald Pore 09/01/2020, 5:20 PM

## 2020-09-01 NOTE — Anesthesia Procedure Notes (Signed)
Procedure Name: Intubation Date/Time: 09/01/2020 7:56 AM Performed by: Griffin Dakin, CRNA Pre-anesthesia Checklist: Patient identified, Emergency Drugs available, Suction available and Patient being monitored Patient Re-evaluated:Patient Re-evaluated prior to induction Oxygen Delivery Method: Circle system utilized Preoxygenation: Pre-oxygenation with 100% oxygen Induction Type: IV induction Ventilation: Mask ventilation without difficulty and Oral airway inserted - appropriate to patient size Laryngoscope Size: Mac and 4 Grade View: Grade I Tube type: Oral Tube size: 7.0 mm Number of attempts: 1 Airway Equipment and Method: Stylet and Oral airway Placement Confirmation: ETT inserted through vocal cords under direct vision,  positive ETCO2 and breath sounds checked- equal and bilateral Secured at: 22 cm Tube secured with: Tape Dental Injury: Teeth and Oropharynx as per pre-operative assessment

## 2020-09-01 NOTE — Plan of Care (Signed)
  Problem: Education: Goal: Knowledge of General Education information will improve Description: Including pain rating scale, medication(s)/side effects and non-pharmacologic comfort measures Outcome: Progressing   Problem: Nutrition: Goal: Adequate nutrition will be maintained Outcome: Progressing   

## 2020-09-01 NOTE — Op Note (Addendum)
09/01/2020  5:46 PM  PATIENT:  Julia Knight  70 y.o. female  MRN: [id number]  OPERATIVE REPORT  PRE-OPERATIVE DIAGNOSIS:  lumbar spinal stenosis L1-2, L2-3, L3-4 and L4-5, lumbar spondylolisthesis L3-4 and L4-5  POST-OPERATIVE DIAGNOSIS:  lumbar spinal stenosis L1-2, L2-3, L3-4 and L4-5, lumbar spondylolisthesis L3-4  PROCEDURE:  Procedure(s): CENTRAL LAMINECTOMY LUMBAR ONE-TWO, LUMBAR TWO-THREE, LUMBAR THREE-FOUR AND LUMBAR FOUR-FIVE, TRANSFORAMINAL LUMBAR INTERBODY FUSION LEFT LUMBAR THREE-FOUR AND LUMBAR FOUR-FIVE WITH PEDICLE SCREWS, RODS, CAGES, LOCAL AND ALLOGRAFT BONE GRAFT, VIVIGEN    SURGEON:  Jessy Oto, MD     ASSISTANT:  Benjiman Core, PA-C  (Present throughout the entire procedure and necessary for completion of procedure in a timely manner)     ANESTHESIA:  General, supplemented with local marcaine 0.5% 1:1 exparel 1.3% total 30 cc Dr. Bryson Ha.     COMPLICATIONS:  None.   EBL: 500CC  CELL SAVER BLOOD RETURNED: 130CC  DRAINS: Foley to SD. 10 French Hemovac left lumbar spine.    COMPONENTS:  Implant Name Type Inv. Item Serial No. Manufacturer Lot No. LRB No. Used Action  SCREW CORT FIX FEN 5.5X7X55MM - [id number] Screw SCREW CORT FIX FEN 5.5X7X55MM  [employer]  N/A 2 Implanted  SCREW VIPER 7X45MM - [id number] Screw SCREW VIPER 7X45MM  [employer]  N/A 2 Implanted  SCREW VIPER 7X50MM - [id number] Screw SCREW VIPER 7X50MM  [employer]  N/A 2 Implanted  BONE VIVIGEN FORMABLE 10CC - [id number] Bone Implant BONE VIVIGEN FORMABLE 10CC [id number] [employer]  N/A 1 Implanted  CAGE LORD X-PAC LT 12 X 28 - [id number] Cage CAGE LORD X-PAC LT 12 X 28  [employer]  N/A 2 Implanted  CAGE LORD XPAC [id number] - [id number] Cage CAGE LORD XPAC 9X25X10  [employer]  N/A 1 Implanted  SCREW SET SINGLE INNER - [id number] Screw SCREW SET SINGLE INNER  [employer]  N/A 6 Implanted   ROD EXPEDIUM PER BENT 65MM - [id number] Rod ROD EXPEDIUM PER BENT 65MM  [employer]  N/A 2 Implanted     PROCEDURE:    The patient was met in the holding area, and the appropriate Right L3-4 and L4-5 lumbar levels identified and marked with an "X" and my initials. I had discussion with the patient in the preop holding area regarding consent form. Patient understands the rationale for the fusion site as the L3-4 and L4-5 segment For stenosis and degenerative spondylolisthesis.  The patient was then transported to OR and was placed under general anestheticwithout difficulty. Ancef 2 gm given for prophylaxis. Nursing staff inserted a Foley catheter under sterile conditions. The patient was then turned to a prone position using the [place] spine frame. PAS. all pressure points well padded the arms at the side to 90 90. Standard prep with DuraPrep solution draped in the usual manner from the lower dorsal spine the mid sacral segment. Iodine Vi-Drape was used and the incision was marked. Time-out procedure was called and correct. Skin in the midline between L1 and S1 was then infiltrated with Marcaine half percent 1:1 Exparel 1.3% total of 30 cc used. Incision was then made through the skin and subcutaneous layers down to the patient's lumbodorsal fascia and spinous processes. The incision then carried sharply excising the supraspinous ligament and then continuing the lateral aspect of the spinous processes L1,L2,L3 L4-L5. Cobb elevators used to carefully elevate the paralumbar muscles off of the posterior elements  using electrocautery carefully drilled bleeding and perform dissection of the muscle tissues of the preserving the facet at L2-3. Continuing the exposure out laterally to expose the articular facets of L1-2,L2-3, L3-4 and  L4-5 in the midlinebleeders controlled using electrocautery monopolar electrocautery. Clamps were then placed at the L3-4 level and observed on C-arm fluoroscopy the  upper clamp was beneath the L3 spinous process. The level marked with the OR marking pen sterilely.  Cerebellar retractor was used for the upper part of the incision. C-arm fluoroscopy was then brought into the field and using C-arm fluoroscopy then a hole made into the lateral aspect of the pedicle of L3 observed in the pedicle using ball-tipped nerve hook and hockey stick nerve probe initial entry was determined on fluoroscopy to be good position alignment so that a ball handled probe was then used to probe the left L3 pedicle to a depth of nearly 55 mm observed on C-arm fluoroscopy to be beyond the midpoint of the lumbar vertebra and then position alignment within the left L3 pedicle this was then removed and the pedicle channel probed demonstrating patency no sign of rupture the cortex of the pedicle. Tapping with a 5 mm, 68m and 779mscrew tap then 7.0 mm x 55 mm screw was placed on the left side at the L3 level. C-arm fluoroscopy was used to localize the hole made in the lateral aspect of the pedicle of L3 on the right localizing the pedicle within the spinal canal with nerve hook and hockey-stick nerve probe carefully passed down the center of the L3 pedicle to a depth of nearly 55 mm. Observed on C-arm fluoroscopy to be in good position alignment channel was probed with a ball-tipped probe ensure patency no sign of cortical disruption. Following tapping with a 5 mm, 60m51mnd 7mm28mp,  a 7.0 x 55 mm screw was placed on the table for later placement on the right side pedicle at L3 following TLIF. C-arm fluoroscopy was then brought into the field and using C-arm fluoroscopy then a hole made into the lateral aspect of the pedicle of L4 observed in the pedicle using ball tipped nerve hook and hockey stick nerve probe initial entry was determined on fluoroscopy to be good position alignment so that a ball handled probe was then used to probe the left L4 pedicle to a depth of nearly 50 mm observed on C-arm  fluoroscopy to be beyond the midpoint of the lumbar vertebra and then position alignment within the left L4 pedicle this was then removed and the pedicle channel probed demonstrating patency no sign of rupture the cortex of the pedicle. Tapping with a 5mm,34mmm, 7 mm screw tap then 7.0 mm x 50 mm screw was placed on the left side at the L4 level.C-arm fluoroscopy was used to localize the hole made in the medial aspect of the pedicle of L4 on the right localizing the pedicle within the spinal canal with nerve hook and hockey-stick nerve probe carefully passed down the center of the L4 pedicle to a depth of nearly 50 mm. Observed on C-arm fluoroscopy to be in good position alignment channel was probed with a ball-tipped probe ensure patency no sign of cortical disruption. Following tapping with a 5 mm, 60mm, 860m 7.0mm ta89ma 7.0 x 50 mm screw was placed on table for later placement on the right side pedicle at L4 following TLIF. C-arm fluoroscopy was then brought into the field and using C-arm fluoroscopy then a hole made  into the medial aspect of the pedicle of L5 observed in the pedicle using ball tipped nerve hook and hockey stick nerve probe initial entry was determined on fluoroscopy to be good position alignment so that a ball handled probe was then used to probe the left L5 pedicle to a depth of nearly 45 mm observed on C-arm fluoroscopy to be well aligned within the left L5 pedicle, the pedicle channel probed demonstrating patency no sign of rupture the cortex of the pedicle. Tapping with a 6 mm screw tap then 7.41m tap, then 7.0 mm x 45 mm screw was placed on the left side at the L5 level.C-arm fluoroscopy was used to localize the hole made in the lateral aspect of the pedicle of L5 on the right localizing the pedicle within the spinal canal with nerve hook and hockey-stick nerve probe carefully passed down the center of the L5 pedicle to a depth of nearly 45 mm. Observed on C-arm fluoroscopy to be in good  position alignment channel was probed with a ball-tipped probe ensure patency no sign of cortical disruption. Following tapping with a 6 mm, 753mtap and a 7.0 x 45 mm screw was placed on the right side pedicle at L5.  Spinous processes of L2, L3 and L4 were then resected down to the base the lamina at each segment the lower 50% of the spinous process of L1 was resected and Leksell rongeur used to resect inferior aspect of the lamina bilaterally at the L1 level and bilaterally at L2. The right inferior articular process of L3 and L4 were resected in order to provide for exposure of the right side L3-4 and L4-5 neuroforamen for ease of placement of TLIFs (transforaminal lumbar interbody fusion) essential portions of the lamina were also resected first beginning with the Leksell rongeur and then resecting using 2 and 3 mm Kerrison.Laminectomy was carried out resecting the central portions of the lamina of L2, L3 and L4 performing foraminotomies on the right side at the L5 level. The inferior articular process L3 and L4 were resected on the right side. The L5 nerve root identified bilaterally and the medial aspect of the L5 pedicle. Superior articular process of L5 was then resected from the right side further decompressing the right L4 nerve and providing for exposure of the area just superior to the L5 pedicle for a placement of cage. Returning to the left side decompression was carried out along the right side recut resecting the superior articular process of L3 overlying the L3 nerve root as it exited at the L3-4 level decompressing the lateral recess along the medial aspect of the pedicle of L3 and resecting the superior to the process of L4 and decompressing the right L3 neuroforamen. Loupe magnification and headlight were used during the initial portion procedure. The OR microscope was used to complete the decompression of the right lateral recesses and neuroforamen and then the left side at L1-2, L2-3, L3-4  and L4-5.  At the L4-5 level similarly lateral recess and the neuroforamen were performed decompressing the L4 ad L5 nerve roots and foraminotomies was widely performed over the right L3 nerve and L4 nerve roots. Attention then turned to placement of the transforaminal lumbar interbody fusion cages. Using a Penfield 4 the lateral aspect of the thecal sac and the inferior aspect of the L4 nerve root on the right side at the L4-5 level was carefully drilled. The thecal sac could then easily be retracted in the posterior lateral aspect of the L4-5  disc was exposed 15 blade scalpel used to incise the osteotome used to resect a small portion of bone off the superior aspect of the posterior superior vertebral body of L5 in order to ease the entry into the L4-5 disc space. A pituitary rongeur was then able to be introduced in the disc space debrided it of degenerative disc material. 7 mm dilator was used to dialate the L4-5 disc space on the right side attempts were made to dilate further in intrements to 93m successfully and using small curettes and the disc space was debrided a minimal degenerative disc present in the endplates debrided to bleeding endplate bone. A 10 mm 9-158mx 2892mdjustable cage and careful packed with morcellized bone graft and the been harvested from previous laminotomies additional bone graft was then packed into the intervertebral disc space using the 8 mm trial impacted the graft multiple times. With this then a 9-14 mm x28m28mrditic cage was introduced into the disc space on the right side in the correct degree convergence and then impacted then subset beneath the posterior aspect of the disc space by about 3 or 4 mm. Note that an initial 30mm68mtprint cage was placed but could not be countersunk. So this was removed and the 28mm 21m placed.  Bleeding controlled using bipolar electrocautery thrombin soaked gel cottonoids. Then turned to the right L3-4 level similarly the exposure the  posterior lateral aspect this was carried out using a Penfield 4 bipolar electrocautery to control small bleeders present. Derricho retractor used to retract the thecal sac and nerve root, a 15 blade scalpel was used to incise posterior lateral aspect of the disc at the L3-4 disc space.The space was debrided of degenerative disc material using pituitary along root the entire disc space was then debrided of degenerative disc material using pituitary rongeurs curettage down to bleeding bone endplates. Residual disc was resected using pituitary. This space was then carefully sounded to a 10 mm cage trial provided the best fit the lordotic cage was chosen the 9-15 mm x 25 mm Depuy adjustable cage. The intervertebral disc space was then packed with autogenous local bone graft that been harvested from the central laminectomy and the trial was used to pack the graft using an 8 mm trial spacer and impacting the graft within the disc space. This provided excellent bone graft within the intervertebral disc space at L3-4 so that the permanent 9-15 mm cage by 25 mm was then packed with local bone graft placed into the intervertebral disc space and impacted into place in the correct degree of convergence. Bleeding controlled using bipolar electrocautery. The cage was subset beneath the posterior aspect of the about 3-4 mm. Bleeding was hemostasis attention was turned to the left side where on the left side portion of the central laminectomy at theL1-2, L2-3 and L3-4 levels carefully decompressing thecal sac and the bilateral nerve roots L1, L2, L3 and L4 at the L3-4 and L4-5. Hardware observed on C-arm fluoroscopy to be in good position alignment. With this then the transforaminal lumbar interbody fusion portion of the case was completed, bleeders were controlled using bipolar electrocautery thrombin-soaked Gelfoam were appropriate. 3 screws on the left were previously placed and the right 3 screws L3,L4 and L5 were placed and  then each carefully aligned and tightened or loose and a slight amount to allow for placement of rods. The rod was then placed into the pedicle screws on the left extending from L3-5 each of the caps carefully placed  loosely tightened. Attention turned to the right side were similarly and then screws were carefully adjusted to allow for a better pattern screws to allow for placement of fixation rod template for the rod was then taken and a precontoured quarter inch titanium rod was placed. The right L3 rod fastener cap was then tightened 85 pounds similarly on  At the right side disc space at L3-4 and L4-5 screw fasteners compression was obtained on the right side between L3 and L4. And at L4 and L5 by compressing between the facets right hand the screw caps tightened to 85 pounds. Similarly this was done on the left side at L3 and L4 screws were slightly compressed and tightened 85 pounds. Returning then to the L4-5 level compression was obtained The screw L5 fasteners caps and the L4 fasteners caps were tightened to 80 foot-pounds tIirrigation was carried out with copious amounts of saline solution this was done throughout the case. Cell Saver was used during the case. A single cross-link for the Depuy system was placed at the L4-5 level measured with the measuring tool and the appropriate A7 cross-link was then carefully applied to the rods and tightened again to 80 foot-pounds bilaterally using the appropriate torque screwdriver. Center screw on the cross-link was then carefully tightened 80 foot-pounds irrigation was carried out observation showed no dural tear demonstrated no leakage present. Permanent C-arm images were obtained in AP and lateral plane. Remaining local bone graft was then applied along the left and right lateral posterior lateral region extending from L3 through L5 facets. Excess Gelfoam was then removed the lumbodorsal musculature carefully exam debrided of any devitalized tissue following  removal of Vicryl retractors were the bleeders were controlled using electrocautery. One gram of Vancomycin powder was sprinkled into the incision and subcutaneous tissues, the areas of dorsal lumbar muscle were then approximated in the midline with interrupted #1 Vicryl sutures,  the dorsal fascia was  attached to the spinous process of L1 to superiorly and L5 inferiorly. A 10 french hemovac drain was placed on the left side down to the central laminectomy then the muscles approximated using interrupted 0 Vicryl sutures and 2-0 Vicryl sutures. Skin was closed with stainless steel staples then MedPlex bandage. All instrument and sponge counts were correct. The patient was then returned to a supine position on her bed reactivated extubated and returned to the recovery room in satisfactory condition.    Benjiman Core, PA-C perform the duties of assistant surgeon during this case. He was present from the beginning of the case to the end of the case assisting in transfer the patient from his stretcher to the OR table and back to the stretcher at the end of the case. Assisted in careful retraction and suction of the laminectomy site delicate neural structures operating under the operating room microscope. He performed closure of the incision from the fascia to the skin applying the dressing.     Basil Dess  09/01/2020, 3:20 PM

## 2020-09-01 NOTE — H&P (Signed)
Julia Knight is an 70 y.o. female.   Chief Complaint: back pain, LE radiculopathy,neurogenic claudication  HPI: 70 year old Hispanic female with known history of L1-L5 stenosis/HNP comes in for preop evaluation.  She continues to have ongoing symptoms that are unchanged from previous visit.  She is wanting to proceed with CENTRAL LAMINECTOMY L1-2, L2-3, L3-4 AND L4-5, TRANSFORAMINAL LUMBAR INTERBODY FUSION LEFT L3-4 AND L4-5 WITH PEDICLE SCREWS, RODS, CAGES, LOCAL AND ALLOGRAFT BONE GRAFT, VIVIGEN.  Today he is doing physical performed.  Review of systems negative.  Patient comes in with daughter and interpreter.   Past Medical History:  Diagnosis Date  . Allergy   . Anxiety   . Arthritis    back and hands  . COVID-19 11/16/2018  . Depression   . Diabetes mellitus without complication (HCC)    type 2  . Dizziness 08/16/2017  . Gastroenteritis, infectious, presumed 08/16/2017  . GERD (gastroesophageal reflux disease)   . Hyperlipemia   . Hypertension   . Neuromuscular disorder [facility])     Past Surgical History:  Procedure Laterality Date  . APPENDECTOMY    . CHOLECYSTECTOMY    . TONSILLECTOMY     removed as an adult    No family history on file. Social History:  reports that she has been smoking cigarettes. She has never used smokeless tobacco. She reports that she does not drink alcohol and does not use drugs.  Allergies: No Known Allergies  Medications Prior to Admission  Medication Sig Dispense Refill  . acetaminophen (TYLENOL) 500 MG tablet Take 1,000 mg by mouth every 6 (six) hours as needed (for pain).    Marland Kitchen atorvastatin (LIPITOR) 40 MG tablet TAKE 1 TABLET (40MG ) BY MOUTH EVERY NIGHT AT BEDTIME. (Patient taking differently: Take 40 mg by mouth at bedtime.) 30 tablet 11  . calcium carbonate (OSCAL) 1500 (600 Ca) MG TABS tablet Take 600 mg of elemental calcium by mouth in the morning.    ibuprofen (ADVIL) 200 MG tablet Take 400 mg by mouth every 8 (eight) hours  as needed.    Marland Kitchen lisinopril (ZESTRIL) 20 MG tablet TAKE 1 TABLET (20MG ) BY MOUTH EVERY MORNING. (Patient taking differently: Take 20 mg by mouth in the morning.) 30 tablet 11  . metFORMIN (GLUCOPHAGE) 1000 MG tablet TAKE 1 TABLET (1000MG ) BY MOUTH EVERY MORNING AND 1 TABLET (1000MG ) BY MOUTH EVERY EVENING WITH A MEAL. 60 tablet 11  . Multiple Vitamin (MULTIVITAMIN WITH MINERALS) TABS tablet Take 1 tablet by mouth in the morning.    . pantoprazole (PROTONIX) 40 MG tablet TAKE 1 TABLET BY MOUTH EVERY MORNING AND 1 TABLET BY MOUTH EVERY EVENING FOR 7 DAYS. THEN TAKE ONLY 1 TABLET BY MOUTH EVERY EVENING (Patient taking differently: Take 40 mg by mouth daily.) 37 tablet 0    No results found for this or any previous visit (from the past 48 hour(s)). No results found.  Review of Systems  Constitutional: Positive for activity change.  HENT: Negative.   Respiratory: Negative.   Cardiovascular: Negative.   Gastrointestinal: Negative.   Genitourinary: Negative.   Musculoskeletal: Positive for back pain and gait problem.  Neurological: Positive for numbness.    There were no vitals taken for this visit. Physical Exam HENT:     Head: Normocephalic.  Eyes:     Extraocular Movements: Extraocular movements intact.  Cardiovascular:     Rate and Rhythm: Regular rhythm.  Pulmonary:     Effort: No respiratory distress.     Breath sounds: Normal  breath sounds.  Musculoskeletal:        General: Tenderness present.  Neurological:     Mental Status: She is alert and oriented to person, place, and time.  Psychiatric:        Mood and Affect: Mood normal.      Assessment/Plan L1-L5 stenosis  Will proceed with CENTRAL LAMINECTOMY L1-2, L2-3, L3-4 AND L4-5, TRANSFORAMINAL LUMBAR INTERBODY FUSION LEFT L3-4 AND L4-5 WITH PEDICLE SCREWS, RODS, CAGES, LOCAL AND ALLOGRAFT BONE GRAFT, VIVIGEN. as scheduled..surgical procedure discussed and all questions answered.    Zonia Kief, PA-C 09/01/2020, 6:16  AM

## 2020-09-02 LAB — BASIC METABOLIC PANEL
Anion gap: 7 (ref 5–15)
BUN: 14 mg/dL (ref 8–23)
CO2: 23 mmol/L (ref 22–32)
Calcium: 8.1 mg/dL — ABNORMAL LOW (ref 8.9–10.3)
Chloride: 107 mmol/L (ref 98–111)
Creatinine, Ser: 0.91 mg/dL (ref 0.44–1.00)
GFR, Estimated: >60 mL/min (ref >60)
Glucose, Bld: 173 mg/dL — ABNORMAL HIGH (ref 70–99)
Potassium: 4.5 mmol/L (ref 3.5–5.1)
Sodium: 137 mmol/L (ref 135–145)

## 2020-09-02 LAB — CBC WITH DIFFERENTIAL/PLATELET
Abs Immature Granulocytes: 0.06 10*3/uL (ref 0.00–0.07)
Basophils Absolute: 0.1 10*3/uL (ref 0.0–0.1)
Basophils Relative: 0 %
Eosinophils Absolute: 0 10*3/uL (ref 0.0–0.5)
Eosinophils Relative: 0 %
HCT: 22.9 % — ABNORMAL LOW (ref 36.0–46.0)
Hemoglobin: 7.3 g/dL — ABNORMAL LOW (ref 12.0–15.0)
Immature Granulocytes: 1 %
Lymphocytes Relative: 21 %
Lymphs Abs: 2.5 10*3/uL (ref 0.7–4.0)
MCH: 29.3 pg (ref 26.0–34.0)
MCHC: 31.9 g/dL (ref 30.0–36.0)
MCV: 92 fL (ref 80.0–100.0)
Monocytes Absolute: 1.1 10*3/uL — ABNORMAL HIGH (ref 0.1–1.0)
Monocytes Relative: 9 %
Neutro Abs: 8.4 10*3/uL — ABNORMAL HIGH (ref 1.7–7.7)
Neutrophils Relative %: 69 %
Platelets: 136 10*3/uL — ABNORMAL LOW (ref 150–400)
RBC: 2.49 MIL/uL — ABNORMAL LOW (ref 3.87–5.11)
RDW: 12.9 % (ref 11.5–15.5)
WBC: 12.1 10*3/uL — ABNORMAL HIGH (ref 4.0–10.5)
nRBC: 0 % (ref 0.0–0.2)

## 2020-09-02 LAB — CBC
HCT: 22.9 % — ABNORMAL LOW (ref 36.0–46.0)
Hemoglobin: 7.4 g/dL — ABNORMAL LOW (ref 12.0–15.0)
MCH: 29.2 pg (ref 26.0–34.0)
MCHC: 32.3 g/dL (ref 30.0–36.0)
MCV: 90.5 fL (ref 80.0–100.0)
Platelets: 135 10*3/uL — ABNORMAL LOW (ref 150–400)
RBC: 2.53 MIL/uL — ABNORMAL LOW (ref 3.87–5.11)
RDW: 12.6 % (ref 11.5–15.5)
WBC: 9.9 10*3/uL (ref 4.0–10.5)
nRBC: 0 % (ref 0.0–0.2)

## 2020-09-02 MED ORDER — PANTOPRAZOLE SODIUM 40 MG PO TBEC
40.0000 mg | DELAYED_RELEASE_TABLET | Freq: Every day | ORAL | Status: DC
  Administered 2020-09-02 – 2020-09-04 (×3): 40 mg via ORAL
  Filled 2020-09-02 (×3): qty 1

## 2020-09-02 MED ORDER — FERROUS GLUCONATE 324 (38 FE) MG PO TABS
324.0000 mg | ORAL_TABLET | Freq: Two times a day (BID) | ORAL | Status: DC
  Administered 2020-09-02 – 2020-09-05 (×6): 324 mg via ORAL
  Filled 2020-09-02 (×7): qty 1

## 2020-09-02 MED ORDER — METHYLPREDNISOLONE SODIUM SUCC 125 MG IJ SOLR
60.0000 mg | Freq: Two times a day (BID) | INTRAMUSCULAR | Status: AC
  Administered 2020-09-02 (×2): 60 mg via INTRAVENOUS
  Filled 2020-09-02 (×2): qty 0.96

## 2020-09-02 NOTE — Progress Notes (Signed)
Occupational Therapy Evaluation Patient Details Name: Julia Knight MRN: [id number] DOB: 1950-12-17 Today's Date: 09/02/2020    History of Present Illness Pt is a 70 y.o. female who presented 4/26 for elective surgery secondary to L1-5 spinal stenosis and lumbar spondylolisthesis L3-4 and L4-5. S/p transforaminal lumbar interbody fusion left L3-5 and central laminectomy L1-5 on 4/26. PMH: HTN, GERD, HLD, gastroenteritis, DM, depression, COVID-19, arthritis, and anxiety.   Clinical Impression   Spanish interpreter was utilized the entire session. Pt reported being mod I for all ADL/IADLs prior to admission. She lives in a one level home with her daughter who is able to provide 24/7 A upon d/c. Pt is currently min A for dressing given max verbal cues for compensatory techniques to adhere to back precautions. Spanish educational back handouts given to pt.Pt benefits from verbal cues to adhere appropriately. Pt completed stand-pivot to [facility] with min guard for safety and verbal cues for hand positioning; pt ambulated with rw to chair with min guard and reported her legs were "getting weak" after ~59ft distance. Pt benefits from continued OT services to maximize function in ADLs and mobility and required AE demonstration/practice. Recommend HHOT upon d/c to maximize safety, carryover of precautions and function in ADLs within the home.     Follow Up Recommendations  Home health OT    Equipment Recommendations  Other (comment) (PT information and OT conflict, needs to be re-assessed to determine DME needs)       Precautions / Restrictions Precautions Precautions: Fall;Back Precaution Booklet Issued: Yes (comment) Precaution Comments: Spanish hand outs given, pt verbalized understanding of back precautions and demonstrated good use of log rolling and brace application Required Braces or Orthoses: Spinal Brace Spinal Brace: Lumbar corset;Applied in sitting position Restrictions Weight  Bearing Restrictions: No      Mobility Bed Mobility Overal bed mobility: Needs Assistance Bed Mobility: Rolling;Sidelying to Sit Rolling: Min guard Sidelying to sit: Min assist       General bed mobility comments: Educated pt on log rolling with good return demonstration, pt requried min A for trunk control    Transfers Overall transfer level: Needs assistance Equipment used: Rolling walker (2 wheeled) Transfers: Sit to/from [employer] Sit to Stand: Min guard Stand pivot transfers: Min guard       General transfer comment: Verbal cues for safety, hand placement and sequencing    Balance Overall balance assessment: Needs assistance Sitting-balance support: No upper extremity supported;Feet supported Sitting balance-Leahy Scale: Fair     Standing balance support: Bilateral upper extremity supported;During functional activity Standing balance-Leahy Scale: Poor Standing balance comment: Reliant on bil UE support.              ADL either performed or assessed with clinical judgement   ADL Overall ADL's : Needs assistance/impaired Eating/Feeding: Independent;Sitting   Grooming: Wash/dry hands;Wash/dry face;Oral care;Applying deodorant;Brushing hair;Set up;Sitting   Upper Body Bathing: Set up;Sitting;Cueing for compensatory techniques   Lower Body Bathing: Minimal assistance;Sit to/from stand;Cueing for compensatory techniques   Upper Body Dressing : Set up;Sitting   Lower Body Dressing: Minimal assistance;Sit to/from stand   Toilet Transfer: Min guard;Stand-pivot;Cueing for safety;BSC;RW   Toileting- Architect and Hygiene: Min guard;Sit to/from stand       Functional mobility during ADLs: Min guard;Rolling walker;Cueing for safety General ADL Comments: Pt requries min A for dressing tasks and requires verbal cues for compensatory techniques to adhere to back precautions  Pertinent Vitals/Pain Pain  Assessment: 0-10 Pain Score: 2  Pain Location: R leg; back; surgical site Pain Descriptors / Indicators: Discomfort;Grimacing;Guarding;Operative site guarding;Numbness Pain Intervention(s): Limited activity within patient's tolerance;Monitored during session     Hand Dominance Right   Extremity/Trunk Assessment Upper Extremity Assessment Upper Extremity Assessment: Overall WFL for tasks assessed   Lower Extremity Assessment Lower Extremity Assessment: Defer to PT evaluation   Cervical / Trunk Assessment Cervical / Trunk Assessment: Other exceptions Cervical / Trunk Exceptions: Spinal surgery   Communication Communication Communication: Prefers language other than English   Cognition Arousal/Alertness: Awake/alert Behavior During Therapy: WFL for tasks assessed/performed Overall Cognitive Status: Within Functional Limits for tasks assessed            General Comments: A&Ox4, appears WFL cognitive status.   General Comments  BP at rest and after activity remained stable, no dizziness reported. Hemovac in tact, IV in RUE, dressing clean and dry      Home Living Family/patient expects to be discharged to:: Private residence Living Arrangements: Children Available Help at Discharge: Family;Available 24 hours/day Type of Home: House Home Access: Stairs to enter [employer] of Steps: 5 Entrance Stairs-Rails: Can reach both Home Layout: One level     Bathroom Shower/Tub: Tub/shower unit (Pt reported tub/shower unit to this therapist which conflicts with PT report, need sto be clairified for DME needs)   Bathroom Toilet: Standard     Home Equipment: Shower seat;Cane - quad          Prior Functioning/Environment Level of Independence: Independent with assistive device(s)        Comments: Pt utilizes quad-cane for functional mobility. Pt does not work or drive. Daughter drives pt.        OT Problem List: Decreased strength;Decreased range of  motion;Decreased activity tolerance;Impaired balance (sitting and/or standing);Decreased knowledge of use of DME or AE;Decreased safety awareness;Decreased knowledge of precautions;Pain      OT Treatment/Interventions: Self-care/ADL training;Therapeutic exercise;DME and/or AE instruction;Therapeutic activities;Patient/family education;Balance training    OT Goals(Current goals can be found in the care plan section) Acute Rehab OT Goals Patient Stated Goal: to get up and go home OT Goal Formulation: With patient Time For Goal Achievement: 09/16/20 Potential to Achieve Goals: Fair ADL Goals Pt Will Perform Lower Body Bathing: with adaptive equipment;with supervision Pt Will Perform Lower Body Dressing: with set-up;with adaptive equipment;sit to/from stand Pt Will Transfer to Toilet: with supervision;ambulating;bedside commode Pt Will Perform Toileting - Clothing Manipulation and hygiene: with supervision;sit to/from stand  OT Frequency: Min 2X/week    AM-PAC OT "6 Clicks" Daily Activity     Outcome Measure Help from another person eating meals?: None Help from another person taking care of personal grooming?: A Little Help from another person toileting, which includes using toliet, bedpan, or urinal?: A Little Help from another person bathing (including washing, rinsing, drying)?: A Little Help from another person to put on and taking off regular upper body clothing?: A Little Help from another person to put on and taking off regular lower body clothing?: A Little 6 Click Score: 19   End of Session Equipment Utilized During Treatment: Gait belt;Rolling walker;Other (comment) [facility]) Nurse Communication: Mobility status;Weight bearing status;Other (comment) (Pr position, voided, and vitals)  Activity Tolerance: Patient tolerated treatment well Patient left: in chair;with call bell/phone within reach;with family/visitor present  OT Visit Diagnosis: Unsteadiness on feet (R26.81);Other  abnormalities of gait and mobility (R26.89);Muscle weakness (generalized) (M62.81);Pain Pain - Right/Left: Right Pain - part of body: Leg (and back)  Time: 1202-1230 OT Time Calculation (min): 28 min Charges:  OT General Charges $OT Visit: 1 Visit OT Evaluation $OT Eval Moderate Complexity: 1 Mod OT Treatments $Self Care/Home Management : 8-22 mins    Elward Nocera A Daivon Rayos 09/02/2020, 1:35 PM

## 2020-09-02 NOTE — Evaluation (Addendum)
Physical Therapy Evaluation Patient Details Name: Julia Knight MRN: [id number] DOB: 09/28/1950 Today's Date: 09/02/2020   History of Present Illness  Pt is a 70 y.o. female who presented 4/26 for elective surgery secondary to L1-5 spinal stenosis and lumbar spondylolisthesis L3-4 and L4-5. S/p transforaminal lumbar interbody fusion left L3-5 and central laminectomy L1-5 on 4/26. PMH: HTN, GERD, HLD, gastroenteritis, DM, depression, COVID-19, arthritis, and anxiety.    Clinical Impression  Translator utilized during session. Pt presents with condition above and deficits mentioned below, see PT Problem List. PTA, she was living with her daughter, who can provide 24/7 assistance and transportation as needed, in a 1-story house with 5 STE with bil handrails. Pt reports being mod I with use of quad cane with functional mobility at baseline. Currently, pt is demonstrating generalized lower extremity weakness, impaired bil legs sensation (R less sensation than L), impaired balance, decreased activity tolerance, and deficits in spinal precautions or brace application awareness. Pt does report feeling like her lower extremity sensation and weakness have improved since surgery though. Pt limited in gait this date by symptomatic orthostatic BP, notified RN and MD. Otherwise, pt requiring min guard-A for all functional mobility with a RW. Pt educated on spinal precautions and proper donning/doffing of her brace and would benefit from further education to ensure compliance and understanding. Will continue to follow acutely. Pt would benefit from follow-up with Outpatient PT to maximize safety and independence with all functional mobility and decrease her risk for falls.     Follow Up Recommendations Outpatient PT;Supervision for mobility/OOB    Equipment Recommendations  Rolling walker with 5" wheels    Recommendations for Other Services       Precautions / Restrictions  Precautions Precautions: Fall;Back Precaution Booklet Issued: No Precaution Comments: Monitor BP (orthostatic BP); hemovac; Reviewed precautions, BLT Required Braces or Orthoses: Spinal Brace Spinal Brace: Lumbar corset;Applied in sitting position Restrictions Weight Bearing Restrictions: No      Mobility  Bed Mobility Overal bed mobility: Needs Assistance Bed Mobility: Rolling;Sidelying to Sit;Sit to Sidelying Rolling: Min guard Sidelying to sit: Min assist     Sit to sidelying: Min assist General bed mobility comments: Educated pt on log roll technique, with extra time and min guard, cuing to flex R knee and reach with R arm to L side of bed. MinA to initiate trunk ascension. MinA to manage legs back onto bed to return to supine.    Transfers Overall transfer level: Needs assistance Equipment used: Rolling walker (2 wheeled) Transfers: Sit to/from Stand Sit to Stand: Min assist         General transfer comment: Cued pt on proper hand placement and transtion to RW, minA to power up and steady pt to come to stand from EOB 2x. Unsteadiness but no buckling noted in knees.  Ambulation/Gait Ambulation/Gait assistance: Min assist;Min guard Gait Distance (Feet): 10 Feet Assistive device: Rolling walker (2 wheeled) Gait Pattern/deviations: Step-through pattern;Decreased stride length Gait velocity: reduced Gait velocity interpretation: <1.31 ft/sec, indicative of household ambulator General Gait Details: Pt with slow and mildly unsteady gait, but no knee buckling noted. Pt with extra concentration clearing feet and taking longer steps and managing RW when stepping posteriorly. Ambulated at side of bed laterally and anterior <> posterior for safety purposes as pt became dizzy on first sit to stand rep and needed to sit quickly, but pt denies dizziness this bout.  Stairs            Psychologist, prison and probation services  Modified Rankin (Stroke Patients Only)       Balance Overall  balance assessment: Needs assistance Sitting-balance support: No upper extremity supported;Feet supported Sitting balance-Leahy Scale: Fair Sitting balance - Comments: Pt able to reach around trunk to assist with donning/doffing brace without LOB, supervision for safety.   Standing balance support: Bilateral upper extremity supported;During functional activity Standing balance-Leahy Scale: Poor Standing balance comment: Reliant on bil UE support.                             Pertinent Vitals/Pain Pain Assessment: 0-10 Pain Score: 6  Pain Location: R leg; back; surgical site Pain Descriptors / Indicators: Discomfort;Grimacing;Guarding;Operative site guarding;Numbness Pain Intervention(s): Limited activity within patient's tolerance;Monitored during session;Repositioned    Home Living Family/patient expects to be discharged to:: Private residence Living Arrangements: Children (daughter who does not currently work) Available Help at Discharge: Family;Available 24 hours/day Type of Home: House Home Access: Stairs to enter Entrance Stairs-Rails: Can reach both Entrance Stairs-Number of Steps: 5 Home Layout: One level Home Equipment: Shower seat;Cane - quad      Prior Function Level of Independence: Independent with assistive device(s)         Comments: Pt utilizes quad-cane for functional mobility. Pt does not work or drive. Daughter drives pt.     Hand Dominance   Dominant Hand: Right    Extremity/Trunk Assessment   Upper Extremity Assessment Upper Extremity Assessment: Defer to OT evaluation    Lower Extremity Assessment Lower Extremity Assessment: RLE deficits/detail;LLE deficits/detail RLE Deficits / Details: MMT scores of grossly 4 to 4+ bil RLE Sensation: decreased light touch (less sensation in R leg throughout compared to L) LLE Deficits / Details: MMT scores of grossly 4 to 4+ bil LLE Sensation: decreased light touch    Cervical / Trunk  Assessment Cervical / Trunk Assessment: Other exceptions Cervical / Trunk Exceptions: Spinal surgery  Communication   Communication: Prefers language other than English (Spanish)  Cognition Arousal/Alertness: Awake/alert Behavior During Therapy: WFL for tasks assessed/performed Overall Cognitive Status: Within Functional Limits for tasks assessed                                 General Comments: A&Ox4, appears WFL cognitive status.      General Comments General comments (skin integrity, edema, etc.): BP supine start of session 103/50; BP sitting EOB 118/57; BP standing first bout 108/96 then reported dizziness so returned pt to sit with BP 103/80; pt denied any other dizziness with remaining session    Exercises     Assessment/Plan    PT Assessment Patient needs continued PT services  PT Problem List Decreased strength;Decreased activity tolerance;Decreased range of motion;Decreased balance;Decreased mobility;Decreased coordination;Decreased knowledge of use of DME;Decreased safety awareness;Decreased knowledge of precautions;Impaired sensation       PT Treatment Interventions DME instruction;Gait training;Stair training;Functional mobility training;Therapeutic activities;Balance training;Therapeutic exercise;Neuromuscular re-education;Patient/family education    PT Goals (Current goals can be found in the Care Plan section)  Acute Rehab PT Goals Patient Stated Goal: to get up and go home PT Goal Formulation: With patient Time For Goal Achievement: 09/16/20 Potential to Achieve Goals: Good    Frequency Min 5X/week   Barriers to discharge        Co-evaluation               AM-PAC PT "6 Clicks" Mobility  Outcome Measure Help needed turning  from your back to your side while in a flat bed without using bedrails?: A Little Help needed moving from lying on your back to sitting on the side of a flat bed without using bedrails?: A Little Help needed moving  to and from a bed to a chair (including a wheelchair)?: A Little Help needed standing up from a chair using your arms (e.g., wheelchair or bedside chair)?: A Little Help needed to walk in hospital room?: A Little Help needed climbing 3-5 steps with a railing? : A Lot 6 Click Score: 17    End of Session Equipment Utilized During Treatment: Gait belt;Back brace Activity Tolerance: Patient tolerated treatment well;Treatment limited secondary to medical complications (Comment) (dizziness, BP changes) Patient left: in bed;with call bell/phone within reach;with bed alarm set;with SCD's reapplied;with nursing/sitter in room;with family/visitor present Nurse Communication: Mobility status;Other (comment) (tongue swelling, per pt; BP changes and dizziness during session) PT Visit Diagnosis: Unsteadiness on feet (R26.81);Other abnormalities of gait and mobility (R26.89);Muscle weakness (generalized) (M62.81);Difficulty in walking, not elsewhere classified (R26.2);Other symptoms and signs involving the nervous system (R29.898);Dizziness and giddiness (R42)    Time: 6761-9509 PT Time Calculation (min) (ACUTE ONLY): 56 min   Charges:   PT Evaluation $PT Eval Moderate Complexity: 1 Mod PT Treatments $Gait Training: 8-22 mins $Therapeutic Activity: 23-37 mins        Raymond Gurney, PT, DPT Acute Rehabilitation Services  Pager: [phone] Office: [phone]   Julia Knight 09/02/2020, 9:48 AM

## 2020-09-02 NOTE — Progress Notes (Signed)
OT Cancellation Note  Patient Details Name: Cedricka Sackrider MRN: [id number] DOB: 1951-01-18   Cancelled Treatment:    Reason Eval/Treat Not Completed: Medical issues which prohibited therapy;Pain limiting ability to participate (Pt experiencing symptomatic orthostatic BP with OOB position changes). Will re-attempt eval as time allows, this afternoon.   Alleyne Lac A Desha Bitner 09/02/2020, 11:33 AM

## 2020-09-03 ENCOUNTER — Ambulatory Visit: Payer: Self-pay | Admitting: Specialist

## 2020-09-03 DIAGNOSIS — D62 Acute posthemorrhagic anemia: Secondary | ICD-10-CM

## 2020-09-03 HISTORY — DX: Acute posthemorrhagic anemia: D62

## 2020-09-03 LAB — CBC WITH DIFFERENTIAL/PLATELET
Abs Immature Granulocytes: 0.08 10*3/uL — ABNORMAL HIGH (ref 0.00–0.07)
Basophils Absolute: 0 10*3/uL (ref 0.0–0.1)
Basophils Relative: 0 %
Eosinophils Absolute: 0 10*3/uL (ref 0.0–0.5)
Eosinophils Relative: 0 %
HCT: 22.8 % — ABNORMAL LOW (ref 36.0–46.0)
Hemoglobin: 7.2 g/dL — ABNORMAL LOW (ref 12.0–15.0)
Immature Granulocytes: 1 %
Lymphocytes Relative: 10 %
Lymphs Abs: 1.6 10*3/uL (ref 0.7–4.0)
MCH: 28.8 pg (ref 26.0–34.0)
MCHC: 31.6 g/dL (ref 30.0–36.0)
MCV: 91.2 fL (ref 80.0–100.0)
Monocytes Absolute: 0.6 10*3/uL (ref 0.1–1.0)
Monocytes Relative: 4 %
Neutro Abs: 13.5 10*3/uL — ABNORMAL HIGH (ref 1.7–7.7)
Neutrophils Relative %: 85 %
Platelets: 130 10*3/uL — ABNORMAL LOW (ref 150–400)
RBC: 2.5 MIL/uL — ABNORMAL LOW (ref 3.87–5.11)
RDW: 13.1 % (ref 11.5–15.5)
WBC: 15.8 10*3/uL — ABNORMAL HIGH (ref 4.0–10.5)
nRBC: 0 % (ref 0.0–0.2)

## 2020-09-03 LAB — TYPE AND SCREEN
ABO/RH(D): O POS
Antibody Screen: NEGATIVE

## 2020-09-03 LAB — BASIC METABOLIC PANEL
Anion gap: 5 (ref 5–15)
BUN: 10 mg/dL (ref 8–23)
CO2: 25 mmol/L (ref 22–32)
Calcium: 8.9 mg/dL (ref 8.9–10.3)
Chloride: 109 mmol/L (ref 98–111)
Creatinine, Ser: 0.83 mg/dL (ref 0.44–1.00)
GFR, Estimated: >60 mL/min (ref >60)
Glucose, Bld: 184 mg/dL — ABNORMAL HIGH (ref 70–99)
Potassium: 4.3 mmol/L (ref 3.5–5.1)
Sodium: 139 mmol/L (ref 135–145)

## 2020-09-03 MED FILL — Sodium Chloride Irrigation Soln 0.9%: Qty: 3000 | Status: AC

## 2020-09-03 MED FILL — Heparin Sodium (Porcine) Inj 1000 Unit/ML: INTRAMUSCULAR | Qty: 30 | Status: AC

## 2020-09-03 MED FILL — Sodium Chloride IV Soln 0.9%: INTRAVENOUS | Qty: 1000 | Status: AC

## 2020-09-03 NOTE — Plan of Care (Signed)

## 2020-09-03 NOTE — Progress Notes (Signed)
Physical Therapy Treatment Patient Details Name: Julia Knight MRN: [id number] DOB: 08-14-1950 Today's Date: 09/03/2020    History of Present Illness Pt is a 70 y.o. female who presented 4/26 for elective surgery secondary to L1-5 spinal stenosis and lumbar spondylolisthesis L3-4 and L4-5. S/p transforaminal lumbar interbody fusion left L3-5 and central laminectomy L1-5 on 4/26. PMH: HTN, GERD, HLD, gastroenteritis, DM, depression, COVID-19, arthritis, and anxiety.    PT Comments    Pt with significantly improved mobility this date, only needing min guard for safety with transfers, gait utilizing RW, and 2 stairs with rail. Pt continues to require cues to maintain spinal precautions and reviewed donning of brace again. Pt could benefit from further education on brace and precautions. Pt no longer needing follow-up PT as she appears to be improving quickly and expect she will continue to do so as her pain decreases. Educated pt to have family supervise/guard her with all standing mobility for safety purposes as she is at risk for falls, indicated by her DGI score of 19 this date. Will continue to follow acutely.   Follow Up Recommendations  No PT follow up;Supervision for mobility/OOB     Equipment Recommendations  Rolling walker with 5" wheels    Recommendations for Other Services       Precautions / Restrictions Precautions Precautions: Fall;Back Precaution Booklet Issued: Yes (comment) Precaution Comments: Monitor BP; Reviewed precautions, BLT, needs cues to adhere to precautions Required Braces or Orthoses: Spinal Brace Spinal Brace: Lumbar corset;Applied in sitting position Restrictions Weight Bearing Restrictions: No    Mobility  Bed Mobility Overal bed mobility: Needs Assistance Bed Mobility: Rolling;Sidelying to Sit Rolling: Min guard Sidelying to sit: Min assist     Sit to sidelying: Min assist General bed mobility comments: Pt sitting up in recliner  upon arrival without back brace donned, educated pt to have back brace donned when upright and notified RN.    Transfers Overall transfer level: Needs assistance Equipment used: Rolling walker (2 wheeled) Transfers: Sit to/from Stand Sit to Stand: Min guard         General transfer comment: Cued pt on proper hand placement and transtion to RW, extra time, min guard for safety, no overt LOB.  Ambulation/Gait Ambulation/Gait assistance: Min guard Gait Distance (Feet): 200 Feet Assistive device: Rolling walker (2 wheeled) Gait Pattern/deviations: Step-through pattern;Decreased stride length Gait velocity: reduced Gait velocity interpretation: <1.31 ft/sec, indicative of household ambulator General Gait Details: Pt with slow and slightly unsteady gait, mainly having difficulty with managing RW with tight turns quickly. Cues to remain proximal to RW with turns. No overt LOB, min guard for safety.   Stairs Stairs: Yes Stairs assistance: Min guard Stair Management: One rail Right;One rail Left;Step to pattern;Alternating pattern Number of Stairs: 2 General stair comments: Ascending with R rail and descending with L rail with reciprocal and step-to patterns (alternating). Min guard for safety, no overt LOB.   Wheelchair Mobility    Modified Rankin (Stroke Patients Only)       Balance Overall balance assessment: Needs assistance Sitting-balance support: No upper extremity supported;Feet supported Sitting balance-Leahy Scale: Good     Standing balance support: Bilateral upper extremity supported;During functional activity Standing balance-Leahy Scale: Poor Standing balance comment: Reliant on UE support.                 Standardized Balance Assessment Standardized Balance Assessment : Dynamic Gait Index   Dynamic Gait Index Level Surface: Normal Change in Gait Speed: Normal Gait with Horizontal  Head Turns: Normal Gait with Vertical Head Turns: Normal Gait and  Pivot Turn: Mild Impairment Step Over Obstacle: Mild Impairment Step Around Obstacles: Mild Impairment Steps: Moderate Impairment Total Score: 19      Cognition Arousal/Alertness: Awake/alert Behavior During Therapy: WFL for tasks assessed/performed Overall Cognitive Status: Within Functional Limits for tasks assessed                                 General Comments: A&Ox4, appears WFL cognitive status.      Exercises      General Comments General comments (skin integrity, edema, etc.): BP sitting in recliner start of session 133/56, BP standing 168/64, BP end of session sitting 161/66; pt reported decreased headache pain when standing and ambulating but increased pain when sitting and reclined; RN notified      Pertinent Vitals/Pain Pain Assessment: 0-10 Pain Score: 10-Worst pain ever Pain Location: headache; back Pain Descriptors / Indicators: Discomfort;Grimacing;Guarding;Operative site guarding;Headache Pain Intervention(s): Limited activity within patient's tolerance;Monitored during session;Repositioned    Home Living                      Prior Function            PT Goals (current goals can now be found in the care plan section) Acute Rehab PT Goals Patient Stated Goal: to get up and go home PT Goal Formulation: With patient Time For Goal Achievement: 09/16/20 Potential to Achieve Goals: Good Progress towards PT goals: Progressing toward goals    Frequency    Min 5X/week      PT Plan Discharge plan needs to be updated    Co-evaluation              AM-PAC PT "6 Clicks" Mobility   Outcome Measure  Help needed turning from your back to your side while in a flat bed without using bedrails?: A Little Help needed moving from lying on your back to sitting on the side of a flat bed without using bedrails?: A Little Help needed moving to and from a bed to a chair (including a wheelchair)?: A Little Help needed standing up from a  chair using your arms (e.g., wheelchair or bedside chair)?: A Little Help needed to walk in hospital room?: A Little Help needed climbing 3-5 steps with a railing? : A Little 6 Click Score: 18    End of Session Equipment Utilized During Treatment: Gait belt;Back brace Activity Tolerance: Patient tolerated treatment well Patient left: with call bell/phone within reach;in chair Nurse Communication: Mobility status;Other (comment) (BP changes and headache; pt reporting continued tongue swelling) PT Visit Diagnosis: Unsteadiness on feet (R26.81);Other abnormalities of gait and mobility (R26.89);Muscle weakness (generalized) (M62.81);Difficulty in walking, not elsewhere classified (R26.2);Other symptoms and signs involving the nervous system (R29.898);Dizziness and giddiness (R42)     Time: 4174-0814 PT Time Calculation (min) (ACUTE ONLY): 43 min  Charges:  $Gait Training: 23-37 mins $Therapeutic Activity: 8-22 mins                     Julia Knight, PT, DPT Acute Rehabilitation Services  Pager: [phone] Office: [phone]    Julia Knight 09/03/2020, 4:11 PM

## 2020-09-03 NOTE — Progress Notes (Signed)
Occupational Therapy Treatment Patient Details Name: Julia Knight MRN: [id number] DOB: [birth year] Today's Date: 09/03/2020    History of present illness Pt is a 70 y.o. female who presented 4/26 for elective surgery secondary to L1-5 spinal stenosis and lumbar spondylolisthesis L3-4 and L4-5. S/p transforaminal lumbar interbody fusion left L3-5 and central laminectomy L1-5 on 4/26. PMH: HTN, GERD, HLD, gastroenteritis, DM, depression, COVID-19, arthritis, and anxiety.   OT comments  Pt is progressing towards her goals. Pt reported 6/10 pain this session however still agreeable to participate in therapy. Pt was educated on the use of AE to increase indep in lower body dressing. Pt demonstrated great ability to compete lower body dressing with AE and set up given verbal cues for compensatory techniques and sequencing. Pt continues to benefit from OT services acutely to progress indep in ADLs and functional mobility. D/c recommendation should be updated to no OT follow up as pt OT needs can be met at this venue.    Follow Up Recommendations  No OT follow up    Equipment Recommendations  Other (comment) (Reacher and sock aide provided)       Precautions / Restrictions Precautions Precautions: Fall;Back Precaution Booklet Issued: Yes (comment) Precaution Comments: Pt continues to require cues to adhere to back precautions Spinal Brace: Lumbar corset;Applied in sitting position Restrictions Weight Bearing Restrictions: No       Mobility Bed Mobility Overal bed mobility: Needs Assistance Bed Mobility: Rolling;Sidelying to Sit Rolling: Min guard Sidelying to sit: Min assist     Sit to sidelying: Min assist General bed mobility comments: continues to required verbal cues for log rolling    Transfers Overall transfer level: Needs assistance Equipment used: Rolling walker (2 wheeled)   Sit to Stand: Min guard              Balance Overall balance assessment:  Needs assistance Sitting-balance support: No upper extremity supported;Feet supported Sitting balance-Leahy Scale: Good     Standing balance support: Bilateral upper extremity supported;During functional activity Standing balance-Leahy Scale: Poor           ADL either performed or assessed with clinical judgement   ADL Overall ADL's : Needs assistance/impaired Eating/Feeding: Independent;Sitting   Grooming: Wash/dry hands;Wash/dry face;Oral care;Applying deodorant;Brushing hair;Set up;Sitting               Lower Body Dressing: Set up;Sitting/lateral leans Lower Body Dressing Details (indicate cue type and reason): pt demonstrated good ability to use AE for lower body dressing this session while adhering to back precautions             Functional mobility during ADLs: Min guard;Rolling walker;Cueing for safety General ADL Comments: Pt adhering to back precautions wtih verbal cues for log rolling and with use of AE. pt deomstrated great use of reacher adn sock aide for lower body dressing given verbal cues for sequencing and positioning               Cognition Arousal/Alertness: Awake/alert Behavior During Therapy: WFL for tasks assessed/performed Overall Cognitive Status: Within Functional Limits for tasks assessed              General Comments: A&Ox4, appears WFL cognitive status.              General Comments Pt educated on AE this session adn demonstrated good ability to utilize reacher and sock aide to increase independence in lower body ADLs while adhereing to back precautions    Pertinent Vitals/ Pain  Pain Assessment: 0-10 Pain Score: 6  Pain Location: R leg; back; surgical site Pain Descriptors / Indicators: Discomfort;Grimacing;Guarding;Operative site guarding;Numbness Pain Intervention(s): Limited activity within patient's tolerance;Monitored during session;Repositioned         Frequency  Min 2X/week        Progress Toward  Goals  OT Goals(current goals can now be found in the care plan section)  Progress towards OT goals: Progressing toward goals  Acute Rehab OT Goals Patient Stated Goal: to get up and go home OT Goal Formulation: With patient Time For Goal Achievement: 09/16/20 Potential to Achieve Goals: Fair ADL Goals Pt Will Perform Lower Body Bathing: with adaptive equipment;with supervision Pt Will Perform Lower Body Dressing: with set-up;with adaptive equipment;sit to/from stand Pt Will Transfer to Toilet: with supervision;ambulating;bedside commode Pt Will Perform Toileting - Clothing Manipulation and hygiene: with supervision;sit to/from stand  Plan Discharge plan needs to be updated       AM-PAC OT "6 Clicks" Daily Activity     Outcome Measure   Help from another person eating meals?: None Help from another person taking care of personal grooming?: A Little Help from another person toileting, which includes using toliet, bedpan, or urinal?: A Little Help from another person bathing (including washing, rinsing, drying)?: A Little Help from another person to put on and taking off regular upper body clothing?: A Little Help from another person to put on and taking off regular lower body clothing?: A Little 6 Click Score: 19    End of Session Equipment Utilized During Treatment: Gait belt;Other (comment) (reacher and sockaide)  OT Visit Diagnosis: Unsteadiness on feet (R26.81);Other abnormalities of gait and mobility (R26.89);Muscle weakness (generalized) (M62.81);Pain Pain - Right/Left: Right Pain - part of body: Leg (and back)   Activity Tolerance Patient tolerated treatment well   Patient Left in bed;with call bell/phone within reach;with bed alarm set;with family/visitor present   Nurse Communication Mobility status;Weight bearing status;Other (comment)        Time: 4098-1191 OT Time Calculation (min): 17 min  Charges: OT General Charges $OT Visit: 1 Visit OT  Treatments $Self Care/Home Management : 8-22 mins    Chip Canepa A Ammy Lienhard 09/03/2020, 2:35 PM

## 2020-09-03 NOTE — Plan of Care (Signed)
Patient ambulated to [facility] well. Patient states pain is better. Will continue to monitor patient.   Problem: Education: Goal: Knowledge of General Education information will improve Description: Including pain rating scale, medication(s)/side effects and non-pharmacologic comfort measures Outcome: Progressing   Problem: Activity: Goal: Risk for activity intolerance will decrease Outcome: Progressing   Problem: Pain Managment: Goal: General experience of comfort will improve Outcome: Progressing   Problem: Safety: Goal: Ability to remain free from injury will improve Outcome: Progressing   Problem: Skin Integrity: Goal: Risk for impaired skin integrity will decrease Outcome: Progressing

## 2020-09-03 NOTE — Progress Notes (Signed)
     Subjective: 2 Days Post-Op Procedure(s) (LRB): CENTRAL LAMINECTOMY LUMBAR ONE-TWO, LUMBAR TWO-THREE, LUMBAR THREE-FOUR AND LUMBAR FOUR-FIVE, TRANSFORAMINAL LUMBAR INTERBODY FUSION LEFT LUMBAR THREE-FOUR AND LUMBAR FOUR-FIVE WITH PEDICLE SCREWS, RODS, CAGES, LOCAL AND ALLOGRAFT BONE GRAFT, VIVIGEN (N/A) Awake, alert and oriented x 4. Anemia today Hgb 7.3-7.4 Start Ferrous gluconate. Hemovac to cannister intact. Had Vancomycin local to incision. Nursing noted started lisinopril last night and has Concern of some tongue swelling. Lisinopril discontinued and given solumedrol IV x 2 doses  Patient reports pain as moderate.    Objective:   VITALS:  Temp:  [97.8 F (36.6 C)-98.2 F (36.8 C)] 98.1 F (36.7 C) (04/27 2019) Pulse Rate:  [71-90] 90 (04/27 2019) Resp:  [17-18] 17 (04/27 2019) BP: (81-106)/(41-53) 106/52 (04/27 2019) SpO2:  [95 %-100 %] 95 % (04/27 2019)  Neurologically intact ABD soft Neurovascular intact Sensation intact distally Intact pulses distally Dorsiflexion/Plantar flexion intact Incision: dressing C/D/I and moderate drainage   LABS Recent Labs    09/02/20 0246 09/02/20 1038  HGB 7.4* 7.3*  WBC 9.9 12.1*  PLT 135* 136*   Recent Labs    09/02/20 0246  NA 137  K 4.5  CL 107  CO2 23  BUN 14  CREATININE 0.91  GLUCOSE 173*   No results for input(s): LABPT, INR in the last 72 hours.   Assessment/Plan: 2 Days Post-Op Procedure(s) (LRB): CENTRAL LAMINECTOMY LUMBAR ONE-TWO, LUMBAR TWO-THREE, LUMBAR THREE-FOUR AND LUMBAR FOUR-FIVE, TRANSFORAMINAL LUMBAR INTERBODY FUSION LEFT LUMBAR THREE-FOUR AND LUMBAR FOUR-FIVE WITH PEDICLE SCREWS, RODS, CAGES, LOCAL AND ALLOGRAFT BONE GRAFT, VIVIGEN (N/A)  Advance diet Up with therapy  Continue IVF due to anemia Start Ferrous gluconate Rechecked h/h at noon and tomorrow.   [name] 09/03/2020, 2:22 AMPatient ID: Julia Knight, female   DOB: 11-14-1950, 70 y.o.   MRN: [id number]

## 2020-09-04 LAB — CBC WITH DIFFERENTIAL/PLATELET
Abs Immature Granulocytes: 0.06 10*3/uL (ref 0.00–0.07)
Basophils Absolute: 0.1 10*3/uL (ref 0.0–0.1)
Basophils Relative: 1 %
Eosinophils Absolute: 0.4 10*3/uL (ref 0.0–0.5)
Eosinophils Relative: 3 %
HCT: 23 % — ABNORMAL LOW (ref 36.0–46.0)
Hemoglobin: 7.2 g/dL — ABNORMAL LOW (ref 12.0–15.0)
Immature Granulocytes: 1 %
Lymphocytes Relative: 26 %
Lymphs Abs: 3.4 10*3/uL (ref 0.7–4.0)
MCH: 29 pg (ref 26.0–34.0)
MCHC: 31.3 g/dL (ref 30.0–36.0)
MCV: 92.7 fL (ref 80.0–100.0)
Monocytes Absolute: 1.1 10*3/uL — ABNORMAL HIGH (ref 0.1–1.0)
Monocytes Relative: 8 %
Neutro Abs: 8.2 10*3/uL — ABNORMAL HIGH (ref 1.7–7.7)
Neutrophils Relative %: 61 %
Platelets: 165 10*3/uL (ref 150–400)
RBC: 2.48 MIL/uL — ABNORMAL LOW (ref 3.87–5.11)
RDW: 13.1 % (ref 11.5–15.5)
WBC: 13.2 10*3/uL — ABNORMAL HIGH (ref 4.0–10.5)
nRBC: 0 % (ref 0.0–0.2)

## 2020-09-04 MED ORDER — METHOCARBAMOL 500 MG PO TABS: 500.0000 mg | ORAL_TABLET | Freq: Three times a day (TID) | ORAL | 1 refills | Status: DC | PRN

## 2020-09-04 MED ORDER — GABAPENTIN 300 MG PO CAPS: 300.0000 mg | ORAL_CAPSULE | Freq: Two times a day (BID) | ORAL | 1 refills | Status: DC

## 2020-09-04 MED ORDER — GABAPENTIN 300 MG PO CAPS
300.0000 mg | ORAL_CAPSULE | Freq: Two times a day (BID) | ORAL | Status: DC
  Administered 2020-09-04 – 2020-09-05 (×2): 300 mg via ORAL
  Filled 2020-09-04 (×2): qty 1

## 2020-09-04 MED ORDER — FERROUS GLUCONATE 324 (38 FE) MG PO TABS: 324.0000 mg | ORAL_TABLET | Freq: Two times a day (BID) | ORAL | 0 refills | Status: DC

## 2020-09-04 MED ORDER — HYDROCODONE-ACETAMINOPHEN 7.5-325 MG PO TABS: 1.0000 | ORAL_TABLET | Freq: Four times a day (QID) | ORAL | 0 refills | Status: DC | PRN

## 2020-09-04 MED ORDER — MAGIC MOUTHWASH
5.0000 mL | Freq: Four times a day (QID) | ORAL | Status: DC | PRN
  Filled 2020-09-04: qty 5

## 2020-09-04 MED ORDER — FERROUS GLUCONATE 324 (38 FE) MG PO TABS: 324.0000 mg | ORAL_TABLET | Freq: Two times a day (BID) | ORAL | 1 refills | Status: DC

## 2020-09-04 MED ORDER — DOCUSATE SODIUM 100 MG PO CAPS: 100.0000 mg | ORAL_CAPSULE | Freq: Two times a day (BID) | ORAL | 0 refills | Status: DC

## 2020-09-04 NOTE — Progress Notes (Signed)
Patient ID: Julia Knight, female   DOB: 11-23-1950, 70 y.o.   MRN: [id number] Rx sent to [facility] at [facility]. HgB remains 7.2 over 3 days and is stable, she will remain on ferrous gluconate for 4-6 weeks.

## 2020-09-04 NOTE — Progress Notes (Signed)
Physical Therapy Treatment Patient Details Name: Julia Knight MRN: [id number] DOB: 1951-03-13 Today's Date: 09/04/2020    History of Present Illness Pt is a 70 y.o. female who presented 4/26 for elective surgery secondary to L1-5 spinal stenosis and lumbar spondylolisthesis L3-4 and L4-5. S/p transforaminal lumbar interbody fusion left L3-5 and central laminectomy L1-5 on 4/26. PMH: HTN, GERD, HLD, gastroenteritis, DM, depression, COVID-19, arthritis, and anxiety.    PT Comments    Pt and daughter participated in session. Pt min A-S during functional mobility tasks. Daughter is able to assist as needed. Pt with improved gait pattern and safety with RW. Pt able to recall precautions. Pt is safe to d/c home with daughter assist. Will continue to benefit from skilled PT while in the acute setting for strength and coordination to increase I with functional mobility     Follow Up Recommendations  No PT follow up;Supervision for mobility/OOB     Equipment Recommendations  Rolling walker with 5" wheels;3in1 (PT)    Recommendations for Other Services       Precautions / Restrictions Precautions Precautions: Fall;Back Precaution Booklet Issued: Yes (comment) Precaution Comments: Monitor BP; Reviewed precautions, BLT, needs cues to adhere to precautions; pt and daughter able to recall precautions Spinal Brace: Lumbar corset;Applied in sitting position Restrictions Weight Bearing Restrictions: No    Mobility  Bed Mobility Overal bed mobility: Needs Assistance Bed Mobility: Sidelying to Sit;Sit to Sidelying   Sidelying to sit: Min assist     Sit to sidelying: Min assist General bed mobility comments: daughter able to assist to mimic home    Transfers Overall transfer level: Needs assistance Equipment used: Rolling walker (2 wheeled) Transfers: Sit to/from Stand Sit to Stand: Supervision            Ambulation/Gait Ambulation/Gait assistance: Supervision Gait  Distance (Feet): 200 Feet Assistive device: Rolling walker (2 wheeled) Gait Pattern/deviations: Step-through pattern;Decreased stride length     General Gait Details: decrease velocity   Stairs Stairs: Yes Stairs assistance: Min assist Stair Management: Alternating pattern;No rails Number of Stairs: 3 General stair comments: performed with HHA to mimic hom environment. daugther verbalized understanding on how to assist/guard patient with stairs   Wheelchair Mobility    Modified Rankin (Stroke Patients Only)       Balance                                            Cognition                                              Exercises      General Comments        Pertinent Vitals/Pain Pain Score: 9  Pain Location: L leg MD aware    Home Living Family/patient expects to be discharged to:: Private residence Living Arrangements: Children                  Prior Function            PT Goals (current goals can now be found in the care plan section) Acute Rehab PT Goals Patient Stated Goal: to get up and go home PT Goal Formulation: With patient/family Time For Goal Achievement: 09/16/20 Potential to Achieve Goals: Good Progress towards PT  goals: Progressing toward goals    Frequency    Min 5X/week      PT Plan Current plan remains appropriate    Co-evaluation              AM-PAC PT "6 Clicks" Mobility   Outcome Measure  Help needed turning from your back to your side while in a flat bed without using bedrails?: None Help needed moving from lying on your back to sitting on the side of a flat bed without using bedrails?: A Little Help needed moving to and from a bed to a chair (including a wheelchair)?: A Little Help needed standing up from a chair using your arms (e.g., wheelchair or bedside chair)?: A Little Help needed to walk in hospital room?: A Little Help needed climbing 3-5 steps with a railing? :  A Little 6 Click Score: 19    End of Session Equipment Utilized During Treatment: Gait belt;Back brace Activity Tolerance: Patient tolerated treatment well Patient left: in bed;with bed alarm set;with call bell/phone within reach;with family/visitor present Nurse Communication: Mobility status;Patient requests pain meds PT Visit Diagnosis: Unsteadiness on feet (R26.81);Other abnormalities of gait and mobility (R26.89);Muscle weakness (generalized) (M62.81);Difficulty in walking, not elsewhere classified (R26.2);Other symptoms and signs involving the nervous system (R29.898);Dizziness and giddiness (R42)     Time: 2229-7989 PT Time Calculation (min) (ACUTE ONLY): 16 min  Charges:  $Gait Training: 8-22 mins                     Ginette Otto, DPT Acute Rehabilitation Services [phone]   Lucretia Field 09/04/2020, 11:30 AM

## 2020-09-04 NOTE — TOC Transition Note (Addendum)
Transition of Care [facility]) - CM/SW Discharge Note   Patient Details  Name: Julia Knight MRN: [id number] Date of Birth: 01-15-1951  [facility] CM/SW Contact:  Epifanio Lesches, RN Phone Number: 09/04/2020, 11:55 AM   Clinical Narrative:    Patient will DC to: home Anticipated DC date: 09/06/2019 Family notified:yes, daughter Transport by: car         - s/p transforaminal lumbar interbody fusion left L3-5 and central laminectomy L1-5, 4/26  Per MD patient ready for DC on tomorrow 4/30 if HGB remains stable . RN, patient, and patient's family, notified of DC plan. Pt from home with supportive family. DME: rolling walker and 3in 1/bsc will be delivered to bedside prior to d/c. Pt without Rx MED concerns . Daughter to pick up med from local pharmacy. Post hospital f/u noted on AVS.  RNCM will sign off for now as intervention is no longer needed. Please consult [place] again if new needs arise.    Final next level of care: Home/Self Care Barriers to Discharge: No Barriers Identified   Patient Goals and CMS Choice        Discharge Placement                       Discharge Plan and Services                DME Arranged: 3-N-1,Walker rolling DME Agency: [employer] Date DME Agency Contacted: 09/04/20 Time DME Agency Contacted: 1153 Representative spoke with at DME Agency: Nurse to provide pt with DME from [facility] prior to discharge            Social Determinants of Health (SDOH) Interventions     Readmission Risk Interventions No flowsheet data found.

## 2020-09-04 NOTE — Plan of Care (Signed)
  Problem: Clinical Measurements: Goal: Will remain free from infection Outcome: Progressing Goal: Diagnostic test results will improve Outcome: Progressing   

## 2020-09-04 NOTE — Progress Notes (Signed)
Patient ID: Julia Knight, female   DOB: Jun 18, 1950, 70 y.o.   MRN: [id number]     Subjective: 2 Days Post-Op Procedure(s) (LRB): CENTRAL LAMINECTOMY LUMBAR ONE-TWO, LUMBAR TWO-THREE, LUMBAR THREE-FOUR AND LUMBAR FOUR-FIVE, TRANSFORAMINAL LUMBAR INTERBODY FUSION LEFT LUMBAR THREE-FOUR AND LUMBAR FOUR-FIVE WITH PEDICLE SCREWS, RODS, CAGES, LOCAL AND ALLOGRAFT BONE GRAFT, VIVIGEN (N/A) Awake, alert and oriented x 4, complains of discomfort in her back, legs are okay. She is up with PT, some orthostatic changes but hopefully better off antihypertensive meds.  Patient reports pain as moderate.    Objective:   VITALS:  Temp:  [98.1 F (36.7 C)-98.2 F (36.8 C)] 98.2 F (36.8 C) (04/28 0356) Pulse Rate:  [74-90] 74 (04/28 0356) Resp:  [17-18] 18 (04/28 0356) BP: (103-116)/(49-53) 116/49 (04/28 0356) SpO2:  [95 %-98 %] 96 % (04/28 0356)  Neurologically intact ABD soft Neurovascular intact Sensation intact distally Intact pulses distally Dorsiflexion/Plantar flexion intact Incision: dressing C/D/I, no drainage, and hemovac removed without difficulty.    LABS Recent Labs    09/02/20 0246 09/02/20 1038 09/03/20 0713  HGB 7.4* 7.3* 7.2*  WBC 9.9 12.1* 15.8*  PLT 135* 136* 130*   Recent Labs    09/02/20 0246 09/03/20 0713  NA 137 139  K 4.5 4.3  CL 107 109  CO2 23 25  BUN 14 10  CREATININE 0.91 0.83  GLUCOSE 173* 184*   No results for input(s): LABPT, INR in the last 72 hours.   Assessment/Plan: 2 Days Post-Op Procedure(s) (LRB): CENTRAL LAMINECTOMY LUMBAR ONE-TWO, LUMBAR TWO-THREE, LUMBAR THREE-FOUR AND LUMBAR FOUR-FIVE, TRANSFORAMINAL LUMBAR INTERBODY FUSION LEFT LUMBAR THREE-FOUR AND LUMBAR FOUR-FIVE WITH PEDICLE SCREWS, RODS, CAGES, LOCAL AND ALLOGRAFT BONE GRAFT, VIVIGEN (N/A) Anemia due to blood loss.  Advance diet Up with therapy May need to consider SNF for rehabilitation.   [name] 09/03/2020, 9:09 AM

## 2020-09-04 NOTE — Progress Notes (Signed)
     Subjective: 3 Days Post-Op Procedure(s) (LRB): CENTRAL LAMINECTOMY LUMBAR ONE-TWO, LUMBAR TWO-THREE, LUMBAR THREE-FOUR AND LUMBAR FOUR-FIVE, TRANSFORAMINAL LUMBAR INTERBODY FUSION LEFT LUMBAR THREE-FOUR AND LUMBAR FOUR-FIVE WITH PEDICLE SCREWS, RODS, CAGES, LOCAL AND ALLOGRAFT BONE GRAFT, VIVIGEN (N/A) Awake, alert and oriented x 4. Speaks spanish only has complained of tongue swelling since surgery, this may be secondary to intubation and tube or may be drug induced and perhaps secondary to vancomycin placed in incision at end of case. It is not worsening and I expect it will  Eventually return to normal. She has some feelings of fatigue, she is anemic Hgb 7.2 yesterday, stable but will check today.  Doing well with PT and I will plan to discharge tomorrow if Hgb remains stable.   Patient reports pain as moderate.    Objective:   VITALS:  Temp:  [98.3 F (36.8 C)-98.4 F (36.9 C)] 98.3 F (36.8 C) (04/28 2030) Pulse Rate:  [84-94] 85 (04/28 2030) Resp:  [17-19] 17 (04/28 2030) BP: (122-142)/(53-58) 122/58 (04/28 2030) SpO2:  [96 %-99 %] 96 % (04/28 2030)  Neurologically intact ABD soft Neurovascular intact Sensation intact distally Intact pulses distally Dorsiflexion/Plantar flexion intact Incision: dressing C/D/I and no drainage   LABS Recent Labs    09/02/20 0246 09/02/20 1038 09/03/20 0713  HGB 7.4* 7.3* 7.2*  WBC 9.9 12.1* 15.8*  PLT 135* 136* 130*   Recent Labs    09/02/20 0246 09/03/20 0713  NA 137 139  K 4.5 4.3  CL 107 109  CO2 23 25  BUN 14 10  CREATININE 0.91 0.83  GLUCOSE 173* 184*   No results for input(s): LABPT, INR in the last 72 hours.   Assessment/Plan: 3 Days Post-Op Procedure(s) (LRB): CENTRAL LAMINECTOMY LUMBAR ONE-TWO, LUMBAR TWO-THREE, LUMBAR THREE-FOUR AND LUMBAR FOUR-FIVE, TRANSFORAMINAL LUMBAR INTERBODY FUSION LEFT LUMBAR THREE-FOUR AND LUMBAR FOUR-FIVE WITH PEDICLE SCREWS, RODS, CAGES, LOCAL AND ALLOGRAFT BONE GRAFT, VIVIGEN  (N/A)  Advance diet Up with therapy Plan for discharge tomorrow Discharge home with home health  Check a H/H today and transfuse if she has even more sign of decrease  Hgb.   [name] 09/04/2020, 9:48 AMPatient ID: Julia Knight, female   DOB: [birth year], 69 y.o.   MRN: [id number]

## 2020-09-05 NOTE — Progress Notes (Signed)
Occupational Therapy Treatment Patient Details Name: Julia Knight MRN: [id number] DOB: Sep 12, 1950 Today's Date: 09/05/2020    History of present illness Pt is a 69 y.o. female who presented 4/26 for elective surgery secondary to L1-5 spinal stenosis and lumbar spondylolisthesis L3-4 and L4-5. S/p transforaminal lumbar interbody fusion left L3-5 and central laminectomy L1-5 on 4/26. PMH: HTN, GERD, HLD, gastroenteritis, DM, depression, COVID-19, arthritis, and anxiety.   OT comments  Patient with nice progress to all patient focused OT goals.  She and her daughter demonstrate good understanding of all precautions, and use of hip kit to increase ADL independence.  Her daughter will be with her, and able to provide ant assist needed.  [name] issued, patient dressed and ready to discharge home.  No further acute OT needs.  No post acute OT needs identified.  All questions answered.    Follow Up Recommendations  No OT follow up    Equipment Recommendations  None recommended by OT    Recommendations for Other Services      Precautions / Restrictions Precautions Precautions: Fall;Back Required Braces or Orthoses: Spinal Brace Spinal Brace: Lumbar corset;Applied in sitting position Restrictions Weight Bearing Restrictions: No       Mobility Bed Mobility               General bed mobility comments: up in recliner with daughter assisting    Transfers Overall transfer level: Needs assistance Equipment used: Rolling walker (2 wheeled) Transfers: Sit to/from Stand Sit to Stand: Supervision         General transfer comment: able to walk to BR and back with supervision    Balance   Sitting-balance support: No upper extremity supported;Feet supported Sitting balance-Leahy Scale: Good     Standing balance support: Bilateral upper extremity supported Standing balance-Leahy Scale: Poor Standing balance comment: Reliant on UE support.                            ADL either performed or assessed with clinical judgement   ADL                   Upper Body Dressing : Set up;Sitting   Lower Body Dressing: Sit to/from stand;Supervision/safety   Toilet Transfer: Supervision/safety;RW;Ambulation   Toileting- Clothing Manipulation and Hygiene: Supervision/safety;Sit to/from stand                                 Cognition Arousal/Alertness: Awake/alert Behavior During Therapy: WFL for tasks assessed/performed Overall Cognitive Status: Within Functional Limits for tasks assessed                                                      General Comments  VSS on RA    Pertinent Vitals/ Pain       Pain Assessment: Faces Faces Pain Scale: Hurts whole lot Pain Location: L leg Pain Descriptors / Indicators: Discomfort;Grimacing;Guarding;Operative site guarding Pain Intervention(s): Monitored during session  Frequency           Progress Toward Goals  OT Goals(current goals can now be found in the care plan section)  Progress towards OT goals: Goals met/education completed, patient discharged from OT  Acute Rehab OT Goals Patient Stated Goal: to get up and go home OT Goal Formulation: With patient Time For Goal Achievement: 09/16/20 Potential to Achieve Goals: Good  Plan Discharge plan remains appropriate    Co-evaluation                 AM-PAC OT "6 Clicks" Daily Activity     Outcome Measure   Help from another person eating meals?: None Help from another person taking care of personal grooming?: None Help from another person toileting, which includes using toliet, bedpan, or urinal?: None Help from another person bathing (including washing, rinsing, drying)?: A Little Help from another person to put on and taking off regular upper body clothing?: A Little Help from another person to put on and  taking off regular lower body clothing?: A Little 6 Click Score: 21    End of Session Equipment Utilized During Treatment: Back brace  OT Visit Diagnosis: Unsteadiness on feet (R26.81);Other abnormalities of gait and mobility (R26.89);Muscle weakness (generalized) (M62.81);Pain Pain - Right/Left: Left Pain - part of body: Leg   Activity Tolerance Patient tolerated treatment well   Patient Left in chair;with call bell/phone within reach;with family/visitor present   Nurse Communication          Time: 4600-2984 OT Time Calculation (min): 15 min  Charges: OT General Charges $OT Visit: 1 Visit OT Treatments $Self Care/Home Management : 8-22 mins  09/05/2020  Rich, OTR/L  Acute Rehabilitation Services  Office:  [phone]    Metta Clines 09/05/2020, 10:00 AM

## 2020-09-05 NOTE — TOC Transition Note (Signed)
Transition of Care [facility]) - CM/SW Discharge Note   Patient Details  Name: Julia Knight MRN: [id number] Date of Birth: [birth year]  [facility] CM/SW Contact:  Kermit Balo, RN Phone Number: 09/05/2020, 10:02 AM   Clinical Narrative:    Patient discharging home with self care. No f/u per PT/OT and DME has been delivered to the patient.  Pt has supervision at home and transportation to home.   Final next level of care: Home/Self Care Barriers to Discharge: Inadequate or no insurance,Barriers Unresolved (comment)   Patient Goals and CMS Choice        Discharge Placement                       Discharge Plan and Services                DME Arranged: 3-N-1,Walker rolling DME Agency: [employer] Date DME Agency Contacted: 09/04/20 Time DME Agency Contacted: 1153 Representative spoke with at DME Agency: Nurse to provide pt with DME from [facility] prior to discharge            Social Determinants of Health (SDOH) Interventions     Readmission Risk Interventions No flowsheet data found.

## 2020-09-05 NOTE — Progress Notes (Signed)
Pt was given her AVS discharge summary and went over with her and her daughter. IV was removed with catheter intact. Pt and daughter had no further questions. Equipment (DME) was taken home by daughter yesterday.

## 2020-09-05 NOTE — Progress Notes (Signed)
   Subjective: 4 Days Post-Op Procedure(s) (LRB): CENTRAL LAMINECTOMY LUMBAR ONE-TWO, LUMBAR TWO-THREE, LUMBAR THREE-FOUR AND LUMBAR FOUR-FIVE, TRANSFORAMINAL LUMBAR INTERBODY FUSION LEFT LUMBAR THREE-FOUR AND LUMBAR FOUR-FIVE WITH PEDICLE SCREWS, RODS, CAGES, LOCAL AND ALLOGRAFT BONE GRAFT, VIVIGEN (N/A) Patient reports pain as moderate and severe left leg only.   Objective: Vital signs in last 24 hours: Temp:  [97.7 F (36.5 C)-100.4 F (38 C)] 98.6 F (37 C) (04/30 0832) Pulse Rate:  [76-95] 89 (04/30 0832) Resp:  [14-20] 14 (04/30 0431) BP: (118-135)/(56-64) 135/56 (04/30 0832) SpO2:  [96 %-98 %] 97 % (04/30 0832)  Intake/Output from previous day: No intake/output data recorded. Intake/Output this shift: No intake/output data recorded.  Recent Labs    09/02/20 1038 09/03/20 0713 09/04/20 1005  HGB 7.3* 7.2* 7.2*   Recent Labs    09/03/20 0713 09/04/20 1005  WBC 15.8* 13.2*  RBC 2.50* 2.48*  HCT 22.8* 23.0*  PLT 130* 165   Recent Labs    09/03/20 0713  NA 139  K 4.3  CL 109  CO2 25  BUN 10  CREATININE 0.83  GLUCOSE 184*  CALCIUM 8.9   No results for input(s): LABPT, INR in the last 72 hours.  Neurologically intact  Ankle DF, PF strong right and left No results found.  Assessment/Plan: 4 Days Post-Op Procedure(s) (LRB): CENTRAL LAMINECTOMY LUMBAR ONE-TWO, LUMBAR TWO-THREE, LUMBAR THREE-FOUR AND LUMBAR FOUR-FIVE, TRANSFORAMINAL LUMBAR INTERBODY FUSION LEFT LUMBAR THREE-FOUR AND LUMBAR FOUR-FIVE WITH PEDICLE SCREWS, RODS, CAGES, LOCAL AND ALLOGRAFT BONE GRAFT, VIVIGEN (N/A) Plan: discharge home. Office 2 wks Dr.Nitka   Eldred Manges 09/05/2020, 9:11 AM

## 2020-09-05 NOTE — Plan of Care (Signed)
  Problem: Education: Goal: Knowledge of General Education information will improve Description: Including pain rating scale, medication(s)/side effects and non-pharmacologic comfort measures Outcome: Adequate for Discharge   Problem: Health Behavior/Discharge Planning: Goal: Ability to manage health-related needs will improve Outcome: Adequate for Discharge   Problem: Clinical Measurements: Goal: Ability to maintain clinical measurements within normal limits will improve Outcome: Adequate for Discharge Goal: Will remain free from infection Outcome: Adequate for Discharge Goal: Diagnostic test results will improve Outcome: Adequate for Discharge Goal: Respiratory complications will improve Outcome: Adequate for Discharge Goal: Cardiovascular complication will be avoided Outcome: Adequate for Discharge   Problem: Activity: Goal: Risk for activity intolerance will decrease Outcome: Adequate for Discharge   Problem: Nutrition: Goal: Adequate nutrition will be maintained Outcome: Adequate for Discharge   Problem: Coping: Goal: Level of anxiety will decrease Outcome: Adequate for Discharge   Problem: Elimination: Goal: Will not experience complications related to bowel motility Outcome: Adequate for Discharge Goal: Will not experience complications related to urinary retention Outcome: Adequate for Discharge   Problem: Pain Managment: Goal: General experience of comfort will improve Outcome: Adequate for Discharge   Problem: Safety: Goal: Ability to remain free from injury will improve Outcome: Adequate for Discharge   Problem: Skin Integrity: Goal: Risk for impaired skin integrity will decrease Outcome: Adequate for Discharge   Problem: Acute Rehab PT Goals(only PT should resolve) Goal: Pt will Roll Supine to Side Outcome: Adequate for Discharge Goal: Pt Will Go Supine/Side To Sit Outcome: Adequate for Discharge Goal: Pt Will Go Sit To Supine/Side Outcome:  Adequate for Discharge Goal: Patient Will Transfer Sit To/From Stand Outcome: Adequate for Discharge Goal: Pt Will Transfer Bed To Chair/Chair To Bed Outcome: Adequate for Discharge Goal: Pt Will Ambulate Outcome: Adequate for Discharge Goal: Pt Will Go Up/Down Stairs Outcome: Adequate for Discharge Goal: Pt Will Verbalize and Adhere to Precautions While Description: PT Will Verbalize and Adhere to Precautions While Performing Mobility Outcome: Adequate for Discharge   Problem: Acute Rehab OT Goals (only OT should resolve) Goal: Pt. Will Perform Lower Body Bathing Outcome: Adequate for Discharge Goal: Pt. Will Perform Lower Body Dressing Outcome: Adequate for Discharge Goal: Pt. Will Transfer To Toilet Outcome: Adequate for Discharge Goal: Pt. Will Perform Toileting-Clothing Manipulation Outcome: Adequate for Discharge

## 2020-09-09 NOTE — Progress Notes (Signed)
Established Patient Office Visit  Subjective:  Patient ID: Julia Knight, female    DOB: [birth year]  Age: 70 y.o. MRN: [id number]  CC:  Chief Complaint  Patient presents with  . Follow-up    HPI Julia Knight is a 70 year old female who presents today for follow-up from recent lumbar fusion surgery performed on 11/01/2020. She is with a formal interpreter, Lily, today. Chart review shows anemia due to blood loss during surgery with replacement 2unit transfusion.  Chart review shows some swelling of the tongue post operatively thought to be due to vancomycin instilled in incision.  She has follow-up appointment with Dr. Otelia Sergeant scheduled for 09/18/2020.  Today she tells me that she is doing well with the exception of breakthrough pain, currently at 7/10. She was prescribed Norco at discharge and she endorses taking 2 tablets every 6 hours for pain. She has ran out of pain medication and would like to see if she can have more. She states that she has not been taking any additional Tylenol or Ibuprofen. She has not been using Ice for pain. She is moving around, but when her pain is intense it limits her ability to walk and keep moving. She does tell me her son-in-law contacted the surgeon office to try to get a refill on the pain medication, but they were told he is out of the office. She denies bleeding or drainage from the surgery site. She is wearing a brace.   She tells me that her tongue is still swollen a small amount and is very sore. She endorses difficulty eating due to the soreness of her tongue. She does not have any shortness of breath or difficulty swallowing.   She denies any additional blood loss.   Past Medical History:  Diagnosis Date  . Allergy   . Anxiety   . Arthritis    back and hands  . COVID-19 11/16/2018  . Depression   . Diabetes mellitus without complication (HCC)    type 2  . Dizziness 08/16/2017  . Gastroenteritis, infectious,  presumed 08/16/2017  . GERD (gastroesophageal reflux disease)   . Hyperlipemia   . Hypertension   . Neuromuscular disorder [facility])     Past Surgical History:  Procedure Laterality Date  . APPENDECTOMY    . CHOLECYSTECTOMY    . TONSILLECTOMY     removed as an adult    History reviewed. No pertinent family history.  Social History   Socioeconomic History  . Marital status: Widowed    Spouse name: Not on file  . Number of children: Not on file  . Years of education: Not on file  . Highest education level: Not on file  Occupational History  . Not on file  Tobacco Use  . Smoking status: Former Smoker    Types: Cigarettes  . Smokeless tobacco: Never Used  Vaping Use  . Vaping Use: Never used  Substance and Sexual Activity  . Alcohol use: Never  . Drug use: Never  . Sexual activity: Not Currently  Other Topics Concern  . Not on file  Social History Narrative   ** Merged History Encounter **       Social Determinants of Health   Financial Resource Strain: Not on file  Food Insecurity: Not on file  Transportation Needs: Not on file  Physical Activity: Not on file  Stress: Not on file  Social Connections: Not on file  Intimate Partner Violence: Not on file    Outpatient Medications Prior  to Visit  Medication Sig Dispense Refill  . atorvastatin (LIPITOR) 40 MG tablet TAKE 1 TABLET (40MG ) BY MOUTH EVERY NIGHT AT BEDTIME. (Patient taking differently: Take 40 mg by mouth at bedtime.) 30 tablet 11  . calcium carbonate (OSCAL) 1500 (600 Ca) MG TABS tablet Take 600 mg of elemental calcium by mouth in the morning.    . docusate sodium (COLACE) 100 MG capsule Take 1 capsule (100 mg total) by mouth 2 (two) times daily. 60 capsule 0  . ferrous gluconate (FERGON) 324 MG tablet Take 1 tablet (324 mg total) by mouth 2 (two) times daily with a meal. 40 tablet 1  . gabapentin (NEURONTIN) 300 MG capsule Take 1 capsule (300 mg total) by mouth 2 (two) times daily. 40 capsule 1  .  metFORMIN (GLUCOPHAGE) 1000 MG tablet TAKE 1 TABLET (1000MG ) BY MOUTH EVERY MORNING AND 1 TABLET (1000MG ) BY MOUTH EVERY EVENING WITH A MEAL. 60 tablet 11  . methocarbamol (ROBAXIN) 500 MG tablet Take 1 tablet (500 mg total) by mouth every 8 (eight) hours as needed for muscle spasms. 40 tablet 1  . Multiple Vitamin (MULTIVITAMIN WITH MINERALS) TABS tablet Take 1 tablet by mouth in the morning.    Marland Kitchen. omega-3 acid ethyl esters (LOVAZA) 1 g capsule Take 1 g by mouth daily.    . pantoprazole (PROTONIX) 40 MG tablet TAKE 1 TABLET BY MOUTH EVERY MORNING AND 1 TABLET BY MOUTH EVERY EVENING FOR 7 DAYS. THEN TAKE ONLY 1 TABLET BY MOUTH EVERY EVENING (Patient taking differently: Take 40 mg by mouth daily.) 37 tablet 0  . HYDROcodone-acetaminophen (NORCO) 7.5-325 MG tablet Take 1-2 tablets by mouth every 6 (six) hours as needed for moderate pain ((score 4 to 6)). 40 tablet 0   No facility-administered medications prior to visit.    No Known Allergies  ROS Review of Systems  Constitutional: Negative for chills and fever.  HENT: Positive for mouth sores. Negative for facial swelling, sore throat, trouble swallowing and voice change.   Respiratory: Negative for cough, chest tightness and shortness of breath.   Cardiovascular: Negative for chest pain, palpitations and leg swelling.  Gastrointestinal: Negative for blood in stool, nausea and vomiting.  Genitourinary: Negative for hematuria.  Musculoskeletal: Positive for back pain and gait problem.  Neurological: Positive for weakness. Negative for dizziness, syncope, light-headedness and headaches.  Psychiatric/Behavioral: Negative for sleep disturbance.      Objective:    Physical Exam Vitals and nursing note reviewed.  Constitutional:      General: She is not in acute distress.    Appearance: Normal appearance.  HENT:     Head: Normocephalic and atraumatic.     Mouth/Throat:     Lips: Pink.     Mouth: Mucous membranes are moist. Mucous  membranes are pale.     Tongue: Lesions present. Tongue does not deviate from midline.     Palate: No lesions.     Pharynx: Oropharynx is clear. Uvula midline. No pharyngeal swelling, oropharyngeal exudate or posterior oropharyngeal erythema.     Tonsils: No tonsillar exudate.     Comments: Mild swelling noted to tongue with pale coloration and ulcerations present on the tip and sides of the tongue. No interference with respirations. No signs of respiratory compromise or distress.  Eyes:     Extraocular Movements: Extraocular movements intact.     Conjunctiva/sclera: Conjunctivae normal.     Pupils: Pupils are equal, round, and reactive to light.  Neck:     Vascular: No carotid  bruit.  Cardiovascular:     Rate and Rhythm: Normal rate and regular rhythm.     Pulses: Normal pulses.     Heart sounds: Normal heart sounds.  Pulmonary:     Effort: Pulmonary effort is normal.     Breath sounds: Normal breath sounds.  Abdominal:     General: Abdomen is flat. Bowel sounds are normal. There is no distension.     Palpations: Abdomen is soft.     Tenderness: There is no abdominal tenderness.  Musculoskeletal:     Cervical back: Normal range of motion.     Right lower leg: No edema.     Left lower leg: No edema.     Comments: Walking with a walker- back brace in place. Did not view surgical site. Slow but steady movement with grimacing while walking, sitting, and standing.   Skin:    General: Skin is warm and dry.     Capillary Refill: Capillary refill takes less than 2 seconds.     Coloration: Skin is not pale.  Neurological:     General: No focal deficit present.     Mental Status: She is alert and oriented to person, place, and time.     Motor: Weakness present.     Gait: Gait abnormal.  Psychiatric:        Mood and Affect: Mood normal.        Behavior: Behavior normal.        Thought Content: Thought content normal.        Judgment: Judgment normal.     BP (!) 145/51   Pulse 88    Ht 5\' 1"  (1.549 m)   Wt 172 lb 9.6 oz (78.3 kg)   SpO2 98%   BMI 32.61 kg/m  Wt Readings from Last 3 Encounters:  09/10/20 172 lb 9.6 oz (78.3 kg)  09/01/20 173 lb 3.2 oz (78.6 kg)  08/28/20 173 lb 3.2 oz (78.6 kg)     Health Maintenance Due  Topic Date Due  . Hepatitis C Screening  Never done  . FOOT EXAM  Never done  . OPHTHALMOLOGY EXAM  Never done  . URINE MICROALBUMIN  Never done  . COLONOSCOPY (Pts 45-65yrs Insurance coverage will need to be confirmed)  Never done  . MAMMOGRAM  Never done  . DEXA SCAN  Never done  . PNA vac Low Risk Adult (1 of 2 - PCV13) Never done  . COVID-19 Vaccine (3 - Booster for Pfizer series) 01/27/2020    There are no preventive care reminders to display for this patient.  No results found for: TSH Lab Results  Component Value Date   WBC 10.3 09/10/2020   HGB 7.5 (L) 09/10/2020   HCT 23.8 (L) 09/10/2020   MCV 90.5 09/10/2020   PLT 452 (H) 09/10/2020   Lab Results  Component Value Date   NA 139 09/03/2020   K 4.3 09/03/2020   CO2 25 09/03/2020   GLUCOSE 184 (H) 09/03/2020   BUN 10 09/03/2020   CREATININE 0.83 09/03/2020   BILITOT 0.5 08/28/2020   ALKPHOS 124 08/28/2020   AST 13 (L) 08/28/2020   ALT 16 08/28/2020   PROT 7.7 08/28/2020   ALBUMIN 4.3 08/28/2020   CALCIUM 8.9 09/03/2020   ANIONGAP 5 09/03/2020   Lab Results  Component Value Date   CHOL 204 (H) 07/30/2020   Lab Results  Component Value Date   HDL 47 07/30/2020   Lab Results  Component Value Date   LDLCALC  114 (H) 07/30/2020   Lab Results  Component Value Date   TRIG 214 (H) 07/30/2020   Lab Results  Component Value Date   CHOLHDL 4.3 07/30/2020   Lab Results  Component Value Date   HGBA1C 8.2 (H) 07/24/2020      Assessment & Plan:   Problem List Items Addressed This Visit      Cardiovascular and Mediastinum   Essential hypertension    Blood pressure elevated at 145/51 today. Patient has not been taking BP medication since hospital  discharge under reported recommendations from physician at discharge. She is in considerable amount of pain today, which is likely contributing the elevation.  Recommend that she restart BP medication next week and we will plan to follow-up for further evaluation in 1 month.         Digestive   Glossitis    Glossitis of unknown etiology noted after recent surgery- thought to be a possible reaction from vancomycin. Swelling is minimal, however, there are ulcerations noted with loss of piliform papillae near the tip of the tongue. Coloration is pale with a small amount of white film noted on the  Left lateral side of the tongue, consistent with possible yeast involvement.  Will send magic mouthwash with lidocaine to the pharmacy for QID use for treatment of inflammation and pain.  Solution to include: Viscous lidocaine 2% 150 mL, Benadryl (12.5mg /74mL) 20 mL, hydrocortisone 100 mg, nystatin 20 mL. Patient to follow-up if symptoms worsen or do not improve.        Relevant Medications   magic mouthwash w/lidocaine SOLN     Other   Status post lumbar spinal fusion    Appears to be doing fairly well post-op. She is ambulating with assistance of a walker and is moving slow, but steady. Recommend that she continue to move around to help work out her pain and prevent soreness. Defer additional activities to surgeon for further evaluation.  Her pain is not well controlled at this time. It does appear that she has been taking the maximum dose of norco on a scheduled basis since her discharge. Discussed the importance of limiting pain medication and utilizing ice. Given that the surgeon is unavailable, I will provide an additional 5 day course of Norco 5-325 to be used no more often than every 6 hours. Recommend 1 acetaminophen first and Ice, then utilize pain medication if this is not effective. She also has robaxin and gabapentin that she has been using.  She follows up with the surgeon next Wednesday.         Relevant Medications   HYDROcodone-acetaminophen (NORCO/VICODIN) 5-325 MG tablet   Acute blood loss anemia - Primary    Anemia in surgery with 2 units during hospital admission. Will recheck CBC today.  No signs of worsening anemia, no acute blood loss reported since discharge.  Will determine if further intervention is needed once results received.       Relevant Orders   CBC with Differential/Platelet (Completed)      Meds ordered this encounter  Medications  . HYDROcodone-acetaminophen (NORCO/VICODIN) 5-325 MG tablet    Sig: Take 1 tablet by mouth every 6 (six) hours as needed for up to 5 days for severe pain. Take only if pain score is 7 or greater and not improved with Robaxin.    Dispense:  20 tablet    Refill:  0  . magic mouthwash w/lidocaine SOLN    Sig: Take 15 mLs by mouth 4 (four) times daily  as needed for mouth pain. Swish, gargle and spit.    Dispense:  500 mL    Refill:  1    Viscous lidocaine 2% 150 mL, Benadryl (12.5mg /76mL) 20 mL, hydrocortisone 100 mg, nystatin 20 mL.    Follow-up: Return if symptoms worsen or fail to improve, for Follow-up already scheduled- Needs lab appt today.    Tollie Eth, NP

## 2020-09-09 NOTE — Patient Instructions (Addendum)
Recomendaciones: (Recommendations)  Health and safety inspector tus niveles en sangre hoy. Perdiste sangre en la ciruga y quiero asegurarme de que vuelva a ser normal. (I want to check your blood levels today. You lost blood in surgery and I want to make sure this is normal again.)   Acude a tu cita con la Dra. Nitka. (Keep your appointment with Dr. Otelia Sergeant).  I sent the pain medication to the [facility] at [facility]- take this only for severe pain. Try to take as little as possible. You can take 1 tylenol in between this to help with pain.  Continue to move around as directed and avoid prolonged periods of sitting and immobility.  Use ice packs wrapped in a towel on the area of pain for 20 minutes at a time up as often as needed.   [place]man[place]ecil medicine (25th ed., pp. [phone]). [place], [place]: Elsevier.">  Anemia Anemia  La anemia es una afeccin en la que no hay una cantidad suficiente de glbulos rojos o Education officer, environmental. La hemoglobina es la sustancia de los glbulos rojos que transporta el oxgeno. Cuando no hay suficientes glbulos rojos o hemoglobina (est anmico), su cuerpo no puede recibir el oxgeno suficiente, y es posible que sus rganos no funcionen correctamente. Como [place], es posible que se sienta muy cansado o sufra otros problemas. Cules son las causas? Las causas ms frecuentes de anemia son:  Sharlyne Pacas. La anemia puede ser causada por un sangrado excesivo dentro o fuera del cuerpo, incluido sangrado de los intestinos o a causa de perodos menstruales abundantes en las mujeres.  Nutricin deficiente.  Enfermedad heptica, tiroidea o renal (crnicas).  Trastornos de la mdula sea, problemas en el bazo y trastornos de Risk manager.  Cncer y tratamientos para Management consultant.  VIH (virus de inmunodeficiencia [place]) y SIDA (sndrome de inmunodeficiencia adquirida).  Infecciones, medicamentos y enfermedades autoinmunes que [employer] glbulos  rojos. Cules son los signos o sntomas? Los sntomas de esta afeccin incluyen:  Debilidad leve.  Mareos.  Dolor de [place] o dificultad para concentrarse y dormir.  Latidos cardacos irregulares o ms rpidos que lo normal (palpitaciones).  Falta de aire, especialmente con el ejercicio.  Piel, labios y uas plidos, o manos y pies fros.  Indigestin y nuseas. Los sntomas pueden ocurrir repentinamente o Audiological scientist. Si la anemia es leve, es posible que no tenga sntomas. Cmo se diagnostica? Esta afeccin se diagnostica en funcin de anlisis de sangre, los antecedentes mdicos y un examen fsico. En algunos casos, se puede necesitar una prueba en la que se extraen clulas del tejido blando que est dentro de un hueso y se las observa con un microscopio (biopsia de mdula sea). Adems, el mdico puede controlar si hay sangre en sus heces (materia fecal) y Education officer, environmental anlisis adicionales para Engineer, manufacturing la causa del sangrado. Otras pruebas que pueden realizarle son las siguientes:  Pruebas de diagnstico por imgenes, como una resonancia magntica (RM) o una exploracin por tomografa computarizada (TC).  Un procedimiento para examinar el interior del esfago y Investment banker, corporate (endoscopa).  Un procedimiento para examinar el interior del colon y el recto (colonoscopa). Cmo se trata? El tratamiento de esta afeccin depende de la causa. Si contina perdiendo [employer], es posible que necesite recibir tratamiento en un hospital. El tratamiento puede incluir:  Tomar suplementos de hierro, vitamina B12 o cido flico.  Tomar un medicamento para las hormonas (eritropoyetina) que puede ayudar a Radio producer de glbulos rojos.  Recibir una transfusin de  sangre. Esta ser necesaria si pierde [employer].  Realizar cambios en la dieta.  Someterse a [place] para Public house manager. Siga estas instrucciones en su casa:  Use los medicamentos de venta libre y  los recetados solamente como se lo haya indicado el mdico.  Tome los suplementos solamente como se lo haya indicado el mdico.  Siga las instrucciones del mdico en lo que respecta a la dieta.  Concurra a todas las visitas de [address] se lo haya indicado el mdico. Esto es importante. Comunquese con un mdico si:  Tiene nuevos sangrados en cualquier parte del cuerpo. Solicite ayuda de inmediato si:  Se siente muy dbil.  Le falta el aire.  Siente dolor en la espalda, el abdomen o el pecho.  Se siente mareado o sufre un desmayo.  Tiene dificultad para concentrarse.  Sus heces tienen [place], son [place] o son alquitranadas.  Vomita repetidamente o vomita sangre. Estos sntomas pueden representar un problema grave que constituye Radio broadcast assistant. No espere a ver si los sntomas desaparecen. Solicite atencin mdica de inmediato. Comunquese con el servicio de emergencias de su localidad (911 en los [place]). No conduzca por sus propios medios [employer] hospital. Resumen  La anemia es una afeccin en la que no hay suficientes glbulos rojos o la cantidad suficiente de la sustancia de los glbulos rojos que transporta el oxgeno (hemoglobina).  Los sntomas pueden ocurrir repentinamente o Audiological scientist.  Si la anemia es leve, es posible que no tenga sntomas.  Esta afeccin se diagnostica mediante anlisis de sangre, los antecedentes mdicos y un examen fsico. Pueden ser necesarios otros estudios.  El tratamiento de esta afeccin depende de la causa de la anemia. Esta informacin no tiene Theme park manager el consejo del mdico. Asegrese de hacerle al mdico cualquier pregunta que tenga. Document Revised: 05/30/2019 Document Reviewed: 05/30/2019 Elsevier Patient Education  2021 [employer].

## 2020-09-10 ENCOUNTER — Other Ambulatory Visit: Payer: Self-pay

## 2020-09-10 ENCOUNTER — Encounter (HOSPITAL_BASED_OUTPATIENT_CLINIC_OR_DEPARTMENT_OTHER): Payer: Self-pay | Admitting: Nurse Practitioner

## 2020-09-10 ENCOUNTER — Ambulatory Visit (INDEPENDENT_AMBULATORY_CARE_PROVIDER_SITE_OTHER): Payer: Self-pay | Admitting: Nurse Practitioner

## 2020-09-10 ENCOUNTER — Other Ambulatory Visit (HOSPITAL_BASED_OUTPATIENT_CLINIC_OR_DEPARTMENT_OTHER)
Admission: RE | Admit: 2020-09-10 | Discharge: 2020-09-10 | Disposition: A | Discharge status: Home / Self Care | Payer: Self-pay | Source: Ambulatory Visit | Attending: Nurse Practitioner | Admitting: Nurse Practitioner

## 2020-09-10 VITALS — BP 145/51 | HR 88 | Ht 61.0 in | Wt 172.6 lb

## 2020-09-10 DIAGNOSIS — I10 Essential (primary) hypertension: Secondary | ICD-10-CM

## 2020-09-10 DIAGNOSIS — D62 Acute posthemorrhagic anemia: Secondary | ICD-10-CM

## 2020-09-10 DIAGNOSIS — K140 Glossitis: Secondary | ICD-10-CM

## 2020-09-10 DIAGNOSIS — Z981 Arthrodesis status: Secondary | ICD-10-CM

## 2020-09-10 HISTORY — DX: Glossitis: K14.0

## 2020-09-10 HISTORY — DX: Acute posthemorrhagic anemia: D62

## 2020-09-10 LAB — CBC WITH DIFFERENTIAL/PLATELET
Abs Immature Granulocytes: 0.11 10*3/uL — ABNORMAL HIGH (ref 0.00–0.07)
Basophils Absolute: 0 10*3/uL (ref 0.0–0.1)
Basophils Relative: 0 %
Eosinophils Absolute: 0.3 10*3/uL (ref 0.0–0.5)
Eosinophils Relative: 2 %
HCT: 23.8 % — ABNORMAL LOW (ref 36.0–46.0)
Hemoglobin: 7.5 g/dL — ABNORMAL LOW (ref 12.0–15.0)
Immature Granulocytes: 1 %
Lymphocytes Relative: 22 %
Lymphs Abs: 2.3 10*3/uL (ref 0.7–4.0)
MCH: 28.5 pg (ref 26.0–34.0)
MCHC: 31.5 g/dL (ref 30.0–36.0)
MCV: 90.5 fL (ref 80.0–100.0)
Monocytes Absolute: 1.1 10*3/uL — ABNORMAL HIGH (ref 0.1–1.0)
Monocytes Relative: 11 %
Neutro Abs: 6.5 10*3/uL (ref 1.7–7.7)
Neutrophils Relative %: 64 %
Platelets: 452 10*3/uL — ABNORMAL HIGH (ref 150–400)
RBC: 2.63 MIL/uL — ABNORMAL LOW (ref 3.87–5.11)
RDW: 12.6 % (ref 11.5–15.5)
WBC: 10.3 10*3/uL (ref 4.0–10.5)
nRBC: 0 % (ref 0.0–0.2)

## 2020-09-10 MED ORDER — MAGIC MOUTHWASH W/LIDOCAINE: 15.0000 mL | Freq: Four times a day (QID) | ORAL | 1 refills | Status: DC | PRN

## 2020-09-10 MED ORDER — HYDROCODONE-ACETAMINOPHEN 5-325 MG PO TABS: 1.0000 | ORAL_TABLET | Freq: Four times a day (QID) | ORAL | 0 refills | Status: AC | PRN

## 2020-09-10 NOTE — Assessment & Plan Note (Signed)
Anemia in surgery with 2 units during hospital admission. Will recheck CBC today.  No signs of worsening anemia, no acute blood loss reported since discharge.  Will determine if further intervention is needed once results received.

## 2020-09-10 NOTE — Assessment & Plan Note (Signed)
Blood pressure elevated at 145/51 today. Patient has not been taking BP medication since hospital discharge under reported recommendations from physician at discharge. She is in considerable amount of pain today, which is likely contributing the elevation.  Recommend that she restart BP medication next week and we will plan to follow-up for further evaluation in 1 month.

## 2020-09-10 NOTE — Addendum Note (Signed)
Addended by: Tyan Dy, Huntley Dec E on: 09/10/2020 01:20 PM   Modules accepted: Orders

## 2020-09-10 NOTE — Assessment & Plan Note (Signed)
Glossitis of unknown etiology noted after recent surgery- thought to be a possible reaction from vancomycin. Swelling is minimal, however, there are ulcerations noted with loss of piliform papillae near the tip of the tongue. Coloration is pale with a small amount of white film noted on the  Left lateral side of the tongue, consistent with possible yeast involvement.  Will send magic mouthwash with lidocaine to the pharmacy for QID use for treatment of inflammation and pain.  Solution to include: Viscous lidocaine 2% 150 mL, Benadryl (12.5mg /85mL) 20 mL, hydrocortisone 100 mg, nystatin 20 mL. Patient to follow-up if symptoms worsen or do not improve.

## 2020-09-10 NOTE — Assessment & Plan Note (Signed)
Appears to be doing fairly well post-op. She is ambulating with assistance of a walker and is moving slow, but steady. Recommend that she continue to move around to help work out her pain and prevent soreness. Defer additional activities to surgeon for further evaluation.  Her pain is not well controlled at this time. It does appear that she has been taking the maximum dose of norco on a scheduled basis since her discharge. Discussed the importance of limiting pain medication and utilizing ice. Given that the surgeon is unavailable, I will provide an additional 5 day course of Norco 5-325 to be used no more often than every 6 hours. Recommend 1 acetaminophen first and Ice, then utilize pain medication if this is not effective. She also has robaxin and gabapentin that she has been using.  She follows up with the surgeon next Wednesday.

## 2020-09-11 NOTE — Progress Notes (Signed)
Hemoglobin improved, but still low.  Platelet counts elevated.  WBC have normalized overall.   Plan to recheck in 1 week with labs only- patient may come here or we can contact surgeon office to request they redraw at her f/u visit.

## 2020-09-15 ENCOUNTER — Other Ambulatory Visit (HOSPITAL_BASED_OUTPATIENT_CLINIC_OR_DEPARTMENT_OTHER): Payer: Self-pay | Admitting: Nurse Practitioner

## 2020-09-15 ENCOUNTER — Telehealth (HOSPITAL_BASED_OUTPATIENT_CLINIC_OR_DEPARTMENT_OTHER): Payer: Self-pay

## 2020-09-15 DIAGNOSIS — D62 Acute posthemorrhagic anemia: Secondary | ICD-10-CM

## 2020-09-15 NOTE — Telephone Encounter (Signed)
Results released by Shawna Clamp, AGNP.  Called patient to over over lab results and spoke with her daughter Darrick Meigs.  She is aware and Nalia will have her labs drawn at the surgeon's office when she goes for follow up.  Instructed Guadalupe to contact the office with any questions or concerns.

## 2020-09-15 NOTE — Discharge Summary (Signed)
Patient ID: Julia Knight MRN: [id number] DOB/AGE: April 05, 1951 70 y.o.  Admit date: 09/01/2020 Discharge date: 09/05/2020 Admission Diagnoses:  Principal Problem:   Spinal stenosis, lumbar region with neurogenic claudication Active Problems:   Spondylolisthesis at L3-L4 level   Spondylolisthesis at L4-L5 level   Status post lumbar spinal fusion   Postoperative anemia due to acute blood loss   Discharge Diagnoses:  Principal Problem:   Spinal stenosis, lumbar region with neurogenic claudication Active Problems:   Spondylolisthesis at L3-L4 level   Spondylolisthesis at L4-L5 level   Status post lumbar spinal fusion   Postoperative anemia due to acute blood loss  status post Procedure(s): CENTRAL LAMINECTOMY LUMBAR ONE-TWO, LUMBAR TWO-THREE, LUMBAR THREE-FOUR AND LUMBAR FOUR-FIVE, TRANSFORAMINAL LUMBAR INTERBODY FUSION LEFT LUMBAR THREE-FOUR AND LUMBAR FOUR-FIVE WITH PEDICLE SCREWS, RODS, CAGES, LOCAL AND ALLOGRAFT BONE GRAFT, VIVIGEN  Past Medical History:  Diagnosis Date  . Allergy   . Anxiety   . Arthritis    back and hands  . COVID-19 11/16/2018  . Depression   . Diabetes mellitus without complication (HCC)    type 2  . Dizziness 08/16/2017  . Gastroenteritis, infectious, presumed 08/16/2017  . GERD (gastroesophageal reflux disease)   . Hyperlipemia   . Hypertension   . Neuromuscular disorder (HCC)     Surgeries: Procedure(s): CENTRAL LAMINECTOMY LUMBAR ONE-TWO, LUMBAR TWO-THREE, LUMBAR THREE-FOUR AND LUMBAR FOUR-FIVE, TRANSFORAMINAL LUMBAR INTERBODY FUSION LEFT LUMBAR THREE-FOUR AND LUMBAR FOUR-FIVE WITH PEDICLE SCREWS, RODS, CAGES, LOCAL AND ALLOGRAFT BONE GRAFT, VIVIGEN on 09/01/2020   Consultants:   Discharged Condition: Improved  Hospital Course: Julia Knight is an 70 y.o. female who was admitted 09/01/2020 for operative treatment of Spinal stenosis, lumbar region with neurogenic claudication. Patient failed conservative treatments  (please see the history and physical for the specifics) and had severe unremitting pain that affects sleep, daily activities and work/hobbies. After pre-op clearance, the patient was taken to the operating room on 09/01/2020 and underwent  Procedure(s): CENTRAL LAMINECTOMY LUMBAR ONE-TWO, LUMBAR TWO-THREE, LUMBAR THREE-FOUR AND LUMBAR FOUR-FIVE, TRANSFORAMINAL LUMBAR INTERBODY FUSION LEFT LUMBAR THREE-FOUR AND LUMBAR FOUR-FIVE WITH PEDICLE SCREWS, RODS, CAGES, LOCAL AND ALLOGRAFT BONE GRAFT, VIVIGEN.    Patient was given perioperative antibiotics:  Anti-infectives (From admission, onward)   Start     Dose/Rate Route Frequency Ordered Stop   09/01/20 2000  ceFAZolin (ANCEF) IVPB 2g/100 mL premix        2 g 200 mL/hr over 30 Minutes Intravenous Every 8 hours 09/01/20 1647 09/02/20 0434   09/01/20 1329  vancomycin (VANCOCIN) powder  Status:  Discontinued          As needed 09/01/20 1330 09/01/20 1519   09/01/20 0630  ceFAZolin (ANCEF) IVPB 2g/100 mL premix        2 g 200 mL/hr over 30 Minutes Intravenous On call to O.R. 09/01/20 0622 09/01/20 1200   09/01/20 0623  ceFAZolin (ANCEF) 2-4 GM/100ML-% IVPB       Note to Pharmacy: Clovis Cao   : cabinet override      09/01/20 0623 09/01/20 0842       Patient was given sequential compression devices and early ambulation to prevent DVT.   Patient benefited maximally from hospital stay and there were no complications. At the time of discharge, the patient was urinating/moving their bowels without difficulty, tolerating a regular diet, pain is controlled with oral pain medications and they have been cleared by PT/OT.   Recent vital signs: No data found.   Recent laboratory studies: No results for  input(s): WBC, HGB, HCT, PLT, NA, K, CL, CO2, BUN, CREATININE, GLUCOSE, INR, CALCIUM in the last 72 hours.  Invalid input(s): PT, 2   Discharge Medications:   Allergies as of 09/05/2020   No Known Allergies     Medication List    STOP taking  these medications   acetaminophen 500 MG tablet Commonly known as: TYLENOL   ibuprofen 200 MG tablet Commonly known as: ADVIL   lisinopril 20 MG tablet Commonly known as: ZESTRIL     TAKE these medications   atorvastatin 40 MG tablet Commonly known as: LIPITOR TAKE 1 TABLET (40MG ) BY MOUTH EVERY NIGHT AT BEDTIME. What changed:   how much to take  how to take this  when to take this   calcium carbonate 1500 (600 Ca) MG Tabs tablet Commonly known as: OSCAL Take 600 mg of elemental calcium by mouth in the morning.   docusate sodium 100 MG capsule Commonly known as: COLACE Take 1 capsule (100 mg total) by mouth 2 (two) times daily.   ferrous gluconate 324 MG tablet Commonly known as: FERGON Take 1 tablet (324 mg total) by mouth 2 (two) times daily with a meal.   gabapentin 300 MG capsule Commonly known as: NEURONTIN Take 1 capsule (300 mg total) by mouth 2 (two) times daily.   metFORMIN 1000 MG tablet Commonly known as: GLUCOPHAGE TAKE 1 TABLET (1000MG ) BY MOUTH EVERY MORNING AND 1 TABLET (1000MG ) BY MOUTH EVERY EVENING WITH A MEAL.   methocarbamol 500 MG tablet Commonly known as: ROBAXIN Take 1 tablet (500 mg total) by mouth every 8 (eight) hours as needed for muscle spasms.   multivitamin with minerals Tabs tablet Take 1 tablet by mouth in the morning.   omega-3 acid ethyl esters 1 g capsule Commonly known as: LOVAZA Take 1 g by mouth daily.   pantoprazole 40 MG tablet Commonly known as: PROTONIX TAKE 1 TABLET BY MOUTH EVERY MORNING AND 1 TABLET BY MOUTH EVERY EVENING FOR 7 DAYS. THEN TAKE ONLY 1 TABLET BY MOUTH EVERY EVENING What changed:   how much to take  how to take this  when to take this       Diagnostic Studies: DG Chest 2 View  Result Date: 08/31/2020 CLINICAL DATA:  Preop for lumbar spine surgery. EXAM: CHEST - 2 VIEW COMPARISON:  None. FINDINGS: Lungs are clear. No airspace disease or lung consolidation. Sclerosis involving the anterior  left first rib. Heart size is normal. Degenerative changes in the lower thoracic spine and upper lumbar spine. No large pleural effusions. IMPRESSION: No active cardiopulmonary disease. Electronically Signed   By: M.D.   On: 08/31/2020 09:27   DG Lumbar Spine Complete  Result Date: 09/01/2020 CLINICAL DATA:  Elective surgery. EXAM: LUMBAR SPINE - COMPLETE 4+ VIEW; DG C-ARM 1-60 MIN COMPARISON:  None. FINDINGS: Four fluoroscopic spot views of the lumbar spine obtained in the operating room. Posterior rod with intrapedicular screws L3 through L5. Interbody spacer is in place. Fluoroscopy time 1 minutes 38 seconds. Dose 54.12 mGy. IMPRESSION: Intraoperative fluoroscopy during lumbar fusion. Electronically Signed   By: Richarda Overlie M.D.   On: 09/01/2020 15:44   DG C-Arm 1-60 Min  Result Date: 09/01/2020 CLINICAL DATA:  Elective surgery. EXAM: LUMBAR SPINE - COMPLETE 4+ VIEW; DG C-ARM 1-60 MIN COMPARISON:  None. FINDINGS: Four fluoroscopic spot views of the lumbar spine obtained in the operating room. Posterior rod with intrapedicular screws L3 through L5. Interbody spacer is in place. Fluoroscopy time 1  minutes 38 seconds. Dose 54.12 mGy. IMPRESSION: Intraoperative fluoroscopy during lumbar fusion. Electronically Signed   By: Narda RutherfordMelanie  Sanford M.D.   On: 09/01/2020 15:44    Discharge Instructions    Call MD / Call 911   Complete by: As directed    If you experience chest pain or shortness of breath, CALL 911 and be transported to the hospital emergency room.  If you develope a fever above 101 F, pus (white drainage) or increased drainage or redness at the wound, or calf pain, call your surgeon's office.   Constipation Prevention   Complete by: As directed    Drink plenty of fluids.  Prune juice may be helpful.  You may use a stool softener, such as Colace (over the counter) 100 mg twice a day.  Use MiraLax (over the counter) for constipation as needed.   Diet Carb Modified   Complete by:  As directed    Discharge instructions   Complete by: As directed    Call if there is increasing drainage, fever greater than 101.5, severe head aches, and worsening nausea or light sensitivity. If shortness of breath, bloody cough or chest tightness or pain go to an emergency room. No lifting greater than 10 lbs. Avoid bending, stooping and twisting. Use brace when sitting and out of bed even to go to bathroom. Walk in house for first 2 weeks then may start to get out slowly increasing distances up to one mile by 4-6 weeks post op. After 5 days may shower and change dressing following bathing with shower.When bathing remove the brace shower and replace brace before getting out of the shower. If drainage, keep dry dressing and do not bathe the incision, use an moisture impervious dressing. Please call and return for scheduled follow up appointment 2 weeks from the time of surgery. Stop use of high blood pressure medications.   Driving restrictions   Complete by: As directed    No driving for 8 weeks   Increase activity slowly as tolerated   Complete by: As directed    Lifting restrictions   Complete by: As directed    No lifting for 12 weeks   Post-operative opioid taper instructions:   Complete by: As directed    POST-OPERATIVE OPIOID TAPER INSTRUCTIONS: It is important to wean off of your opioid medication as soon as possible. If you do not need pain medication after your surgery it is ok to stop day one. Opioids include: Codeine, Hydrocodone(Norco, Vicodin), Oxycodone(Percocet, oxycontin) and hydromorphone amongst others.  Long term and even short term use of opiods can cause: Increased pain response Dependence Constipation Depression Respiratory depression And more.  Withdrawal symptoms can include Flu like symptoms Nausea, vomiting And more Techniques to manage these symptoms Hydrate well Eat regular healthy meals Stay active Use relaxation techniques(deep breathing,  meditating, yoga) Do Not substitute Alcohol to help with tapering If you have been on opioids for less than two weeks and do not have pain than it is ok to stop all together.  Plan to wean off of opioids This plan should start within one week post op of your joint replacement. Maintain the same interval or time between taking each dose and first decrease the dose.  Cut the total daily intake of opioids by one tablet each day Next start to increase the time between doses. The last dose that should be eliminated is the evening dose.          Follow-up Information    Vira Brownsitka, Collie Kittel  E, MD In 2 weeks.   Specialty: Orthopedic Surgery Why: For wound re-check Contact information: [address] [place] [address] [phone]        Tollie Eth, NP. Schedule an appointment as soon as possible for a visit in 1 week(s).   Specialty: Nurse Practitioner Why: follow up of anemia, tongue swelling Contact information: [address] [place] [address] [phone]               Discharge Plan:  discharge to home  Disposition:     Signed: Zonia Kief  09/15/2020, 9:47 AM

## 2020-09-15 NOTE — Telephone Encounter (Signed)
-----   Message from Tollie Eth, NP sent at 09/11/2020  6:38 PM EDT ----- Hemoglobin improved, but still low.  Platelet counts elevated.  WBC have normalized overall.   Plan to recheck in 1 week with labs only- patient may come here or we can contact surgeon office to request they redraw at her f/u visit.

## 2020-09-16 ENCOUNTER — Ambulatory Visit: Payer: Self-pay

## 2020-09-16 ENCOUNTER — Ambulatory Visit (INDEPENDENT_AMBULATORY_CARE_PROVIDER_SITE_OTHER): Payer: Self-pay | Admitting: Surgery

## 2020-09-16 ENCOUNTER — Encounter: Payer: Self-pay | Admitting: Surgery

## 2020-09-16 DIAGNOSIS — Z981 Arthrodesis status: Secondary | ICD-10-CM

## 2020-09-16 DIAGNOSIS — Z9889 Other specified postprocedural states: Secondary | ICD-10-CM

## 2020-09-16 DIAGNOSIS — M7062 Trochanteric bursitis, left hip: Secondary | ICD-10-CM

## 2020-09-16 MED ORDER — OXYCODONE-ACETAMINOPHEN 5-325 MG PO TABS: 1.0000 | ORAL_TABLET | Freq: Four times a day (QID) | ORAL | 0 refills | Status: DC | PRN

## 2020-09-16 NOTE — Progress Notes (Signed)
Office Visit Note   Patient: Julia Knight           Date of Birth: 1950-07-15           MRN: [id number] Visit Date: 09/16/2020              Requested by: Tollie Eth, NP [address] [place],  [place] [address] PCP: Tollie Eth, NP   Assessment & Plan: Visit Diagnoses:  1. S/P lumbar fusion   2. S/P lumbar laminectomy   3. Greater trochanteric bursitis, left     Plan: In hopes of giving patient relief of her left lateral hip pain offered injection.  After patient consent left lateral hip was prepped with Betadine and greater trochanter bursa Marcaine/betamethasone injection was performed.  After sitting for a few minutes patient reported excellent relief of her lateral hip pain with anesthetic in place.  She will continue wearing her lumbar brace.  Follow-up with Dr. Otelia Sergeant in 4 weeks for recheck.  All questions answered.  Follow-Up Instructions: Return in about 4 weeks (around 10/14/2020) for with dr Otelia Sergeant for postop check.   Orders:  Orders Placed This Encounter  Procedures  . XR Lumbar Spine 2-3 Views   No orders of the defined types were placed in this encounter.     Procedures: Large Joint Inj: L greater trochanter on 09/16/2020 10:37 AM Indications: pain Details: 22 G 1.5 in needle Medications: 3 mL lidocaine 1 %; 6 mL bupivacaine 0.25 %; 40 mg methylPREDNISolone acetate 40 MG/ML Outcome: tolerated well, no immediate complications  Had great relief of lateral left hip pain with anesthetic in place.  Consent was given by the patient. Patient was prepped and draped in the usual sterile fashion.       Clinical Data: No additional findings.   Subjective: Chief Complaint  Patient presents with  . Lower Back - Routine Post Op    HPI 70 year old Hispanic female who is 2-week status post CENTRAL LAMINECTOMY LUMBAR ONE-TWO, LUMBAR TWO-THREE, LUMBAR THREE-FOUR AND LUMBAR FOUR-FIVE, TRANSFORAMINAL LUMBAR INTERBODY FUSION LEFT LUMBAR  THREE-FOUR AND LUMBAR FOUR-FIVE WITH PEDICLE SCREWS, RODS, CAGES, LOCAL AND ALLOGRAFT BONE GRAFT, VIVIGEN returns.  States that her preop right leg pain is gone.  She is complaining of left lateral hip pain.  This aggravated when she is ambulating and laying on her left side.  No complaints of groin pain.   Objective: Vital Signs: There were no vitals taken for this visit.  Physical Exam Lumbar wound looks good.  Staples removed and Steri-Strips applied.  No drainage or signs of infection.  Patient has marked tenderness over the left hip greater trochanter bursa.  Negative logroll.  Negative straight leg raise. Ortho Exam  Specialty Comments:  No specialty comments available.  Imaging: No results found.   PMFS History: Patient Active Problem List   Diagnosis Date Noted  . Acute blood loss anemia 09/10/2020  . Glossitis 09/10/2020  . Postoperative anemia due to acute blood loss 09/03/2020    Class: Acute  . Spinal stenosis, 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  . Spondylolisthesis at L4-L5 level 09/01/2020    Class: Chronic  . Status post lumbar spinal fusion 09/01/2020  . Hyperlipidemia 07/24/2020  . Encounter for medical examination to establish care 07/24/2020  . BMI 34.0-34.9,adult 05/22/2020  . Spondylolisthesis at L3-L4 level 05/22/2020  . Anxiety 06/07/2018  . GERD (gastroesophageal reflux disease) 06/03/2013  . Left-sided low back pain with sciatica 10/09/2012  . Left leg pain  08/14/2012  . Depression 03/08/2012  . Allergic rhinitis 08/24/2011  . Essential hypertension 08/24/2011  . T2DM (type 2 diabetes mellitus) (HCC) 02/28/2011   Past Medical History:  Diagnosis Date  . Allergy   . Anxiety   . Arthritis    back and hands  . COVID-19 11/16/2018  . Depression   . Diabetes mellitus without complication (HCC)    type 2  . Dizziness 08/16/2017  . Gastroenteritis, infectious, presumed 08/16/2017  . GERD (gastroesophageal reflux disease)   .  Hyperlipemia   . Hypertension   . Neuromuscular disorder (HCC)     History reviewed. No pertinent family history.  Past Surgical History:  Procedure Laterality Date  . APPENDECTOMY    . CHOLECYSTECTOMY    . TONSILLECTOMY     removed as an adult   Social History   Occupational History  . Not on file  Tobacco Use  . Smoking status: Former Smoker    Types: Cigarettes  . Smokeless tobacco: Never Used  Vaping Use  . Vaping Use: Never used  Substance and Sexual Activity  . Alcohol use: Never  . Drug use: Never  . Sexual activity: Not Currently

## 2020-09-18 ENCOUNTER — Other Ambulatory Visit (HOSPITAL_BASED_OUTPATIENT_CLINIC_OR_DEPARTMENT_OTHER)
Admission: RE | Admit: 2020-09-18 | Discharge: 2020-09-18 | Disposition: A | Discharge status: Home / Self Care | Payer: Self-pay | Source: Ambulatory Visit | Attending: Nurse Practitioner | Admitting: Nurse Practitioner

## 2020-09-18 ENCOUNTER — Ambulatory Visit (INDEPENDENT_AMBULATORY_CARE_PROVIDER_SITE_OTHER): Payer: Self-pay | Admitting: Nurse Practitioner

## 2020-09-18 ENCOUNTER — Other Ambulatory Visit: Payer: Self-pay

## 2020-09-18 ENCOUNTER — Encounter (HOSPITAL_BASED_OUTPATIENT_CLINIC_OR_DEPARTMENT_OTHER): Payer: Self-pay | Admitting: Nurse Practitioner

## 2020-09-18 VITALS — BP 138/47 | HR 70 | Ht 61.0 in | Wt 177.0 lb

## 2020-09-18 DIAGNOSIS — K219 Gastro-esophageal reflux disease without esophagitis: Secondary | ICD-10-CM

## 2020-09-18 DIAGNOSIS — D62 Acute posthemorrhagic anemia: Secondary | ICD-10-CM

## 2020-09-18 DIAGNOSIS — K5903 Drug induced constipation: Secondary | ICD-10-CM

## 2020-09-18 DIAGNOSIS — L89152 Pressure ulcer of sacral region, stage 2: Secondary | ICD-10-CM | POA: Insufficient documentation

## 2020-09-18 DIAGNOSIS — T402X5A Adverse effect of other opioids, initial encounter: Secondary | ICD-10-CM

## 2020-09-18 DIAGNOSIS — K140 Glossitis: Secondary | ICD-10-CM

## 2020-09-18 HISTORY — DX: Adverse effect of other opioids, initial encounter: T40.2X5A

## 2020-09-18 HISTORY — DX: Drug induced constipation: K59.03

## 2020-09-18 LAB — CBC WITH DIFFERENTIAL/PLATELET
Abs Immature Granulocytes: 0.05 10*3/uL (ref 0.00–0.07)
Basophils Absolute: 0.1 10*3/uL (ref 0.0–0.1)
Basophils Relative: 1 %
Eosinophils Absolute: 0.2 10*3/uL (ref 0.0–0.5)
Eosinophils Relative: 1 %
HCT: 26.8 % — ABNORMAL LOW (ref 36.0–46.0)
Hemoglobin: 8.1 g/dL — ABNORMAL LOW (ref 12.0–15.0)
Immature Granulocytes: 0 %
Lymphocytes Relative: 24 %
Lymphs Abs: 3.2 10*3/uL (ref 0.7–4.0)
MCH: 28 pg (ref 26.0–34.0)
MCHC: 30.2 g/dL (ref 30.0–36.0)
MCV: 92.7 fL (ref 80.0–100.0)
Monocytes Absolute: 0.9 10*3/uL (ref 0.1–1.0)
Monocytes Relative: 7 %
Neutro Abs: 8.9 10*3/uL — ABNORMAL HIGH (ref 1.7–7.7)
Neutrophils Relative %: 67 %
Platelets: 482 10*3/uL — ABNORMAL HIGH (ref 150–400)
RBC: 2.89 MIL/uL — ABNORMAL LOW (ref 3.87–5.11)
RDW: 14.4 % (ref 11.5–15.5)
WBC: 13.2 10*3/uL — ABNORMAL HIGH (ref 4.0–10.5)
nRBC: 0 % (ref 0.0–0.2)

## 2020-09-18 MED ORDER — PANTOPRAZOLE SODIUM 40 MG PO TBEC: 40.0000 mg | DELAYED_RELEASE_TABLET | Freq: Every day | ORAL | 11 refills | Status: DC

## 2020-09-18 MED ORDER — SILVER SULFADIAZINE 1 % EX CREA: 1.0000 "application " | TOPICAL_CREAM | Freq: Every day | CUTANEOUS | 0 refills | Status: DC

## 2020-09-18 MED ORDER — POLYETHYLENE GLYCOL 3350 17 G PO PACK
17.0000 g | PACK | Freq: Two times a day (BID) | ORAL | 6 refills | Status: DC
Start: 2020-09-18 — End: 2022-01-25

## 2020-09-18 MED ORDER — FERROUS SULFATE 325 (65 FE) MG PO TBEC: DELAYED_RELEASE_TABLET | ORAL | 5 refills | Status: DC

## 2020-09-18 NOTE — Progress Notes (Signed)
Established Patient Office Visit  Subjective:  Patient ID: Julia Knight, female    DOB: July 28, 1950  Age: 70 y.o. MRN: [id number]  CC:  Chief Complaint  Patient presents with  . Follow-up  . Anemia  . Hemorrhoids  . Constipation    HPI Julia Knight presents for follow-up on anemia. Would also like to discuss constipation and pressure wound to sacrum. Recent surgery for spine with acute anemia due to blood loss from surgery. Last labs showed hemoglobin of 7.5  Pain improved to 5/10. Saw orthopedic office on Monday.  Still on pain medications as needed- using less than previously.  Was given injection for trochanteric bursitis Appears orthopedic surgeon provided prescription for ferrous gluconate to be taken twice a day. Patient reports she was unaware of this prescription  Hard, painful, infrequent stooling since surgery.  Taking opiates for pain management and decreased activity with surgery.  No bleeding or hemorrhoids  Pressure wound to sacrum Noticed after hospitalization- not getting worse, but healing slowly Top layer of skin gone Some minimal scabbing No redness, drainage, or odor present. No fevers, chills.    Past Medical History:  Diagnosis Date  . Allergy   . Anxiety   . Arthritis    back and hands  . COVID-19 11/16/2018  . Depression   . Diabetes mellitus without complication (HCC)    type 2  . Dizziness 08/16/2017  . Gastroenteritis, infectious, presumed 08/16/2017  . GERD (gastroesophageal reflux disease)   . Hyperlipemia   . Hypertension   . Neuromuscular disorder [facility])     Past Surgical History:  Procedure Laterality Date  . APPENDECTOMY    . CHOLECYSTECTOMY    . TONSILLECTOMY     removed as an adult    History reviewed. No pertinent family history.  Social History   Socioeconomic History  . Marital status: Widowed    Spouse name: Not on file  . Number of children: Not on file  . Years of education: Not on  file  . Highest education level: Not on file  Occupational History  . Not on file  Tobacco Use  . Smoking status: Former Smoker    Types: Cigarettes  . Smokeless tobacco: Never Used  Vaping Use  . Vaping Use: Never used  Substance and Sexual Activity  . Alcohol use: Never  . Drug use: Never  . Sexual activity: Not Currently  Other Topics Concern  . Not on file  Social History Narrative   ** Merged History Encounter **       Social Determinants of Health   Financial Resource Strain: Not on file  Food Insecurity: Not on file  Transportation Needs: Not on file  Physical Activity: Not on file  Stress: Not on file  Social Connections: Not on file  Intimate Partner Violence: Not on file    Outpatient Medications Prior to Visit  Medication Sig Dispense Refill  . atorvastatin (LIPITOR) 40 MG tablet TAKE 1 TABLET (40MG ) BY MOUTH EVERY NIGHT AT BEDTIME. (Patient taking differently: Take 40 mg by mouth at bedtime.) 30 tablet 11  . calcium carbonate (OSCAL) 1500 (600 Ca) MG TABS tablet Take 600 mg of elemental calcium by mouth in the morning.    . docusate sodium (COLACE) 100 MG capsule Take 1 capsule (100 mg total) by mouth 2 (two) times daily. 60 capsule 0  . ferrous gluconate (FERGON) 324 MG tablet Take 1 tablet (324 mg total) by mouth 2 (two) times daily with a meal. 40  tablet 1  . gabapentin (NEURONTIN) 300 MG capsule Take 1 capsule (300 mg total) by mouth 2 (two) times daily. 40 capsule 1  . magic mouthwash w/lidocaine SOLN Take 15 mLs by mouth 4 (four) times daily as needed for mouth pain. Swish, gargle and spit. 500 mL 1  . metFORMIN (GLUCOPHAGE) 1000 MG tablet TAKE 1 TABLET (1000MG ) BY MOUTH EVERY MORNING AND 1 TABLET (1000MG ) BY MOUTH EVERY EVENING WITH A MEAL. 60 tablet 11  . methocarbamol (ROBAXIN) 500 MG tablet Take 1 tablet (500 mg total) by mouth every 8 (eight) hours as needed for muscle spasms. 40 tablet 1  . Multiple Vitamin (MULTIVITAMIN WITH MINERALS) TABS tablet Take  1 tablet by mouth in the morning.    omega-3 acid ethyl esters (LOVAZA) 1 g capsule Take 1 g by mouth daily.    oxyCODONE-acetaminophen (PERCOCET/ROXICET) 5-325 MG tablet Take 1 tablet by mouth every 6 (six) hours as needed for severe pain. 50 tablet 0  . pantoprazole (PROTONIX) 40 MG tablet TAKE 1 TABLET BY MOUTH EVERY MORNING AND 1 TABLET BY MOUTH EVERY EVENING FOR 7 DAYS. THEN TAKE ONLY 1 TABLET BY MOUTH EVERY EVENING (Patient taking differently: Take 40 mg by mouth daily.) 37 tablet 0   No facility-administered medications prior to visit.    No Known Allergies  ROS Review of Systems All review of systems negative except what is listed in the HPI    Objective:    Physical Exam  BP (!) 138/47   Pulse 70   Ht 5\' 1"  (1.549 m)   Wt 177 lb (80.3 kg)   SpO2 100%   BMI 33.44 kg/m  Wt Readings from Last 3 Encounters:  09/18/20 177 lb (80.3 kg)  09/10/20 172 lb 9.6 oz (78.3 kg)  09/01/20 173 lb 3.2 oz (78.6 kg)     Health Maintenance Due  Topic Date Due  . FOOT EXAM  Never done  . OPHTHALMOLOGY EXAM  Never done  . URINE MICROALBUMIN  Never done  . Hepatitis C Screening  Never done  . COLONOSCOPY (Pts 45-57yrs Insurance coverage will need to be confirmed)  Never done  . MAMMOGRAM  Never done  . DEXA SCAN  Never done  . PNA vac Low Risk Adult (1 of 2 - PCV13) Never done  . COVID-19 Vaccine (3 - Booster for Pfizer series) 12/27/2019    There are no preventive care reminders to display for this patient.  No results found for: TSH Lab Results  Component Value Date   WBC 10.3 09/10/2020   HGB 7.5 (L) 09/10/2020   HCT 23.8 (L) 09/10/2020   MCV 90.5 09/10/2020   PLT 452 (H) 09/10/2020   Lab Results  Component Value Date   NA 139 09/03/2020   K 4.3 09/03/2020   CO2 25 09/03/2020   GLUCOSE 184 (H) 09/03/2020   BUN 10 09/03/2020   CREATININE 0.83 09/03/2020   BILITOT 0.5 08/28/2020   ALKPHOS 124 08/28/2020   AST 13 (L) 08/28/2020   ALT 16 08/28/2020   PROT 7.7  08/28/2020   ALBUMIN 4.3 08/28/2020   CALCIUM 8.9 09/03/2020   ANIONGAP 5 09/03/2020   Lab Results  Component Value Date   CHOL 204 (H) 07/30/2020   Lab Results  Component Value Date   HDL 47 07/30/2020   Lab Results  Component Value Date   LDLCALC 114 (H) 07/30/2020   Lab Results  Component Value Date   TRIG 214 (H) 07/30/2020   Lab  Results  Component Value Date   CHOLHDL 4.3 07/30/2020   Lab Results  Component Value Date   HGBA1C 8.2 (H) 07/24/2020      Assessment & Plan:   Problem List Items Addressed This Visit      Digestive   GERD (gastroesophageal reflux disease)   Relevant Medications   polyethylene glycol (MIRALAX / GLYCOLAX) 17 g packet   pantoprazole (PROTONIX) 40 MG tablet     Other   Acute blood loss anemia   Relevant Medications   ferrous sulfate 325 (65 FE) MG EC tablet    Other Visit Diagnoses    Constipation due to opioid therapy    -  Primary   Relevant Medications   polyethylene glycol (MIRALAX / GLYCOLAX) 17 g packet   Pressure injury of sacral region, stage 2 (HCC)       Relevant Medications   silver sulfADIAZINE (SILVADENE) 1 % cream      Meds ordered this encounter  Medications  . polyethylene glycol (MIRALAX / GLYCOLAX) 17 g packet    Sig: Take 17 g by mouth 2 (two) times daily. Take twice a day every day while on pain medications. Then may take as needed.    Dispense:  30 packet    Refill:  6  . silver sulfADIAZINE (SILVADENE) 1 % cream    Sig: Apply 1 application topically daily. Apply thin layer to wound daily for 7-14 days. Cover with a clean gauze (no taping needed).    Dispense:  50 g    Refill:  0  . ferrous sulfate 325 (65 FE) MG EC tablet    Sig: Take 1 tablet by mouth every Monday, Wednesday, and Friday. For low iron.    Dispense:  90 tablet    Refill:  5  . pantoprazole (PROTONIX) 40 MG tablet    Sig: Take 1 tablet (40 mg total) by mouth daily.    Dispense:  30 tablet    Refill:  11    Follow-up: Return in  about 3 months (around 12/19/2020) for Iron Recheck and follow-up--- needs lab appt today.    Tollie Eth, NP

## 2020-09-18 NOTE — Progress Notes (Signed)
6 

## 2020-09-18 NOTE — Assessment & Plan Note (Signed)
Daily opiates with decreased activity and addition of iron Sx consistent with constipation DISCUSSION Drink at least 64 ounces of water every day Add high fiber foods to diet Stool softener and laxatives may be added- Miralax and Colace can be used together- decrease to once a day if diarrhea develops If not helpful- let me know PLAN Miralax twice a day at least until opiates are stopped- optional Colace F/U if not effective

## 2020-09-18 NOTE — Assessment & Plan Note (Signed)
Reportedly not taking pantoprazole Continues with heartburn symptoms DISCUSSION Start daily pantoprazole 40mg   Take pantoprazole at a different time of day than iron Monitor for signs of bleeding and report immediately PLAN F/U in 4 weeks

## 2020-09-18 NOTE — Assessment & Plan Note (Signed)
Appx 3cm area of erythema and loss to the upper dermal layers of skin at the sacrum. Likely from prolonged sitting and laying in bed during hospitalization DISCUSSION Keep area clean and dry Sit on pillow or lay on side to prevent pressure on the area Use medication cream once a day and cover with clean gauze (7-14 days total) Monitor for worsening symptoms or signs of infection PLAN Silvadene cream once a day F/U in 4 weeks

## 2020-09-18 NOTE — Patient Instructions (Signed)
You will mix the miralax with any drink you would like and take two times a day to help with constipation. If this does not work you can increase to three times a day. Once your symptoms are better and you are having normal bowel movements you can decrease the dose and just take as needed.   I have called in iron (ferrous sulfate) for you. This is to be taken Monday, Wednesday, and Friday of each week to help with anemia. If you are still taking the ferrous gluconate, you can continue this until you finish that prescription.   I have called in pantoprazole for your stomach pain. This should help with the pain and discomfort you have. If this continues, please let me know.   I have sent in a silver cream to apply to your bottom over the wound once every day for one to two weeks. If this does not get better, let me know.   We will recheck your iron levels today and make sure that they are not getting worse.   We will call you with the results.   Let me know if you have any concerns.    Anemia por deficiencia de hierro en los adultos Iron Deficiency Anemia, Adult La anemia por deficiencia de hierro ocurre cuando no hay suficientes glbulos rojos o hemoglobina en la sangre. Esto ocurre por la cantidad deficiente de Development worker, communityhierro en el organismo. La hemoglobina transporta oxgeno a todo el cuerpo. La anemia puede hacer que el cuerpo no obtenga suficiente oxgeno. Cules son las causas?  Consumo insuficiente de alimentos que contienen hierro.  Incapacidad del organismo de Engineer, petroleumabsorber bien el hierro.  En las mujeres, necesidad de ms hierro debido a Chartered loss adjusterun embarazo o a perodos menstruales abundantes.  Cncer.  Hemorragia intestinal.  Muchas extracciones de sangre. Qu incrementa el riesgo?  Estar embarazada.  Ser una adolescente que atraviesa un estirn puberal. [place]ules son los signos o sntomas?  Piel, labios y uas plidos.  Debilidad, mareos y cansarse con facilidad.  Dolor de  Turkmenistancabeza.  Sensacin de que no puede respirar bien cuando se mueve (falta de aire).  Manos y pies fros.  Frecuencia cardaca rpida o latidos cardacos que no son regulares.  Sentirse de mal humor (irritabilidad) o tener respiracin rpida. Estos sntomas son ms frecuentes en los casos de anemia muy grave. Los casos leves de anemia podran no causar sntomas. Cmo se trata? El tratamiento de esta afeccin es descubrir por qu no tiene suficiente hierro y Engineer, miningluego suministrarle ms hierro. Puede incluir lo siguiente:  Agregar alimentos con alto contenido de hierro a Lobbyistsu dieta.  Tomar comprimidos de hierro (suplementos). Si est embarazada o amamantando, es posible que necesite tomar hierro adicional. La dieta generalmente no le proporciona la cantidad de hierro que necesita.  Incluir ms vitamina C en su dieta. La vitamina C ayuda al organismo a Landtomar el hierro. Es posible que deba tomar comprimidos de hierro con un vaso de jugo de naranja o comprimidos de vitamina C.  Medicamentos para disminuir los perodos menstruales abundantes.  Ciruga. Es posible que deba hacerse anlisis de sangre para ver si el tratamiento est funcionando. Si el tratamiento no funciona, es posible que deba realizarse [address] Bl[address]ras pruebas. Siga estas instrucciones en su casa: Medicamentos  Use los medicamentos de venta libre y los recetados solamente como se lo haya indicado el mdico. Esto incluye los comprimidos de hierro y las vitaminas. ? CenterPoint Energyome los comprimidos de hierro con el estmago vaco. Si no los [place] Colonytolera,  tmelos con la comida. ? No beba leche ni tome anticidos en el mismo momento que los comprimidos de hierro. ? Los comprimidos de hierro Banker materia fecal (heces)se vuelvan negras.  Si no tolera tomar comprimidos de hierro, pregntele al [employer] estas otras maneras de incorporar hierro: ? Un tubo (catter) intravenoso. ? Mediante una inyeccin (inyectable) en el msculo. Comida y  bebida  Hable con su mdico antes de cambiar los alimentos que consume. Puede indicarle que coma alimentos que tengan gran cantidad de hierro, como, por ejemplo: ? Hgado. ? Carne de res con bajo contenido de grasa Colman Cater). ? Panes y cereales con hierro agregado. ? Huevos. ? Frutas pasas. ? Verduras de hojas color verde oscuro.  Consuma frutas y verduras frescas con alto contenido de vitaminaC, ya que ayudan a que el organismo incorpore el hierro. Algunos alimentos que contienen gran cantidad de vitaminaC: ? Naranjas. ? Pimientos. ? Tomates. ? Mangos.  Beba suficiente lquido para Radio producer pis (la orina) de color amarillo plido.   Control del estreimiento Si toma comprimidos de hierro, estos pueden causar problemas para defecar (estreimiento). Para prevenir o tratar los problemas para defecar, es posible que deba hacer lo siguiente:  Usar medicamentos recetados o de Sales promotion account executive.  Comer alimentos ricos en fibra. Entre ellos, frijoles, cereales integrales y frutas y verduras frescas.  Limitar los alimentos con alto contenido de grasa y International aid/development worker. Estos incluyen alimentos fritos o dulces. Instrucciones generales  Retome sus actividades normales segn lo indicado por el mdico. Pregntele al mdico qu actividades son seguras para usted.  Mantenga la limpieza propia y la del ambiente que lo rodea.  Concurra a todas visitas de seguimiento como se lo haya indicado el mdico. Esto es importante. Comunquese con un mdico si:  Tiene ganas de vomitar (nuseas) o vomita.  Se siente dbil.  Berenice Primas sin motivo.  Tiene dificultades para defecar; por ejemplo: ? Defeca menos de 3 veces por semana. ? Hace esfuerzo para eliminar heces. ? Tiene heces secas y duras, o ms grandes que lo normal. ? Tiene la sensacin de estar lleno o hinchado. ? Siente dolor en el abdomen bajo. ? No se siente mejor despus de eliminar heces. Solicite ayuda de inmediato si:  Pierde el conocimiento  (se desmaya).  Siente dolor en el pecho.  Tiene dificultad para respirar que: ? Es muy intensa. ? Empeora con la actividad fsica.  Tiene latidos cardacos acelerados o latidos cardacos que no se sienten regulares.  Se siente mareado al levantarse despus de haber estado sentado o acostado. Estos sntomas pueden Customer service manager. No espere a ver si los sntomas desaparecen. Solicite atencin mdica de inmediato. Comunquese con el servicio de emergencias de su localidad (911 en los [place]). No conduzca por sus propios medios [employer] hospital. Resumen  La anemia por deficiencia de hierro se produce cuando hay muy poco hierro en el cuerpo.  El tratamiento de esta afeccin es descubrir por qu no tiene suficiente hierro en el cuerpo y luego suministrarle ms hierro.  Use los medicamentos de venta libre y los recetados solamente como se lo haya indicado el mdico.  Consuma frutas y verduras frescas con alto contenido de vitaminaC.  Obtenga ayuda de inmediato si no puede respirar bien. Esta informacin no tiene Theme park manager el consejo del mdico. Asegrese de hacerle al mdico cualquier pregunta que tenga. Document Revised: 03/28/2019 Document Reviewed: 03/28/2019 Elsevier Patient Education  2021 Elsevier Inc.  Polyethylene Glycol Powder for Oral  Solution Qu es este medicamento? El POLIETILENGLICOL 3350 es un laxante que se [place] para tratar el estreimiento. Aumenta la cantidad de [place]. Las evacuaciones se vuelven ms fciles y frecuentes. Este medicamento puede ser utilizado para otros usos; si tiene alguna pregunta consulte con su proveedor de atencin mdica o con su farmacutico. MARCAS COMUNES: GaviLax, GIALAX, GlycoLax, Healthylax, MiraLax, Smooth LAX, Vita Health Qu le debo informar a mi profesional de la salud antes de tomar este medicamento? Necesitan saber si usted presenta alguno de los siguientes problemas o situaciones: antecedentes  de obstruccin en los intestino nuseas fenilcetonuria problema estomacal o intestinal dolor estomacal cambio repentino en el hbito intestinal que dura ms de 2 semanas vmito una reaccin alrgica o inusual al polietilenglicol (PEG), a otros medicamentos, alimentos, colorantes o conservantes si est embarazada o buscando quedar embarazada si est amamantando a un beb Cmo debo utilizar este medicamento? Tome este medicamento por va oral. selo segn las instrucciones en la etiqueta. Agregue la dosis [place] a 4 a 8 onzas o 120 a 240 ml de agua, jugo, gaseosa, caf o t. No mezcle este medicamento con alimentos o con otros lquidos. No combine con espesantes a base de almidn (p. ej., harina, almidn de maz, arruruz, tapioca, goma xntica). Mezcle bien. Beba la solucin. No lo use con una frecuencia mayor a la indicada. Hable con su proveedor de atencin mdica sobre el uso de este medicamento en nios. Aunque se Social worker a nios tan pequeos como de 16 aos de edad con ciertas afecciones, existen precauciones que deben tomarse. Sobredosis: Pngase en contacto inmediatamente con un centro toxicolgico o una sala de urgencia si usted cree que haya tomado demasiado medicamento. ATENCIN: [employer] es solo para usted. No comparta este medicamento con nadie. Qu sucede si me olvido de una dosis? Si olvida una dosis, adminstrela lo antes posible. Si es casi la hora de la prxima dosis, administre solo esa dosis. No se administre dosis adicionales o dobles. Qu puede interactuar con este medicamento? No se anticipan interacciones. Puede ser que esta lista no menciona todas las posibles interacciones. Informe a su profesional de [employer] de [employer] productos a base de hierbas, medicamentos de [place] o suplementos nutritivos que est tomando. Si usted fuma, consume bebidas alcohlicas o si utiliza drogas ilegales, indqueselo tambin a su profesional de [employer]. Algunas sustancias  pueden interactuar con su medicamento. A qu debo estar atento al usar [employer]? No lo use por ms de una semana sin el consejo de su mdico o profesional de Radiographer, therapeutic. Si vuelve a tener estreimiento, consulte con su proveedor de Psychologist, prison and probation services. Beba abundante cantidad de agua mientras [place] este medicamento. Beber agua ayuda a disminuir el estreimiento. Deje de usar [employer] y contacte a su proveedor de atencin mdica si tiene sangrado rectal o no tiene una evacuacin despus de usarlo. Estos pueden ser signos de una afeccin mdica ms grave. Qu efectos secundarios puedo tener al [employer] este medicamento? Efectos secundarios que debe informar a su mdico o a Producer, television/film/video de la salud tan pronto como sea posible: Therapist, art (erupcin cutnea, comezn/picazn o urticaria; hinchazn de la cara, los labios o la lengua) diarrea inflamacin, distensin o Theme park manager graves problemas para respirar vmito Efectos secundarios que generalmente no requieren atencin mdica (infrmelos a su mdico o a Producer, television/film/video de la salud si persisten o si son molestos): nuseas eliminar gases Programme researcher, broadcasting/film/video Puede ser que esta lista no menciona todos los  posibles efectos secundarios. Comunquese a su mdico por asesoramiento mdico [employer]. Usted puede informar los efectos secundarios a la FDA por telfono al 1-800-FDA-1088. Dnde debo guardar mi medicina? Mantenga fuera del alcance de nios y Neurosurgeon. Guarde a [employer], entre 20 y 25 grados Celsius (68 y 65 grados Fahrenheit). Deseche todo el medicamento que no haya utilizado despus de la fecha de vencimiento. Para desechar el medicamento que ya no necesite o que est vencido: Lleve el medicamento a un programa de recuperacin de medicamentos. Consulte con su farmacia o con una entidad reguladora para encontrar un lugar donde llevarlo. Si no puede devolver el medicamento, consulte la etiqueta o  el prospecto para ver si debe desecharlo en la basura o arrojarlo por el sanitario. Si no est seguro, consulte con su proveedor de [employer]. Si es seguro colocarlo en la basura, saque el medicamento del recipiente. Mezcle el medicamento con piedras sanitarias para gatos, tierra, posos (residuos) de caf u otro desperdicio. Coloque la [employer] bolsa o recipiente que quede [place]. Deseche en la basura. ATENCIN: Este folleto es un resumen. Puede ser que no cubra toda la posible informacin. Si usted tiene preguntas acerca de esta medicina, consulte con su mdico, su farmacutico o su profesional de Radiographer, therapeutic.  2021 Elsevier/Gold Standard (2020-04-08 00:00:00)

## 2020-09-18 NOTE — Assessment & Plan Note (Signed)
Fatigue continues. No signs of acute blood loss. Does not appear to be taking ferrous gluconate BID DISCUSSION Start ferrous gluconate BID (76mg  elemental iron) (pick up from [facility]) Add ferrous sulfate once a day 325mg  M-W-F Utilize miralax BID for constipation May add colace to daily regimen up to three times a day Monitor for any signs of bleeding or worsening symptoms PLAN Ferrous gluconate BID plus Ferrous sulfate MWF for total dosing of 181mg  iron MWF and 76mg  iron STRS.  Variant compounds utilized to replace Fe and prevent GI distress Repeat labs today Follow-up in 4 weeks. Will need repeat Hgb in 4 weeks

## 2020-09-18 NOTE — Assessment & Plan Note (Signed)
Resolved- no S/S present today F/U if symptoms return

## 2020-09-21 NOTE — Progress Notes (Signed)
Hemoglobin is improving. Platelets elevated, likely due to anemia condition.  Continue ferrous sulfate every Monday Wednesday and Friday.  Recommend continuing ferrous gluconate for the next two months then stopping that, but continuing the ferrous sulfate.  Recheck labs in 8 weeks

## 2020-09-22 ENCOUNTER — Telehealth (HOSPITAL_BASED_OUTPATIENT_CLINIC_OR_DEPARTMENT_OTHER): Payer: Self-pay

## 2020-09-22 NOTE — Telephone Encounter (Signed)
-----   Message from Tollie Eth, NP sent at 09/21/2020  1:06 PM EDT ----- Hemoglobin is improving. Platelets elevated, likely due to anemia condition.  Continue ferrous sulfate every Monday Wednesday and Friday.  Recommend continuing ferrous gluconate for the next two months then stopping that, but continuing the ferrous sulfate.  Recheck labs in 8 weeks

## 2020-09-22 NOTE — Telephone Encounter (Signed)
Results released by Shawna Clamp, AGNP.  Called patient to go over lab results.  Spoke with her daughter Darrick Meigs.  She is aware and agreeable.  Instructed her to contact the office with any questions or concerns.

## 2020-09-29 MED ORDER — LIDOCAINE HCL 1 % IJ SOLN
3.0000 mL | INTRAMUSCULAR | Status: AC | PRN
  Administered 2020-09-16: 3 mL

## 2020-09-29 MED ORDER — METHYLPREDNISOLONE ACETATE 40 MG/ML IJ SUSP
40.0000 mg | INTRAMUSCULAR | Status: AC | PRN
  Administered 2020-09-16: 40 mg via INTRA_ARTICULAR

## 2020-09-29 MED ORDER — BUPIVACAINE HCL 0.25 % IJ SOLN
6.0000 mL | INTRAMUSCULAR | Status: AC | PRN
  Administered 2020-09-16: 6 mL via INTRA_ARTICULAR

## 2020-10-14 ENCOUNTER — Ambulatory Visit (INDEPENDENT_AMBULATORY_CARE_PROVIDER_SITE_OTHER): Payer: Self-pay

## 2020-10-14 ENCOUNTER — Other Ambulatory Visit: Payer: Self-pay

## 2020-10-14 ENCOUNTER — Ambulatory Visit (INDEPENDENT_AMBULATORY_CARE_PROVIDER_SITE_OTHER): Payer: Self-pay | Admitting: Specialist

## 2020-10-14 ENCOUNTER — Encounter: Payer: Self-pay | Admitting: Specialist

## 2020-10-14 VITALS — BP 159/77 | HR 77 | Ht 61.0 in | Wt 177.0 lb

## 2020-10-14 DIAGNOSIS — Z981 Arthrodesis status: Secondary | ICD-10-CM

## 2020-10-14 DIAGNOSIS — Z9889 Other specified postprocedural states: Secondary | ICD-10-CM

## 2020-10-14 MED ORDER — HYDROCODONE-ACETAMINOPHEN 5-325 MG PO TABS: 1.0000 | ORAL_TABLET | Freq: Four times a day (QID) | ORAL | 0 refills | Status: DC | PRN

## 2020-10-14 NOTE — Progress Notes (Signed)
Post-Op Visit Note   Patient: Julia Knight           Date of Birth: 10-07-1950           MRN: [id number] Visit Date: 10/14/2020 PCP: Tollie Eth, NP   Assessment & Plan:6 weeks post L1 to S1 central laminectomy with TLIF L4-5 and L5-S1 for decompression of severe lumbar spinal stenosis with spondylolisthesis L4-5 and L5-S1.    Chief Complaint:  Chief Complaint  Patient presents with  . Lower Back - Routine Post Op   Visit Diagnoses:  1. S/P lumbar fusion   2. S/P lumbar laminectomy   Incision is healed. Lower extremity motor is normal. Radiographs with hardware in good position and alignmen. Central laminectomy L1 to S1  Plan:    Call if there is increasing drainage, fever greater than 101.5, severe head aches, and worsening nausea or light sensitivity. If shortness of breath, bloody cough or chest tightness or pain go to an emergency room. No lifting greater than 10 lbs. Avoid bending, stooping and twisting. Use brace when sitting and out of bed even to go to bathroom. Walk in house for first 2 weeks then may start to get out slowly increasing distances up to one quarter mile by 4-6 weeks post op. Use cane as needed for ambulation Meds for discomfort. May be alone at home while daughter returns to work. Please call and return for scheduled follow up appointment 2 weeks from the time of surgery.    Follow-Up Instructions: No follow-ups on file.   Orders:  Orders Placed This Encounter  Procedures  . XR Lumbar Spine 2-3 Views   No orders of the defined types were placed in this encounter.   Imaging: XR Lumbar Spine 2-3 Views  Result Date: 10/14/2020 Ap and lateral radiographs of the lumbar spine demonstrates presence of pedicle screws and rods L4-5 and L5-S1 with interbody cages in good position and alignment. Presence of a central laminectomy L1 to S1 with no anterolisthesis, DDD at the T-L junction.   PMFS History: Patient Active Problem  List   Diagnosis Date Noted  . Postoperative anemia due to acute blood loss 09/03/2020    Priority: High    Class: Acute  . Spondylolisthesis at L4-L5 level 09/01/2020    Priority: High    Class: Chronic  . Constipation due to opioid therapy 09/18/2020  . Pressure injury of sacral region, stage 2 (HCC) 09/18/2020  . Acute blood loss anemia 09/10/2020  . Glossitis 09/10/2020  . Spinal stenosis, 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  . Status post lumbar spinal fusion 09/01/2020  . Hyperlipidemia 07/24/2020  . BMI 34.0-34.9,adult 05/22/2020  . Spondylolisthesis at L3-L4 level 05/22/2020  . Anxiety 06/07/2018  . GERD (gastroesophageal reflux disease) 06/03/2013  . Left-sided low back pain with sciatica 10/09/2012  . Left leg pain 08/14/2012  . Depression 03/08/2012  . Allergic rhinitis 08/24/2011  . Essential hypertension 08/24/2011  . T2DM (type 2 diabetes mellitus) (HCC) 02/28/2011   Past Medical History:  Diagnosis Date  . Allergy   . Anxiety   . Arthritis    back and hands  . COVID-19 11/16/2018  . Depression   . Diabetes mellitus without complication (HCC)    type 2  . Dizziness 08/16/2017  . Encounter for medical examination to establish care 07/24/2020  . Gastroenteritis, infectious, presumed 08/16/2017  . GERD (gastroesophageal reflux disease)   . Hyperlipemia   . Hypertension   . Neuromuscular disorder (HCC)  No family history on file.  Past Surgical History:  Procedure Laterality Date  . APPENDECTOMY    . CHOLECYSTECTOMY    . TONSILLECTOMY     removed as an adult   Social History   Occupational History  . Not on file  Tobacco Use  . Smoking status: Former Smoker    Types: Cigarettes  . Smokeless tobacco: Never Used  Vaping Use  . Vaping Use: Never used  Substance and Sexual Activity  . Alcohol use: Never  . Drug use: Never  . Sexual activity: Not Currently

## 2020-10-14 NOTE — Patient Instructions (Signed)
Call if there is increasing drainage, fever greater than 101.5, severe head aches, and worsening nausea or light sensitivity. If shortness of breath, bloody cough or chest tightness or pain go to an emergency room. No lifting greater than 10 lbs. Avoid bending, stooping and twisting. Use brace when sitting and out of bed even to go to bathroom. Walk in house for first 2 weeks then may start to get out slowly increasing distances up to one quarter mile by 4-6 weeks post op. Use cane as needed for ambulation Meds for discomfort. May be alone at home while daughter returns to work. Please call and return for scheduled follow up appointment 2 weeks from the time of surgery.

## 2020-10-26 ENCOUNTER — Ambulatory Visit (INDEPENDENT_AMBULATORY_CARE_PROVIDER_SITE_OTHER): Payer: Self-pay | Admitting: Nurse Practitioner

## 2020-10-26 ENCOUNTER — Encounter (HOSPITAL_BASED_OUTPATIENT_CLINIC_OR_DEPARTMENT_OTHER): Payer: Self-pay | Admitting: Nurse Practitioner

## 2020-10-26 ENCOUNTER — Other Ambulatory Visit: Payer: Self-pay

## 2020-10-26 VITALS — BP 139/59 | HR 70 | Ht 61.0 in | Wt 182.2 lb

## 2020-10-26 DIAGNOSIS — Z981 Arthrodesis status: Secondary | ICD-10-CM

## 2020-10-26 DIAGNOSIS — Z6834 Body mass index (BMI) 34.0-34.9, adult: Secondary | ICD-10-CM

## 2020-10-26 DIAGNOSIS — Z13820 Encounter for screening for osteoporosis: Secondary | ICD-10-CM

## 2020-10-26 DIAGNOSIS — Z1211 Encounter for screening for malignant neoplasm of colon: Secondary | ICD-10-CM

## 2020-10-26 DIAGNOSIS — M5442 Lumbago with sciatica, left side: Secondary | ICD-10-CM

## 2020-10-26 DIAGNOSIS — M79605 Pain in left leg: Secondary | ICD-10-CM

## 2020-10-26 DIAGNOSIS — G8929 Other chronic pain: Secondary | ICD-10-CM

## 2020-10-26 DIAGNOSIS — D62 Acute posthemorrhagic anemia: Secondary | ICD-10-CM

## 2020-10-26 DIAGNOSIS — I10 Essential (primary) hypertension: Secondary | ICD-10-CM

## 2020-10-26 DIAGNOSIS — Z1231 Encounter for screening mammogram for malignant neoplasm of breast: Secondary | ICD-10-CM

## 2020-10-26 DIAGNOSIS — E1165 Type 2 diabetes mellitus with hyperglycemia: Secondary | ICD-10-CM

## 2020-10-26 LAB — POCT UA - MICROALBUMIN
Albumin/Creatinine Ratio, Urine, POC: <30
Creatinine, POC: 100 mg/dL
Microalbumin Ur, POC: 10 mg/L

## 2020-10-26 LAB — POCT GLYCOSYLATED HEMOGLOBIN (HGB A1C): HbA1c POC (<> result, manual entry): 7.1 % (ref 4.0–5.6)

## 2020-10-26 MED ORDER — HYDROCODONE-ACETAMINOPHEN 5-325 MG PO TABS: 1.0000 | ORAL_TABLET | Freq: Three times a day (TID) | ORAL | 0 refills | Status: DC

## 2020-10-26 MED ORDER — GABAPENTIN 300 MG PO CAPS
ORAL_CAPSULE | ORAL | 3 refills | Status: DC
Start: 2020-10-26 — End: 2022-01-06

## 2020-10-26 NOTE — Assessment & Plan Note (Signed)
BMI 34.43 Increase activity as tolerated Recommend monitoring diet for low carbohydrates and low fats

## 2020-10-26 NOTE — Assessment & Plan Note (Signed)
Low back pain and sciatica continue post-op She requests additional pain medication for low back- will provide 30 tabs with instructions that future refills must come from spinal surgeon.  Recommend gabapentin for pain 300mg  TID.  F/U with surgeon for further recommendations on activity and pain control

## 2020-10-26 NOTE — Patient Instructions (Addendum)
Revise su Architect en unos das para ver los [place] de laboratorio. Te llamaremos si necesitamos cambiar algo.  Planificaremos un seguimiento en 6 meses para su diabetes.  Si no se ha hecho un examen de la vista en el ltimo ao, le recomiendo que se lo haga pronto. La diabetes es daina para los ojos y Chiropractor ceguera si no se controla de Loss adjuster, chartered.  Contine con sus medicamentos. Deberas estar tomando:  Metformina 1000 mg con desayuno y 1000 mg con cena. Esto es para la diabetes.  Atrovastatina 40 mg a la hora de [employer] Esto es para Nurse, learning disability.  Pantoprazol 40 mg) una vez al da. Esto es para el reflujo cido.  Carbonato de calcio 1500 mg cada maana. Esto es para tus huesos.  Sulfato ferroso 325 mg todos los lunes, mircoles y viernes con el desayuno. Esto es para niveles bajos de hierro.   Por favor programe su prxima cita en 6 meses. Si necesita verme antes, por favor llame y programe una cita.  Puede tomar gabapentina (cpsula amarilla) para el dolor nervioso en la cadera, la espalda y la pierna. Tome esto cada 8 horas para Chartered certified accountant.

## 2020-10-26 NOTE — Assessment & Plan Note (Signed)
Recheck Hgb today- has been on oral iron supplementation. Continue Monday, Wednesday, Friday Ferrous Sulfate 325mg  until otherwise noted.

## 2020-10-26 NOTE — Assessment & Plan Note (Signed)
Neuropathic pain in left leg- per patient this is improving Recommend gabapentin 300mg  TID for pain daily Continue activity as directed by surgery

## 2020-10-26 NOTE — Assessment & Plan Note (Signed)
BP 139/59 today Will monitor- goal < 140/90 Labs today

## 2020-10-26 NOTE — Assessment & Plan Note (Signed)
A1c 7.1% today Urine microalbumin negative Foot exam reveals decreased sensation, although, not clear if this is related to recent spinal surgery- will recheck in 6 months.  Not checking blood sugars at home Recommend COVID booster- set up from pharmacy of your choice Will plan for pneumonia vaccine at next visit. Continue metformin 1000mg  twice a day Follow low carbohydrate diet and increase exercise as tolerated F/U in 6 months

## 2020-10-26 NOTE — Progress Notes (Signed)
Established Patient Office Visit  Subjective:  Patient ID: Julia Knight, female    DOB: [birth year]  Age: 70 y.o. MRN: [id number]  CC:  Chief Complaint  Patient presents with   Follow-up    Patient states she is here for anemia.    HPI Julia Knight presents for f/u for DM, HTN, HLD. She is here today with an interpreter, Claudine.   She is not checking her blood sugars at home.  She reports that she is taking her medication as prescribed without missed doses. No side effects reported She is monitoring her diet She is increasing her activity.   Have you had a pneumonia vaccine: NO How many: N/A Where was this done: N/A Have you had a COVID booster: NO How many: N/A Have you had colon cancer screening (Colonoscopy/Cologuard): NO When: N/A  Where: N/A Have you had breast cancer screening (Mammogram): NO When: N/A Where: N/A Have you had Bone Density Testing: NO When: N/A Where: N/A Have you had a Shingles vaccine: NO When: N/A Where: N/A  BACK PAIN She endorses ongoing back pain from surgery She tells me that tylenol and ibuprofen are not helpful for her pain She reports the majority of the pain is in the left leg going down into her foot.  She tells me the back doctor told her this will improve over the next several months to years. She does have intermittent back pain from surgery that is only controlled with percocet 5-325. She reports that she has been taking 1/2 to 1 tablet only as needed. She needs a refill on this.  Her last appointment with the surgeon is 7/26.  ANEMIA Has been taking medication as prescribed.  No further bleeding.  No excessive fatigue.  No concerns with constipation reported.   Past Medical History:  Diagnosis Date   Acute blood loss anemia 09/10/2020   Allergic rhinitis 08/24/2011   Allergy    Anxiety    Arthritis    back and hands   Constipation due to opioid therapy 09/18/2020   COVID-19 11/16/2018   Depression     Diabetes mellitus without complication (HCC)    type 2   Dizziness 08/16/2017   Encounter for medical examination to establish care 07/24/2020   Gastroenteritis, infectious, presumed 08/16/2017   GERD (gastroesophageal reflux disease)    Glossitis 09/10/2020   Hyperlipemia    Hypertension    Neuromuscular disorder (HCC)     Past Surgical History:  Procedure Laterality Date   APPENDECTOMY     CHOLECYSTECTOMY     TONSILLECTOMY     removed as an adult    History reviewed. No pertinent family history.  Social History   Socioeconomic History   Marital status: Widowed    Spouse name: Not on file   Number of children: Not on file   Years of education: Not on file   Highest education level: Not on file  Occupational History   Not on file  Tobacco Use   Smoking status: Former    Pack years: 0.00    Types: Cigarettes   Smokeless tobacco: Never  Vaping Use   Vaping Use: Never used  Substance and Sexual Activity   Alcohol use: Never   Drug use: Never   Sexual activity: Not Currently  Other Topics Concern   Not on file  Social History Narrative   ** Merged History Encounter **       Social Determinants of Health   Financial Resource Strain: Not  on file  Food Insecurity: Not on file  Transportation Needs: Not on file  Physical Activity: Not on file  Stress: Not on file  Social Connections: Not on file  Intimate Partner Violence: Not on file    Outpatient Medications Prior to Visit  Medication Sig Dispense Refill   atorvastatin (LIPITOR) 40 MG tablet TAKE 1 TABLET (40MG ) BY MOUTH EVERY NIGHT AT BEDTIME. (Patient taking differently: Take 40 mg by mouth at bedtime.) 30 tablet 11   calcium carbonate (OSCAL) 1500 (600 Ca) MG TABS tablet Take 600 mg of elemental calcium by mouth in the morning.     docusate sodium (COLACE) 100 MG capsule Take 1 capsule (100 mg total) by mouth 2 (two) times daily. 60 capsule 0   ferrous sulfate 325 (65 FE) MG EC tablet Take 1 tablet by mouth  every Monday, Wednesday, and Friday. For low iron. 90 tablet 5   metFORMIN (GLUCOPHAGE) 1000 MG tablet TAKE 1 TABLET (1000MG ) BY MOUTH EVERY MORNING AND 1 TABLET (1000MG ) BY MOUTH EVERY EVENING WITH A MEAL. 60 tablet 11   Multiple Vitamin (MULTIVITAMIN WITH MINERALS) TABS tablet Take 1 tablet by mouth in the morning.     omega-3 acid ethyl esters (LOVAZA) 1 g capsule Take 1 g by mouth daily.     pantoprazole (PROTONIX) 40 MG tablet Take 1 tablet (40 mg total) by mouth daily. 30 tablet 11   polyethylene glycol (MIRALAX / GLYCOLAX) 17 g packet Take 17 g by mouth 2 (two) times daily. Take twice a day every day while on pain medications. Then may take as needed. 30 packet 6   gabapentin (NEURONTIN) 300 MG capsule Take 1 capsule (300 mg total) by mouth 2 (two) times daily. 40 capsule 1   HYDROcodone-acetaminophen (NORCO/VICODIN) 5-325 MG tablet Take 1 tablet by mouth every 6 (six) hours as needed for moderate pain. 30 tablet 0   methocarbamol (ROBAXIN) 500 MG tablet Take 1 tablet (500 mg total) by mouth every 8 (eight) hours as needed for muscle spasms. 40 tablet 1   oxyCODONE-acetaminophen (PERCOCET/ROXICET) 5-325 MG tablet Take 1 tablet by mouth every 6 (six) hours as needed for severe pain. 50 tablet 0   silver sulfADIAZINE (SILVADENE) 1 % cream Apply 1 application topically daily. Apply thin layer to wound daily for 7-14 days. Cover with a clean gauze (no taping needed). 50 g 0   ferrous gluconate (FERGON) 324 MG tablet Take 1 tablet (324 mg total) by mouth 2 (two) times daily with a meal. 40 tablet 1   magic mouthwash w/lidocaine SOLN Take 15 mLs by mouth 4 (four) times daily as needed for mouth pain. Swish, gargle and spit. 500 mL 1   No facility-administered medications prior to visit.    No Known Allergies  ROS Review of Systems All review of systems negative except what is listed in the HPI    Objective:    Physical Exam Vitals and nursing note reviewed.  Constitutional:       Appearance: Normal appearance.  HENT:     Head: Normocephalic and atraumatic.  Eyes:     Extraocular Movements: Extraocular movements intact.     Conjunctiva/sclera: Conjunctivae normal.     Pupils: Pupils are equal, round, and reactive to light.  Neck:     Vascular: No carotid bruit.  Cardiovascular:     Rate and Rhythm: Normal rate and regular rhythm.     Pulses: Normal pulses.     Heart sounds: Normal heart sounds.  Pulmonary:  Effort: Pulmonary effort is normal.     Breath sounds: Normal breath sounds.  Abdominal:     General: Abdomen is flat.     Palpations: Abdomen is soft.  Musculoskeletal:        General: Normal range of motion.     Cervical back: Normal range of motion and neck supple.     Right lower leg: No edema.     Left lower leg: No edema.  Skin:    General: Skin is warm and dry.     Capillary Refill: Capillary refill takes less than 2 seconds.  Neurological:     General: No focal deficit present.     Mental Status: She is alert and oriented to person, place, and time.     Cranial Nerves: No cranial nerve deficit.  Psychiatric:        Mood and Affect: Mood normal.        Behavior: Behavior normal.        Thought Content: Thought content normal.        Judgment: Judgment normal.    BP (!) 139/59   Pulse 70   Ht 5\' 1"  (1.549 m)   Wt 182 lb 3.2 oz (82.6 kg)   SpO2 100%   BMI 34.43 kg/m  Wt Readings from Last 3 Encounters:  10/26/20 182 lb 3.2 oz (82.6 kg)  10/14/20 177 lb (80.3 kg)  09/18/20 177 lb (80.3 kg)     Health Maintenance Due  Topic Date Due   OPHTHALMOLOGY EXAM  Never done   Hepatitis C Screening  Never done   COLONOSCOPY (Pts 45-55yrs Insurance coverage will need to be confirmed)  Never done   MAMMOGRAM  Never done   Zoster Vaccines- Shingrix (1 of 2) Never done   DEXA SCAN  Never done   PNA vac Low Risk Adult (1 of 2 - PCV13) Never done   COVID-19 Vaccine (3 - Booster for Pfizer series) 12/27/2019    There are no preventive  care reminders to display for this patient.  No results found for: TSH Lab Results  Component Value Date   WBC 13.2 (H) 09/18/2020   HGB 8.1 (L) 09/18/2020   HCT 26.8 (L) 09/18/2020   MCV 92.7 09/18/2020   PLT 482 (H) 09/18/2020   Lab Results  Component Value Date   NA 139 09/03/2020   K 4.3 09/03/2020   CO2 25 09/03/2020   GLUCOSE 184 (H) 09/03/2020   BUN 10 09/03/2020   CREATININE 0.83 09/03/2020   BILITOT 0.5 08/28/2020   ALKPHOS 124 08/28/2020   AST 13 (L) 08/28/2020   ALT 16 08/28/2020   PROT 7.7 08/28/2020   ALBUMIN 4.3 08/28/2020   CALCIUM 8.9 09/03/2020   ANIONGAP 5 09/03/2020   Lab Results  Component Value Date   CHOL 204 (H) 07/30/2020   Lab Results  Component Value Date   HDL 47 07/30/2020   Lab Results  Component Value Date   LDLCALC 114 (H) 07/30/2020   Lab Results  Component Value Date   TRIG 214 (H) 07/30/2020   Lab Results  Component Value Date   CHOLHDL 4.3 07/30/2020   Lab Results  Component Value Date   HGBA1C 7.1 10/26/2020      Assessment & Plan:   Problem List Items Addressed This Visit       Acute   Postoperative anemia due to acute blood loss    Recheck Hgb today- has been on oral iron supplementation. Continue Monday, Wednesday, Friday  Ferrous Sulfate 325mg  until otherwise noted.        Relevant Orders   POCT glycosylated hemoglobin (Hb A1C) (Completed)   Hemoglobin     Other   BMI 34.0-34.9,adult    BMI 34.43 Increase activity as tolerated Recommend monitoring diet for low carbohydrates and low fats       Essential hypertension    BP 139/59 today Will monitor- goal < 140/90 Labs today       Relevant Orders   POCT UA - Microalbumin (Completed)   Basic Metabolic Panel (BMET)   Left leg pain    Neuropathic pain in left leg- per patient this is improving Recommend gabapentin 300mg  TID for pain daily Continue activity as directed by surgery       Left-sided low back pain with sciatica    Low back pain  and sciatica continue post-op She requests additional pain medication for low back- will provide 30 tabs with instructions that future refills must come from spinal surgeon.  Recommend gabapentin for pain 300mg  TID.  F/U with surgeon for further recommendations on activity and pain control       Relevant Medications   HYDROcodone-acetaminophen (NORCO/VICODIN) 5-325 MG tablet   gabapentin (NEURONTIN) 300 MG capsule   T2DM (type 2 diabetes mellitus) (HCC) - Primary    A1c 7.1% today Urine microalbumin negative Foot exam reveals decreased sensation, although, not clear if this is related to recent spinal surgery- will recheck in 6 months.  Not checking blood sugars at home Recommend COVID booster- set up from pharmacy of your choice Will plan for pneumonia vaccine at next visit. Continue metformin 1000mg  twice a day Follow low carbohydrate diet and increase exercise as tolerated F/U in 6 months       Relevant Orders   POCT glycosylated hemoglobin (Hb A1C) (Completed)   POCT UA - Microalbumin (Completed)   HM DIABETES FOOT EXAM (Completed)   Basic Metabolic Panel (BMET)   Status post lumbar spinal fusion   Relevant Medications   HYDROcodone-acetaminophen (NORCO/VICODIN) 5-325 MG tablet   Other Visit Diagnoses     Encounter for screening mammogram for malignant neoplasm of breast       Relevant Orders   MM 3D SCREEN BREAST BILATERAL   Colon cancer screening       Relevant Orders   Cologuard   Osteoporosis screening       Relevant Orders   DG Bone Density       Meds ordered this encounter  Medications   HYDROcodone-acetaminophen (NORCO/VICODIN) 5-325 MG tablet    Sig: Take 1 tablet by mouth every 8 (eight) hours. All future prescriptions must come from surgery. Tome una tableta por via oral cada 8 horas segun sea necesario para el dolor. Cualquier receta futura debe provenir de la [birth year]-28[birth year]1974.    Dispense:  30 tablet    Refill:  0   gabapentin (NEURONTIN) 300 MG capsule     Sig: Take one tablet (300mg )  by mouth every 8 hours as needed for pain. Tome una tableta por oral cada 8 horas segun sea necesario para el dolor.    Dispense:  90 capsule    Refill:  3    Follow-up: Return in about 6 months (around 04/27/2021) for DM.    , NP

## 2020-10-27 LAB — BASIC METABOLIC PANEL
BUN/Creatinine Ratio: 25 (ref 12–28)
BUN: 20 mg/dL (ref 8–27)
CO2: 20 mmol/L (ref 20–29)
Calcium: 9.6 mg/dL (ref 8.7–10.3)
Chloride: 102 mmol/L (ref 96–106)
Creatinine, Ser: 0.8 mg/dL (ref 0.57–1.00)
Glucose: 216 mg/dL — ABNORMAL HIGH (ref 65–99)
Potassium: 4.9 mmol/L (ref 3.5–5.2)
Sodium: 139 mmol/L (ref 134–144)
eGFR: 81 mL/min/{1.73_m2} (ref >59)

## 2020-10-27 LAB — SPECIMEN STATUS REPORT

## 2020-10-27 LAB — HEMOGLOBIN: Hemoglobin: 11.3 g/dL (ref 11.1–15.9)

## 2020-10-28 ENCOUNTER — Ambulatory Visit: Payer: Self-pay | Admitting: Family Medicine

## 2020-10-29 ENCOUNTER — Telehealth (HOSPITAL_BASED_OUTPATIENT_CLINIC_OR_DEPARTMENT_OTHER): Payer: Self-pay

## 2020-10-29 NOTE — Telephone Encounter (Signed)
-----   Message from Sara E Early, NP sent at 10/27/2020  9:33 AM EDT ----- Hemoglobin normal. Kidney function looks good.  Continue ferrous sulfate 325mg two days a week.  Recheck in 6 months 

## 2020-11-03 ENCOUNTER — Telehealth (HOSPITAL_BASED_OUTPATIENT_CLINIC_OR_DEPARTMENT_OTHER): Payer: Self-pay

## 2020-11-03 NOTE — Telephone Encounter (Signed)
-----   Message from Tollie Eth, NP sent at 10/27/2020  9:33 AM EDT ----- Hemoglobin normal. Kidney function looks good.  Continue ferrous sulfate 325mg  two days a week.  Recheck in 6 months

## 2020-11-03 NOTE — Telephone Encounter (Signed)
Called patient using [employer] [phone]) to discuss her lab results.  She is aware and understands.

## 2020-11-19 ENCOUNTER — Ambulatory Visit (HOSPITAL_BASED_OUTPATIENT_CLINIC_OR_DEPARTMENT_OTHER): Payer: Self-pay

## 2020-11-19 ENCOUNTER — Ambulatory Visit (HOSPITAL_BASED_OUTPATIENT_CLINIC_OR_DEPARTMENT_OTHER): Payer: Self-pay | Admitting: Radiology

## 2020-12-02 ENCOUNTER — Other Ambulatory Visit: Payer: Self-pay

## 2020-12-02 ENCOUNTER — Ambulatory Visit (INDEPENDENT_AMBULATORY_CARE_PROVIDER_SITE_OTHER): Payer: Self-pay

## 2020-12-02 ENCOUNTER — Ambulatory Visit (INDEPENDENT_AMBULATORY_CARE_PROVIDER_SITE_OTHER): Payer: Self-pay | Admitting: Specialist

## 2020-12-02 ENCOUNTER — Encounter: Payer: Self-pay | Admitting: Specialist

## 2020-12-02 VITALS — BP 149/80 | HR 63 | Ht 61.0 in | Wt 182.2 lb

## 2020-12-02 DIAGNOSIS — Z981 Arthrodesis status: Secondary | ICD-10-CM

## 2020-12-02 NOTE — Progress Notes (Signed)
Post-Op Visit Note   Patient: Julia Knight           Date of Birth: 1950-12-29           MRN: [id number] Visit Date: 12/02/2020 PCP: Tollie Eth, NP   Assessment & Plan: 3 months post central laminectomies L1-2 through L4-5 with TLIFs L3-4 and L4-5  Chief Complaint:  Chief Complaint  Patient presents with   Lower Back - Routine Post Op    3 months post Central Laminectomy L1-2, L2-3, L3-4, L4-5 and TLIF L3-4, L4-5. States that she is doing good, wearing the brace at all times expect to sit or sleep   Some numbness right great toe, but overall much better interms of pain and increased standing and walking. No bowel or bladder difficulty. Radiographs today. Incision is healed. Motor both legs normal SLR negative. Hip ROM normal. Visit Diagnoses:  1. S/P lumbar fusion     Plan: Avoid frequent bending and stooping  No lifting greater than 15-20 lbs. May use ice or moist heat for pain. Weight loss is of benefit. Best medication for lumbar disc disease is arthritis medications like motrin, celebrex and naprosyn. Exercise is important to improve your indurance and does allow people to function better inspite of back pain. Recommend weaning from brace 2 hours out of brace per day first week. Then 4 hours the second week and 6 hours per day the 3rd week then 8 hours per day the 4th week and then stop wearing the brace.  Or you can wear half days for 2 weeks and then stop use of the brace.  Follow-Up Instructions: No follow-ups on file.   Orders:  Orders Placed This Encounter  Procedures   XR Lumbar Spine 2-3 Views   No orders of the defined types were placed in this encounter.   Imaging: No results found.  PMFS History: Patient Active Problem List   Diagnosis Date Noted   Postoperative anemia due to acute blood loss 09/03/2020    Priority: High    Class: Acute   Spondylolisthesis at L4-L5 level 09/01/2020    Priority: High    Class: Chronic    Pressure injury of sacral region, stage 2 (HCC) 09/18/2020   Spinal stenosis, 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   Status post lumbar spinal fusion 09/01/2020   Hyperlipidemia 07/24/2020   BMI 34.0-34.9,adult 05/22/2020   Spondylolisthesis at L3-L4 level 05/22/2020   Anxiety 06/07/2018   GERD (gastroesophageal reflux disease) 06/03/2013   Left-sided low back pain with sciatica 10/09/2012   Left leg pain 08/14/2012   Depression 03/08/2012   Essential hypertension 08/24/2011   T2DM (type 2 diabetes mellitus) (HCC) 02/28/2011   Past Medical History:  Diagnosis Date   Acute blood loss anemia 09/10/2020   Allergic rhinitis 08/24/2011   Allergy    Anxiety    Arthritis    back and hands   Constipation due to opioid therapy 09/18/2020   COVID-19 11/16/2018   Depression    Diabetes mellitus without complication (HCC)    type 2   Dizziness 08/16/2017   Encounter for medical examination to establish care 07/24/2020   Gastroenteritis, infectious, presumed 08/16/2017   GERD (gastroesophageal reflux disease)    Glossitis 09/10/2020   Hyperlipemia    Hypertension    Neuromuscular disorder (HCC)     No family history on file.  Past Surgical History:  Procedure Laterality Date   APPENDECTOMY     CHOLECYSTECTOMY     TONSILLECTOMY  removed as an adult   Social History   Occupational History   Not on file  Tobacco Use   Smoking status: Former    Types: Cigarettes   Smokeless tobacco: Never  Vaping Use   Vaping Use: Never used  Substance and Sexual Activity   Alcohol use: Never   Drug use: Never   Sexual activity: Not Currently

## 2020-12-02 NOTE — Patient Instructions (Addendum)
Plan: Avoid frequent bending and stooping  No lifting greater than 15-20 lbs. May use ice or moist heat for pain. Weight loss is of benefit. Best medication for lumbar disc disease is arthritis medications like motrin, celebrex and naprosyn. Avoid the use of narcotic medication for back discomfort as it does nothing to cure pain and the body will develop a Tolerance or addiction with prolong use.  Exercise is important to improve your indurance and does allow people to function better inspite of back pain. Recommend weaning from brace 2 hours out of brace per day first week. Then 4 hours the second week and 6 hours per day the 3rd week then 8 hours per day the 4th week and then stop wearing the brace.  Or you can wear half days for 2 weeks and then stop use of the brace.

## 2020-12-03 ENCOUNTER — Ambulatory Visit (HOSPITAL_BASED_OUTPATIENT_CLINIC_OR_DEPARTMENT_OTHER)
Admission: RE | Admit: 2020-12-03 | Discharge: 2020-12-03 | Disposition: A | Discharge status: Home / Self Care | Payer: Self-pay | Source: Ambulatory Visit | Attending: Nurse Practitioner | Admitting: Nurse Practitioner

## 2020-12-03 DIAGNOSIS — Z13820 Encounter for screening for osteoporosis: Secondary | ICD-10-CM

## 2020-12-03 DIAGNOSIS — Z1231 Encounter for screening mammogram for malignant neoplasm of breast: Secondary | ICD-10-CM

## 2020-12-10 ENCOUNTER — Other Ambulatory Visit: Payer: Self-pay

## 2020-12-10 MED FILL — Metformin HCl Tab 1000 MG: ORAL | 30 days supply | Qty: 60 | Fill #0 | Status: AC

## 2020-12-10 MED FILL — Atorvastatin Calcium Tab 40 MG (Base Equivalent): ORAL | 30 days supply | Qty: 30 | Fill #0 | Status: AC

## 2020-12-10 NOTE — Progress Notes (Signed)
Your mammogram results show no evidence of breast cancer. We will plan to repeat this in 1 year for regularly scheduled screening.  If you should have concerns or changes in your breasts within the next year, please let us know.   SaraBeth

## 2020-12-10 NOTE — Progress Notes (Signed)
Bone density scan shows osteopenia, or low bone mass. This means that some of your bones are weaker than expected and this can lead to an increased risk of fractures (breaking bones) with time.   If you are not already taking them, I recommend Calcium 800mg  and Vitamin D3 supplement 1000iU daily to help prevent further bone loss.   I also recommend weight bearing exercises, such as walking, can also be helpful to prevent further bone loss and restore damage. I recommend 20 minutes of walking every day to help with this.   We will plan to repeat the scan in 3 years or sooner if needed.  SaraBeth

## 2020-12-16 ENCOUNTER — Telehealth (HOSPITAL_BASED_OUTPATIENT_CLINIC_OR_DEPARTMENT_OTHER): Payer: Self-pay

## 2020-12-16 NOTE — Telephone Encounter (Signed)
Called patient to discuss bone density and mammogram results using [employer] with operator [phone] Kyla Balzarine.  Patient is aware and agreeable to lab results and recommendations. Patient to agreeable to starting Calcium 800mg  and Vitamin D3 supplement 1000iU daily. Instructed patient to contact the office with questions or concerns.

## 2020-12-16 NOTE — Telephone Encounter (Signed)
-----   Message from Tollie Eth, NP sent at 12/10/2020  7:33 AM EDT ----- Bone density scan shows osteopenia, or low bone mass. This means that some of your bones are weaker than expected and this can lead to an increased risk of fractures (breaking bones) with time.   If you are not already taking them, I recommend Calcium 800mg  and Vitamin D3 supplement 1000iU daily to help prevent further bone loss.   I also recommend weight bearing exercises, such as walking, can also be helpful to prevent further bone loss and restore damage. I recommend 20 minutes of walking every day to help with this.   We will plan to repeat the scan in 3 years or sooner if needed.  SaraBeth

## 2021-03-04 ENCOUNTER — Other Ambulatory Visit: Payer: Self-pay

## 2021-03-04 ENCOUNTER — Encounter: Payer: Self-pay | Admitting: Specialist

## 2021-03-04 ENCOUNTER — Ambulatory Visit: Payer: Self-pay

## 2021-03-04 ENCOUNTER — Ambulatory Visit (INDEPENDENT_AMBULATORY_CARE_PROVIDER_SITE_OTHER): Payer: Self-pay | Admitting: Specialist

## 2021-03-04 VITALS — BP 137/59 | HR 82 | Ht 61.0 in | Wt 182.0 lb

## 2021-03-04 DIAGNOSIS — G629 Polyneuropathy, unspecified: Secondary | ICD-10-CM

## 2021-03-04 DIAGNOSIS — Z981 Arthrodesis status: Secondary | ICD-10-CM

## 2021-03-04 NOTE — Patient Instructions (Signed)
Avoid frequent bending and stooping  No lifting greater than 10 lbs. May use ice or moist heat for pain. Weight loss is of benefit. Best medication for lumbar disc disease is arthritis medications like motrin, celebrex and naprosyn. Exercise is important to improve your indurance and does allow people to function better inspite of back pain.   

## 2021-03-04 NOTE — Progress Notes (Signed)
Office Visit Note   Patient: Julia Knight           Date of Birth: [birth year]           MRN: [id number] Visit Date: 03/04/2021              Requested by: Tollie Eth, NP [address] [place],  [place] [address] PCP: Tollie Eth, NP   Assessment & Plan: Visit Diagnoses:  1. S/P lumbar fusion   2. Neuropathy     Plan: Avoid frequent bending and stooping  No lifting greater than 10 lbs. May use ice or moist heat for pain. Weight loss is of benefit. Best medication for lumbar disc disease is arthritis medications like motrin, celebrex and naprosyn. Exercise is important to improve your indurance and does allow people to function better inspite of back pain.  Follow-Up Instructions: Return in about 6 months (around 09/02/2021).   Orders:  Orders Placed This Encounter  Procedures   XR Lumbar Spine Complete W/Bend   No orders of the defined types were placed in this encounter.     Procedures: No procedures performed   Clinical Data: No additional findings.   Subjective: Chief Complaint  Patient presents with   Lower Back - Routine Post Op    HPI  Review of Systems   Objective: Vital Signs: BP (!) 137/59   Pulse 82   Ht 5\' 1"  (1.549 m)   Wt 182 lb (82.6 kg)   BMI 34.39 kg/m   Physical Exam  Ortho Exam  Specialty Comments:  No specialty comments available.  Imaging: No results found.   PMFS History: Patient Active Problem List   Diagnosis Date Noted   Postoperative anemia due to acute blood loss 09/03/2020    Priority: 1.    Class: Acute   Spondylolisthesis at L4-L5 level 09/01/2020    Priority: 1.    Class: Chronic   Pressure injury of sacral region, stage 2 (HCC) 09/18/2020   Spinal stenosis, 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   Status post lumbar spinal fusion 09/01/2020   Hyperlipidemia 07/24/2020   BMI 34.0-34.9,adult 05/22/2020   Spondylolisthesis at L3-L4 level 05/22/2020    Anxiety 06/07/2018   GERD (gastroesophageal reflux disease) 06/03/2013   Left-sided low back pain with sciatica 10/09/2012   Left leg pain 08/14/2012   Depression 03/08/2012   Essential hypertension 08/24/2011   T2DM (type 2 diabetes mellitus) (HCC) 02/28/2011   Past Medical History:  Diagnosis Date   Acute blood loss anemia 09/10/2020   Allergic rhinitis 08/24/2011   Allergy    Anxiety    Arthritis    back and hands   Constipation due to opioid therapy 09/18/2020   COVID-19 11/16/2018   Depression    Diabetes mellitus without complication (HCC)    type 2   Dizziness 08/16/2017   Encounter for medical examination to establish care 07/24/2020   Gastroenteritis, infectious, presumed 08/16/2017   GERD (gastroesophageal reflux disease)    Glossitis 09/10/2020   Hyperlipemia    Hypertension    Neuromuscular disorder (HCC)     History reviewed. No pertinent family history.  Past Surgical History:  Procedure Laterality Date   APPENDECTOMY     CHOLECYSTECTOMY     TONSILLECTOMY     removed as an adult   Social History   Occupational History   Not on file  Tobacco Use   Smoking status: Former    Types: Cigarettes   Smokeless tobacco: Never  Vaping Use   Vaping Use: Never used  Substance and Sexual Activity   Alcohol use: Never   Drug use: Never   Sexual activity: Not Currently

## 2021-03-15 ENCOUNTER — Other Ambulatory Visit: Payer: Self-pay

## 2021-03-15 ENCOUNTER — Emergency Department (HOSPITAL_COMMUNITY): Payer: Self-pay

## 2021-03-15 ENCOUNTER — Emergency Department (HOSPITAL_COMMUNITY)
Admission: EM | Admit: 2021-03-15 | Discharge: 2021-03-15 | Disposition: A | Discharge status: Home / Self Care | Payer: Self-pay | Attending: Emergency Medicine | Admitting: Emergency Medicine

## 2021-03-15 ENCOUNTER — Encounter (HOSPITAL_COMMUNITY): Payer: Self-pay

## 2021-03-15 DIAGNOSIS — Z7984 Long term (current) use of oral hypoglycemic drugs: Secondary | ICD-10-CM | POA: Insufficient documentation

## 2021-03-15 DIAGNOSIS — E1169 Type 2 diabetes mellitus with other specified complication: Secondary | ICD-10-CM | POA: Insufficient documentation

## 2021-03-15 DIAGNOSIS — J029 Acute pharyngitis, unspecified: Secondary | ICD-10-CM | POA: Insufficient documentation

## 2021-03-15 DIAGNOSIS — Z87891 Personal history of nicotine dependence: Secondary | ICD-10-CM | POA: Insufficient documentation

## 2021-03-15 DIAGNOSIS — Z20822 Contact with and (suspected) exposure to covid-19: Secondary | ICD-10-CM | POA: Insufficient documentation

## 2021-03-15 DIAGNOSIS — E785 Hyperlipidemia, unspecified: Secondary | ICD-10-CM | POA: Insufficient documentation

## 2021-03-15 DIAGNOSIS — I10 Essential (primary) hypertension: Secondary | ICD-10-CM | POA: Insufficient documentation

## 2021-03-15 DIAGNOSIS — R1013 Epigastric pain: Secondary | ICD-10-CM | POA: Insufficient documentation

## 2021-03-15 DIAGNOSIS — Z8616 Personal history of COVID-19: Secondary | ICD-10-CM | POA: Insufficient documentation

## 2021-03-15 LAB — CBC WITH DIFFERENTIAL/PLATELET
Abs Immature Granulocytes: 0.04 10*3/uL (ref 0.00–0.07)
Basophils Absolute: 0.1 10*3/uL (ref 0.0–0.1)
Basophils Relative: 1 %
Eosinophils Absolute: 0.3 10*3/uL (ref 0.0–0.5)
Eosinophils Relative: 2 %
HCT: 35.9 % — ABNORMAL LOW (ref 36.0–46.0)
Hemoglobin: 11.4 g/dL — ABNORMAL LOW (ref 12.0–15.0)
Immature Granulocytes: 0 %
Lymphocytes Relative: 23 %
Lymphs Abs: 3 10*3/uL (ref 0.7–4.0)
MCH: 27.7 pg (ref 26.0–34.0)
MCHC: 31.8 g/dL (ref 30.0–36.0)
MCV: 87.3 fL (ref 80.0–100.0)
Monocytes Absolute: 1.1 10*3/uL — ABNORMAL HIGH (ref 0.1–1.0)
Monocytes Relative: 8 %
Neutro Abs: 8.9 10*3/uL — ABNORMAL HIGH (ref 1.7–7.7)
Neutrophils Relative %: 66 %
Platelets: 312 10*3/uL (ref 150–400)
RBC: 4.11 MIL/uL (ref 3.87–5.11)
RDW: 13.9 % (ref 11.5–15.5)
WBC: 13.4 10*3/uL — ABNORMAL HIGH (ref 4.0–10.5)
nRBC: 0 % (ref 0.0–0.2)

## 2021-03-15 LAB — COMPREHENSIVE METABOLIC PANEL
ALT: 17 U/L (ref 0–44)
AST: 13 U/L — ABNORMAL LOW (ref 15–41)
Albumin: 3.8 g/dL (ref 3.5–5.0)
Alkaline Phosphatase: 160 U/L — ABNORMAL HIGH (ref 38–126)
Anion gap: 10 (ref 5–15)
BUN: 19 mg/dL (ref 8–23)
CO2: 20 mmol/L — ABNORMAL LOW (ref 22–32)
Calcium: 9.7 mg/dL (ref 8.9–10.3)
Chloride: 106 mmol/L (ref 98–111)
Creatinine, Ser: 1.06 mg/dL — ABNORMAL HIGH (ref 0.44–1.00)
GFR, Estimated: 57 mL/min — ABNORMAL LOW (ref >60)
Glucose, Bld: 178 mg/dL — ABNORMAL HIGH (ref 70–99)
Potassium: 5 mmol/L (ref 3.5–5.1)
Sodium: 136 mmol/L (ref 135–145)
Total Bilirubin: 0.8 mg/dL (ref 0.3–1.2)
Total Protein: 8.1 g/dL (ref 6.5–8.1)

## 2021-03-15 LAB — TROPONIN I (HIGH SENSITIVITY)
Troponin I (High Sensitivity): 9 ng/L (ref <18)
Troponin I (High Sensitivity): 5 ng/L (ref <18)

## 2021-03-15 LAB — RESP PANEL BY RT-PCR (FLU A&B, COVID) ARPGX2
Influenza A by PCR: NEGATIVE
Influenza B by PCR: NEGATIVE
SARS Coronavirus 2 by RT PCR: NEGATIVE

## 2021-03-15 LAB — LIPASE, BLOOD: Lipase: 29 U/L (ref 11–51)

## 2021-03-15 MED ORDER — AZITHROMYCIN 250 MG PO TABS: 250.0000 mg | ORAL_TABLET | Freq: Every day | ORAL | 0 refills | Status: DC

## 2021-03-15 NOTE — ED Provider Notes (Signed)
Emergency Medicine Provider Triage Evaluation Note  Julia Knight , a 70 y.o. female  was evaluated in triage.  Pt complains of epigastric pain and throat pain and pain with swallowing.  Daughter is at bedside and helps to provide history, reports that for years her mom has dealt with epigastric pain but over the past week she started complaining of a lot of burning pain in her throat and pain with swallowing she is still able to swallow but it is very uncomfortable.  She is also had some intermittent cough and congestion.  She denies associated pain in her chest just pain in her throat and epigastric region.  No shortness of breath.  She continues to have worsening epigastric pain that is worse after eating.  Review of Systems  Positive: Sore throat, pain with swallowing, epigastric pain Negative: Fever, vomiting, chest pain, shortness of breath  Physical Exam  BP (!) 153/69 (BP Location: Right Arm)   Pulse 99   Temp 98.2 F (36.8 C) (Oral)   Resp 15   SpO2 96%  Gen:   Awake, no distress   Resp:  Normal effort  MSK:   Moves extremities without difficulty  Other:  Posterior oropharynx is clear without erythema or swelling, tenderness in the epigastric region and across the upper abdomen  Medical Decision Making  Medically screening exam initiated at 11:12 AM.  Appropriate orders placed.  [name] was informed that the remainder of the evaluation will be completed by another provider, this initial triage assessment does not replace that evaluation, and the importance of remaining in the ED until their evaluation is complete.     Dartha Lodge, PA-C 03/15/21 1114    Mancel Bale, MD 03/15/21 2105

## 2021-03-15 NOTE — ED Provider Notes (Signed)
[facility] Provider Note   CSN: [id number] Arrival date & time: 03/15/21  1012     History No chief complaint on file.   Julia Knight is a 70 y.o. female.  Patient denies any flulike symptoms.  But has a complaint of sore throat that is been ongoing for 5 days burning when swallowing.  Denies any fevers cough or congestion.  Has not had anything like this before.  Also a patient with a complaint of epigastric abdominal pain that is actually being evaluated by primary care provider this been ongoing for years.  Nothing new or worse.  No nausea vomiting or diarrhea.  Past medical history significant for hyperlipidemia and hypertension diabetes without complications.      Past Medical History:  Diagnosis Date   Acute blood loss anemia 09/10/2020   Allergic rhinitis 08/24/2011   Allergy    Anxiety    Arthritis    back and hands   Constipation due to opioid therapy 09/18/2020   COVID-19 11/16/2018   Depression    Diabetes mellitus without complication ([place])    type 2   Dizziness 08/16/2017   Encounter for medical examination to establish care 07/24/2020   Gastroenteritis, infectious, presumed 08/16/2017   GERD (gastroesophageal reflux disease)    Glossitis 09/10/2020   Hyperlipemia    Hypertension    Neuromuscular disorder [facility])     Patient Active Problem List   Diagnosis Date Noted   Pressure injury of sacral region, stage 2 ([place]) 09/18/2020   Postoperative anemia due to acute blood loss 09/03/2020    Class: Acute   Spinal stenosis, 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   Spondylolisthesis at L4-L5 level 09/01/2020    Class: Chronic   Status post lumbar spinal fusion 09/01/2020   Hyperlipidemia 07/24/2020   BMI 34.0-34.9,adult 05/22/2020   Spondylolisthesis at L3-L4 level 05/22/2020   Anxiety 06/07/2018   GERD (gastroesophageal reflux disease) 06/03/2013   Left-sided low back pain with sciatica 10/09/2012    Left leg pain 08/14/2012   Depression 03/08/2012   Essential hypertension 08/24/2011   T2DM (type 2 diabetes mellitus) ([place]) 02/28/2011    Past Surgical History:  Procedure Laterality Date   APPENDECTOMY     CHOLECYSTECTOMY     TONSILLECTOMY     removed as an adult     OB History   No obstetric history on file.     No family history on file.  Social History   Tobacco Use   Smoking status: Former    Types: Cigarettes   Smokeless tobacco: Never  Vaping Use   Vaping Use: Never used  Substance Use Topics   Alcohol use: Never   Drug use: Never    Home Medications Prior to Admission medications   Medication Sig Start Date End Date Taking? Authorizing Provider  azithromycin (ZITHROMAX) 250 MG tablet Take 1 tablet (250 mg total) by mouth daily. Take first 2 tablets together, then 1 every day until finished. 03/15/21  Yes Fredia Sorrow, MD  atorvastatin (LIPITOR) 40 MG tablet TAKE 1 TABLET (40MG) BY MOUTH EVERY NIGHT AT BEDTIME. Patient taking differently: Take 40 mg by mouth at bedtime. 07/24/20 07/24/21  Orma Render, NP  calcium carbonate (OSCAL) 1500 (600 Ca) MG TABS tablet Take 600 mg of elemental calcium by mouth in the morning.    [provider]  docusate sodium (COLACE) 100 MG capsule Take 1 capsule (100 mg total) by mouth 2 (two) times daily. 09/04/20  Jessy Oto, MD  ferrous sulfate 325 (65 FE) MG EC tablet Take 1 tablet by mouth every Monday, Wednesday, and Friday. For low iron. 09/18/20   Orma Render, NP  gabapentin (NEURONTIN) 300 MG capsule Take one tablet (34m)  by mouth every 8 hours as needed for pain. Tome una tableta por oral cada 8 horas segun sea necesario para el dolor. 10/26/20   EOrma Render NP  HYDROcodone-acetaminophen (NORCO/VICODIN) 5-325 MG tablet Take 1 tablet by mouth every 8 (eight) hours. All future prescriptions must come from surgery. Tome una tableta por via oral cada 8 horas segun sea necesario para el dolor. Cualquier  receta futura debe provenir [place] 10/26/20   EOrma Render NP  metFORMIN (GLUCOPHAGE) 1000 MG tablet TAKE 1 TABLET (1000MG) BY MOUTH EVERY MORNING AND 1 TABLET (1000MG) BY MOUTH EVERY EVENING WITH A MEAL. 07/24/20 07/24/21  EOrma Render NP  Multiple Vitamin (MULTIVITAMIN WITH MINERALS) TABS tablet Take 1 tablet by mouth in the morning.    [provider]  omega-3 acid ethyl esters (LOVAZA) 1 g capsule Take 1 g by mouth daily.    [provider]  pantoprazole (PROTONIX) 40 MG tablet Take 1 tablet (40 mg total) by mouth daily. 09/18/20 09/18/21  EOrma Render NP  polyethylene glycol (MIRALAX / GLYCOLAX) 17 g packet Take 17 g by mouth 2 (two) times daily. Take twice a day every day while on pain medications. Then may take as needed. 09/18/20   EOrma Render NP    Allergies    Patient has no known allergies.  Review of Systems   Review of Systems  Constitutional:  Negative for chills and fever.  HENT:  Positive for sore throat. Negative for ear pain and trouble swallowing.   Eyes:  Negative for pain and visual disturbance.  Respiratory:  Negative for cough and shortness of breath.   Cardiovascular:  Negative for chest pain and palpitations.  Gastrointestinal:  Positive for abdominal pain. Negative for vomiting.  Genitourinary:  Negative for dysuria and hematuria.  Musculoskeletal:  Negative for arthralgias and back pain.  Skin:  Negative for color change and rash.  Neurological:  Negative for seizures and syncope.  All other systems reviewed and are negative.  Physical Exam Updated Vital Signs BP (!) 145/66 (BP Location: Right Arm)   Pulse 93   Temp 98.2 F (36.8 C)   Resp 16   SpO2 97%   Physical Exam Vitals and nursing note reviewed.  Constitutional:      General: She is not in acute distress.    Appearance: She is well-developed.  HENT:     Head: Normocephalic and atraumatic.     Mouth/Throat:     Mouth: Mucous membranes are moist.     Pharynx:  Oropharynx is clear. Posterior oropharyngeal erythema present. No oropharyngeal exudate.     Comments: Uvula midline.  No exudate tonsils not enlarged.  Slight erythema to the posterior pharynx Eyes:     Conjunctiva/sclera: Conjunctivae normal.  Cardiovascular:     Rate and Rhythm: Normal rate and regular rhythm.     Heart sounds: No murmur heard. Pulmonary:     Effort: Pulmonary effort is normal. No respiratory distress.     Breath sounds: Normal breath sounds.  Abdominal:     General: There is no distension.     Palpations: Abdomen is soft.     Tenderness: There is no abdominal tenderness. There is no guarding.  Musculoskeletal:  General: Normal range of motion.     Cervical back: Neck supple.  Lymphadenopathy:     Cervical: No cervical adenopathy.  Skin:    General: Skin is warm and dry.     Capillary Refill: Capillary refill takes less than 2 seconds.  Neurological:     General: No focal deficit present.     Mental Status: She is alert and oriented to person, place, and time.    ED Results / Procedures / Treatments   Labs (all labs ordered are listed, but only abnormal results are displayed) Labs Reviewed  COMPREHENSIVE METABOLIC PANEL - Abnormal; Notable for the following components:      Result Value   CO2 20 (*)    Glucose, Bld 178 (*)    Creatinine, Ser 1.06 (*)    AST 13 (*)    Alkaline Phosphatase 160 (*)    GFR, Estimated 57 (*)    All other components within normal limits  CBC WITH DIFFERENTIAL/PLATELET - Abnormal; Notable for the following components:   WBC 13.4 (*)    Hemoglobin 11.4 (*)    HCT 35.9 (*)    Neutro Abs 8.9 (*)    Monocytes Absolute 1.1 (*)    All other components within normal limits  RESP PANEL BY RT-PCR (FLU A&B, COVID) ARPGX2  LIPASE, BLOOD  TROPONIN I (HIGH SENSITIVITY)  TROPONIN I (HIGH SENSITIVITY)    EKG EKG Interpretation  Date/Time:  Monday March 15 2021 19:34:34 EST Ventricular Rate:  86 PR Interval:  144 QRS  Duration: 84 QT Interval:  350 QTC Calculation: 418 R Axis:   68 Text Interpretation: Normal sinus rhythm Normal ECG Confirmed by Fredia Sorrow [phone]) on 03/15/2021 9:01:05 PM  Radiology DG Chest 2 View  Result Date: 03/15/2021 CLINICAL DATA:  Cough EXAM: CHEST - 2 VIEW COMPARISON:  08/28/2020 FINDINGS: Cardiac size is within normal limits. There are no signs of pulmonary edema or focal pulmonary consolidation. There is no significant pleural effusion or pneumothorax. Cardiophrenic angles are indistinct, possibly due to pleural adhesions. IMPRESSION: There are no new infiltrates or signs of pulmonary edema. Electronically Signed   By: Elmer Picker M.D.   On: 03/15/2021 12:49    Procedures Procedures   Medications Ordered in ED Medications - No data to display  ED Course  I have reviewed the triage vital signs and the nursing notes.  Pertinent labs & imaging results that were available during my care of the patient were reviewed by me and considered in my medical decision making (see chart for details).    MDM Rules/Calculators/A&P                           Patient with some slight erythema to the sore throat.  Patient really wants antibiotics.  Patient without cold or flu symptoms.  This is ongoing for 5 days so we will give a course of azithromycin and have her follow back up with her primary care doctor.  Primary care doctor is already addressing the epigastric abdominal pain that is been present for a long period of time.  Labs here today without significant abnormalities.  Lipase is normal alk phos is elevated but total bili is normal.  Mild leukocytosis with a white count of 13.4.  Hemoglobin 11.4.  Troponins x2 are normal.  GFR 57.  Patient stable for discharge and follow-up with primary care provider Final Clinical Impression(s) / ED Diagnoses Final diagnoses:  Sore throat  Rx / DC Orders ED Discharge Orders          Ordered    azithromycin (ZITHROMAX) 250 MG  tablet  Daily        03/15/21 2318             Fredia Sorrow, MD 03/15/21 2322

## 2021-03-15 NOTE — ED Triage Notes (Signed)
Patient complains of years of GI pain and this week has had throat burning, no difficulty swallowing. Patient alert and oriented, NAD

## 2021-03-15 NOTE — ED Notes (Signed)
Pt reports burning when swallowing x 5 days which radiates to back of neck. Pt also reports epigastric pain x "a long time". Pt resting on stretcher in NAD.

## 2021-03-15 NOTE — Discharge Instructions (Addendum)
Take the azithromycin as directed.  Prescription provided.  Sore throat may be viral in nature.  But try this.  Make an appointment to follow-up with your primary care provider.  Also talk to them about the epigastric abdominal pain this been ongoing for a long period of time.  Have them continue their follow-up of that.  Return for any new or worse symptoms

## 2021-03-16 ENCOUNTER — Ambulatory Visit (HOSPITAL_BASED_OUTPATIENT_CLINIC_OR_DEPARTMENT_OTHER): Payer: Self-pay | Admitting: Nurse Practitioner

## 2021-03-17 ENCOUNTER — Ambulatory Visit (HOSPITAL_BASED_OUTPATIENT_CLINIC_OR_DEPARTMENT_OTHER): Payer: Self-pay | Admitting: Nurse Practitioner

## 2021-03-18 ENCOUNTER — Other Ambulatory Visit (HOSPITAL_COMMUNITY): Payer: Self-pay

## 2021-03-18 ENCOUNTER — Other Ambulatory Visit: Payer: Self-pay

## 2021-03-18 ENCOUNTER — Encounter (HOSPITAL_BASED_OUTPATIENT_CLINIC_OR_DEPARTMENT_OTHER): Payer: Self-pay | Admitting: Nurse Practitioner

## 2021-03-18 ENCOUNTER — Ambulatory Visit (INDEPENDENT_AMBULATORY_CARE_PROVIDER_SITE_OTHER): Payer: Self-pay | Admitting: Nurse Practitioner

## 2021-03-18 VITALS — BP 122/82 | HR 62 | Ht 61.0 in | Wt 182.6 lb

## 2021-03-18 DIAGNOSIS — R1010 Upper abdominal pain, unspecified: Secondary | ICD-10-CM

## 2021-03-18 DIAGNOSIS — J40 Bronchitis, not specified as acute or chronic: Secondary | ICD-10-CM

## 2021-03-18 DIAGNOSIS — E1165 Type 2 diabetes mellitus with hyperglycemia: Secondary | ICD-10-CM

## 2021-03-18 DIAGNOSIS — K920 Hematemesis: Secondary | ICD-10-CM

## 2021-03-18 DIAGNOSIS — E782 Mixed hyperlipidemia: Secondary | ICD-10-CM

## 2021-03-18 DIAGNOSIS — J029 Acute pharyngitis, unspecified: Secondary | ICD-10-CM

## 2021-03-18 DIAGNOSIS — R110 Nausea: Secondary | ICD-10-CM

## 2021-03-18 DIAGNOSIS — J329 Chronic sinusitis, unspecified: Secondary | ICD-10-CM

## 2021-03-18 DIAGNOSIS — I10 Essential (primary) hypertension: Secondary | ICD-10-CM

## 2021-03-18 MED ORDER — SUCRALFATE 1 G PO TABS
1.0000 g | ORAL_TABLET | Freq: Four times a day (QID) | ORAL | 0 refills | Status: DC
  Filled 2021-03-18: qty 120, 30d supply, fill #0

## 2021-03-18 MED ORDER — AMOXICILLIN-POT CLAVULANATE 875-125 MG PO TABS
1.0000 | ORAL_TABLET | Freq: Two times a day (BID) | ORAL | 0 refills | Status: DC
  Filled 2021-03-18: qty 10, 5d supply, fill #0

## 2021-03-18 MED ORDER — METFORMIN HCL 1000 MG PO TABS
1000.0000 mg | ORAL_TABLET | Freq: Two times a day (BID) | ORAL | 11 refills | Status: DC
  Filled 2021-03-18 – 2021-10-22 (×2): qty 60, 30d supply, fill #0

## 2021-03-18 MED ORDER — ATORVASTATIN CALCIUM 40 MG PO TABS
40.0000 mg | ORAL_TABLET | Freq: Every day | ORAL | 11 refills | Status: DC
  Filled 2021-03-18 – 2021-10-22 (×2): qty 30, 30d supply, fill #0

## 2021-03-18 MED ORDER — PANTOPRAZOLE SODIUM 40 MG PO TBEC
40.0000 mg | DELAYED_RELEASE_TABLET | Freq: Two times a day (BID) | ORAL | 3 refills | Status: DC
Start: 2021-03-18 — End: 2021-03-18
  Filled 2021-03-18: qty 60, 30d supply, fill #0

## 2021-03-18 MED ORDER — METFORMIN HCL 1000 MG PO TABS
1000.0000 mg | ORAL_TABLET | Freq: Two times a day (BID) | ORAL | 11 refills | Status: DC
  Filled 2021-03-18 (×2): qty 60, 30d supply, fill #0

## 2021-03-18 MED ORDER — PANTOPRAZOLE SODIUM 40 MG PO TBEC
40.0000 mg | DELAYED_RELEASE_TABLET | Freq: Two times a day (BID) | ORAL | 3 refills | Status: DC
  Filled 2021-03-18 (×2): qty 60, 30d supply, fill #0

## 2021-03-18 MED ORDER — ATORVASTATIN CALCIUM 40 MG PO TABS
40.0000 mg | ORAL_TABLET | Freq: Every day | ORAL | 11 refills | Status: DC
  Filled 2021-03-18 (×2): qty 30, 30d supply, fill #0

## 2021-03-18 MED ORDER — LISINOPRIL 20 MG PO TABS
20.0000 mg | ORAL_TABLET | Freq: Every day | ORAL | 11 refills | Status: DC
  Filled 2021-03-18 (×2): qty 30, 30d supply, fill #0

## 2021-03-18 MED ORDER — PANTOPRAZOLE SODIUM 40 MG PO TBEC
40.0000 mg | DELAYED_RELEASE_TABLET | Freq: Two times a day (BID) | ORAL | 3 refills | Status: DC
  Filled 2021-03-18: qty 60, 30d supply, fill #0

## 2021-03-18 MED ORDER — AMOXICILLIN-POT CLAVULANATE 875-125 MG PO TABS
1.0000 | ORAL_TABLET | Freq: Two times a day (BID) | ORAL | 0 refills | Status: DC
  Filled 2021-03-18 (×2): qty 10, 5d supply, fill #0

## 2021-03-18 MED ORDER — SUCRALFATE 1 G PO TABS
1.0000 g | ORAL_TABLET | Freq: Four times a day (QID) | ORAL | 0 refills | Status: DC
  Filled 2021-03-18 (×2): qty 120, 30d supply, fill #0

## 2021-03-18 MED ORDER — LISINOPRIL 20 MG PO TABS
20.0000 mg | ORAL_TABLET | Freq: Every day | ORAL | 11 refills | Status: DC
  Filled 2021-03-18: qty 30, 30d supply, fill #0

## 2021-03-18 NOTE — Progress Notes (Signed)
Established Patient Office Visit  Subjective:  Patient ID: Julia Knight, female    DOB: 1950/06/11  Age: 70 y.o. MRN: [id number]  CC:  Chief Complaint  Patient presents with   Follow-up    Patient seen in ED for abdominal pain and sore throat, recommend and U/S not completed.prescribed Azithromycin 250mg  short course which relieved sore throat not abdominal pain.    Abdominal Pain    Abdominal U/S for gallbladder recommened not completed. Pt not established with GI may need referral. Patient stated abdominal pain still present even after antibiotics.     HPI Julia Knight presents for follow-up from recent emergency room visit for abdominal pain.  She is also experiencing a severe sore throat. She presents today with an interpreter and her daughter.   She presented to the emergency room on 03/15/2021 for 5 days of severe sore throat and burning with swallowing.  She had no other associated symptoms with exception of epigastric abdominal pain that has been ongoing for several years. She was treated with azithromycin short course which has helped the sore throat some but she endorses that her throat is still quite sore.  She also has a mild nonproductive cough which is worse at night.  She has 1 more dose of the antibiotic at this time.  She does endorse that she has began to experience some chills and suspected fever.  She has no body aches.  She does have some congestion.  Her abdominal pain is primarily in the upper abdomen diffuse from left to right.  She has not had imaging of this in the past.  She has not seen GI for this. She does endorse intermittent nausea and in the past has had hematemesis with streaking of bright red blood but nothing recently.  No diarrhea.  The pain is constant.  It is not relieved with food or fasting. She does not take chronic NSAIDs.  She has been prescribed pantoprazole which was helpful but she does not have this anymore.   No  Known Allergies  ROS Review of Systems All review of systems negative except what is listed in the HPI    Objective:    Physical Exam Vitals and nursing note reviewed.  Constitutional:      Appearance: She is well-developed. She is obese. She is ill-appearing.  HENT:     Head: Normocephalic.     Mouth/Throat:     Pharynx: Pharyngeal swelling present.     Comments: Erythematous posterior oropharynx present.  No exudate.  Mild swelling is noted with no obstruction. Eyes:     Extraocular Movements: Extraocular movements intact.     Pupils: Pupils are equal, round, and reactive to light.  Cardiovascular:     Rate and Rhythm: Normal rate and regular rhythm.     Heart sounds: No murmur heard. Pulmonary:     Effort: Pulmonary effort is normal. No respiratory distress.     Breath sounds: Wheezing present.     Comments: Bilateral wheezing present worse on the left upper lobe. Abdominal:     General: Abdomen is flat. Bowel sounds are normal.     Palpations: Abdomen is soft. There is no shifting dullness, fluid wave, hepatomegaly, splenomegaly, mass or pulsatile mass.     Tenderness: There is abdominal tenderness in the epigastric area. There is guarding. There is no right CVA tenderness, left CVA tenderness or rebound.     Hernia: No hernia is present.  Skin:    General: Skin is  warm and dry.     Capillary Refill: Capillary refill takes less than 2 seconds.  Neurological:     General: No focal deficit present.     Mental Status: She is alert and oriented to person, place, and time.  Psychiatric:        Mood and Affect: Mood normal.        Behavior: Behavior normal.    BP 122/82   Pulse 62   Ht 5\' 1"  (1.549 m)   Wt 182 lb 9.6 oz (82.8 kg)   SpO2 97%   BMI 34.50 kg/m  Wt Readings from Last 3 Encounters:  03/18/21 182 lb 9.6 oz (82.8 kg)  03/04/21 182 lb (82.6 kg)  12/02/20 182 lb 3.2 oz (82.6 kg)     Assessment & Plan:   Problem List Items Addressed This Visit      Primary hypertension    Blood pressure is well controlled today at 122/82. [facility] as the patient does not have insurance coverage. Blood pressure goal less than 140/90.  We will follow.      Relevant Medications   atorvastatin (LIPITOR) 40 MG tablet   lisinopril (ZESTRIL) 20 MG tablet   Hyperlipidemia    Refilled medications provided today.  No change in plan of care at this time.      Relevant Medications   atorvastatin (LIPITOR) 40 MG tablet   lisinopril (ZESTRIL) 20 MG tablet   T2DM (type 2 diabetes mellitus) ([facility])    [facility]ill on [facility] No changes to plan of care at this time.      Relevant Medications   atorvastatin (LIPITOR) 40 MG tablet   lisinopril (ZESTRIL) 20 MG tablet   metFORMIN (GLUCOPHAGE) 1000 MG tablet   Pain of upper abdomen - Primary    Generalized upper abdominal pain with palpated tenderness on both right and left and epigastric region. Intermittent nausea with bright red blood streaking and emesis.  No large-volume hematemesis, no black/tarry stools reported by patient. Vital signs are stable and no alarm symptoms present at this time. Will send referral to GI for possible upper endoscopy and further evaluation. Will get ultrasound today for further evaluation. Start pantoprazole 40 mg twice a day and Carafate to see if this is helpful for symptoms.   Avoid spicy foods, carbonation, and foods with high acidic content.  Recommend bland diet. Patient will return if hematemesis returns or new symptoms develop. Plan to follow-up in 1 to 2 weeks for evaluation.      Relevant Medications   sucralfate (CARAFATE) 1 g tablet   pantoprazole (PROTONIX) 40 MG tablet   Other Relevant Orders   Ambulatory referral to Gastroenterology   12/04/20 Abdomen Complete   Nausea    Nausea associated with upper abdominal pain and intermittent  bright red blood streaking in emesis. Suspect possible gastric ulceration is the cause of her current symptoms. She is not having any alarm symptoms present today and no factors present for emergent imaging. I do recommend ultrasound of the upper abdomen for evaluation and will send referral to GI. Will increase pantoprazole to 40 mg twice a day and add Carafate. We will plan to follow-up in 1 to 2 weeks to see if this is caused any improvement.      Relevant Medications   sucralfate (CARAFATE) 1 g tablet   pantoprazole (PROTONIX) 40 MG tablet   Other  Relevant Orders   Ambulatory referral to Gastroenterology   US Abdomen Complete   Hematemesis with nausea    Intermittent nausea associated with epigastric abdominal pain.  She has had bright red blood streaking in emesis in the past- none in the past 3 days. It does not appear that she has had any imaging or upper endoscopy to evaluate this further. Recommend abdominal ultrasound for evaluation and referral to GI for further investigation. Patient does not have insurance therefore financial limitations due to hinder some options for evaluation however given the presence of symptoms and the length of time they have been present I do feel that thorough evaluation is required. Suspect possible gastric ulcer is contributing.  Will increase pantoprazole to twice a day and add Carafate to see if this helps with symptoms while we continue further investigation.      Relevant Medications   sucralfate (CARAFATE) 1 g tablet   pantoprazole (PROTONIX) 40 MG tablet   Other Relevant Orders   Ambulatory referral to Gastroenterology   US Abdomen Complete   Sore throat    Sore throat in the setting of suspected bacterial sinusitis infection and upper abdominal pain with suspected reflux and possible gastric ulceration. Antibiotic treatment has elicited some improvement however not complete. Will change antibiotic therapy to Augmentin and add pantoprazole  twice a day in addition to Carafate for reflux/ulceration symptoms. Recommend avoidance of spicy foods, high acidic foods, caffeine, and carbonation.  Bland diet is best.  Recommend small meals and avoidance of overeating.  Prop head of bed up to prevent reflux at night. Referral to GI sent and abdominal ultrasound ordered today for further evaluation. Follow-up in 1 to 2 weeks or sooner if needed.      Relevant Medications   sucralfate (CARAFATE) 1 g tablet   pantoprazole (PROTONIX) 40 MG tablet   Sinobronchitis    Congestion and sinus tenderness present.  Bilateral wheezing worse on the left upper lobe.  Erythematous posterior oropharynx present without exudate.  Mild swelling with no oropharyngeal obstruction noted. Suspect continuing bacterial infection may be present contributing to current symptoms. Will change antibiotic to Augmentin to see if we can get improved coverage. Recommend follow-up in 5 days if no symptom improvement.      Relevant Medications   amoxicillin-clavulanate (AUGMENTIN) 875-125 MG tablet    Meds ordered this encounter  Medications   DISCONTD: pantoprazole (PROTONIX) 40 MG tablet    Sig: Take 1 tablet (40 mg total) by mouth 2 (two) times daily.    Dispense:  60 tablet    Refill:  3   DISCONTD: sucralfate (CARAFATE) 1 g tablet    Sig: Take 1 tablet (1 g total) by mouth 4 (four) times daily.    Dispense:  120 tablet    Refill:  0   DISCONTD: amoxicillin-clavulanate (AUGMENTIN) 875-125 MG tablet    Sig: Take 1 tablet by mouth 2 (two) times daily.    Dispense:  10 tablet    Refill:  0   DISCONTD: amoxicillin-clavulanate (AUGMENTIN) 875-125 MG tablet    Sig: Take 1 tablet by mouth 2 (two) times daily.    Dispense:  10 tablet    Refill:  0   DISCONTD: pantoprazole (PROTONIX) 40 MG tablet    Sig: Take 1 tablet (40 mg total) by mouth 2 (two) times daily.    Dispense:  60 tablet    Refill:  3   DISCONTD: sucralfate (CARAFATE) 1 g tablet    Sig: Take 1  tablet (1 g total) by mouth 4 (four) times daily.    Dispense:  120 tablet    Refill:  0   DISCONTD: atorvastatin (LIPITOR) 40 MG tablet    Sig: Take 1 tablet (40 mg total) by mouth at bedtime.    Dispense:  30 tablet    Refill:  11   DISCONTD: metFORMIN (GLUCOPHAGE) 1000 MG tablet    Sig: Take 1 tablet (1,000 mg total) by mouth 2 (two) times daily; in the morning and evening with a meal    Dispense:  60 tablet    Refill:  11   DISCONTD: lisinopril (ZESTRIL) 20 MG tablet    Sig: Take 1 tablet (20 mg total) by mouth daily.    Dispense:  30 tablet    Refill:  11   amoxicillin-clavulanate (AUGMENTIN) 875-125 MG tablet    Sig: Take 1 tablet by mouth 2 (two) times daily.    Dispense:  10 tablet    Refill:  0   atorvastatin (LIPITOR) 40 MG tablet    Sig: Take 1 tablet (40 mg total) by mouth at bedtime.    Dispense:  30 tablet    Refill:  11   lisinopril (ZESTRIL) 20 MG tablet    Sig: Take 1 tablet (20 mg total) by mouth daily.    Dispense:  30 tablet    Refill:  11   metFORMIN (GLUCOPHAGE) 1000 MG tablet    Sig: Take 1 tablet (1,000 mg total) by mouth 2 (two) times daily; in the morning and evening with a meal    Dispense:  60 tablet    Refill:  11   sucralfate (CARAFATE) 1 g tablet    Sig: Take 1 tablet (1 g total) by mouth 4 (four) times daily.    Dispense:  120 tablet    Refill:  0   pantoprazole (PROTONIX) 40 MG tablet    Sig: Take 1 tablet (40 mg total) by mouth 2 (two) times daily.    Dispense:  60 tablet    Refill:  3    Follow-up: Return for 2 weeks or sooner based on imaging.    Tollie Eth, NP

## 2021-03-18 NOTE — Patient Instructions (Addendum)
I have sent the medications to [facility].  [facility] Located inside [facility] Address: [address], [place], [place] [address] Hours: 8am -5:30pm Monday through Friday They will call to schedule the ultrasound  I am also going to send a referral to the stomach doctor to call you. If we need to cancel this, we can later, but I want to get this started.

## 2021-03-19 DIAGNOSIS — J329 Chronic sinusitis, unspecified: Secondary | ICD-10-CM | POA: Insufficient documentation

## 2021-03-19 DIAGNOSIS — K920 Hematemesis: Secondary | ICD-10-CM | POA: Insufficient documentation

## 2021-03-19 DIAGNOSIS — J029 Acute pharyngitis, unspecified: Secondary | ICD-10-CM | POA: Insufficient documentation

## 2021-03-19 DIAGNOSIS — R110 Nausea: Secondary | ICD-10-CM | POA: Insufficient documentation

## 2021-03-19 DIAGNOSIS — R1010 Upper abdominal pain, unspecified: Secondary | ICD-10-CM | POA: Insufficient documentation

## 2021-03-19 NOTE — Assessment & Plan Note (Signed)
Intermittent nausea associated with epigastric abdominal pain.  She has had bright red blood streaking in emesis in the past- none in the past 3 days. It does not appear that she has had any imaging or upper endoscopy to evaluate this further. Recommend abdominal ultrasound for evaluation and referral to GI for further investigation. Patient does not have insurance therefore financial limitations due to hinder some options for evaluation however given the presence of symptoms and the length of time they have been present I do feel that thorough evaluation is required. Suspect possible gastric ulcer is contributing.  Will increase pantoprazole to twice a day and add Carafate to see if this helps with symptoms while we continue further investigation.

## 2021-03-19 NOTE — Assessment & Plan Note (Signed)
[facility] No changes to plan of care at this time.

## 2021-03-19 NOTE — Assessment & Plan Note (Signed)
Nausea associated with upper abdominal pain and intermittent bright red blood streaking in emesis. Suspect possible gastric ulceration is the cause of her current symptoms. She is not having any alarm symptoms present today and no factors present for emergent imaging. I do recommend ultrasound of the upper abdomen for evaluation and will send referral to GI. Will increase pantoprazole to 40 mg twice a day and add Carafate. We will plan to follow-up in 1 to 2 weeks to see if this is caused any improvement.

## 2021-03-19 NOTE — Assessment & Plan Note (Signed)
Blood pressure is well controlled today at 122/82. Will [facility] for [facility] as the patient does not have insurance coverage. Blood pressure goal less than 140/90.  We will follow.

## 2021-03-19 NOTE — Assessment & Plan Note (Signed)
Generalized upper abdominal pain with palpated tenderness on both right and left and epigastric region. Intermittent nausea with bright red blood streaking and emesis.  No large-volume hematemesis, no black/tarry stools reported by patient. Vital signs are stable and no alarm symptoms present at this time. Will send referral to GI for possible upper endoscopy and further evaluation. Will get ultrasound today for further evaluation. Start pantoprazole 40 mg twice a day and Carafate to see if this is helpful for symptoms.   Avoid spicy foods, carbonation, and foods with high acidic content.  Recommend bland diet. Patient will return if hematemesis returns or new symptoms develop. Plan to follow-up in 1 to 2 weeks for evaluation.

## 2021-03-19 NOTE — Assessment & Plan Note (Signed)
Refilled medications provided today.  No change in plan of care at this time.

## 2021-03-19 NOTE — Assessment & Plan Note (Signed)
Congestion and sinus tenderness present.  Bilateral wheezing worse on the left upper lobe.  Erythematous posterior oropharynx present without exudate.  Mild swelling with no oropharyngeal obstruction noted. Suspect continuing bacterial infection may be present contributing to current symptoms. Will change antibiotic to Augmentin to see if we can get improved coverage. Recommend follow-up in 5 days if no symptom improvement.

## 2021-03-19 NOTE — Assessment & Plan Note (Signed)
Sore throat in the setting of suspected bacterial sinusitis infection and upper abdominal pain with suspected reflux and possible gastric ulceration. Antibiotic treatment has elicited some improvement however not complete. Will change antibiotic therapy to Augmentin and add pantoprazole twice a day in addition to Carafate for reflux/ulceration symptoms. Recommend avoidance of spicy foods, high acidic foods, caffeine, and carbonation.  Bland diet is best.  Recommend small meals and avoidance of overeating.  Prop head of bed up to prevent reflux at night. Referral to GI sent and abdominal ultrasound ordered today for further evaluation. Follow-up in 1 to 2 weeks or sooner if needed.

## 2021-03-25 ENCOUNTER — Ambulatory Visit
Admission: RE | Admit: 2021-03-25 | Discharge: 2021-03-25 | Disposition: A | Discharge status: Home / Self Care | Payer: Self-pay | Source: Ambulatory Visit | Attending: Nurse Practitioner | Admitting: Nurse Practitioner

## 2021-03-29 ENCOUNTER — Other Ambulatory Visit: Payer: Self-pay

## 2021-03-29 ENCOUNTER — Ambulatory Visit (INDEPENDENT_AMBULATORY_CARE_PROVIDER_SITE_OTHER): Payer: Self-pay | Admitting: Nurse Practitioner

## 2021-03-29 ENCOUNTER — Encounter (HOSPITAL_BASED_OUTPATIENT_CLINIC_OR_DEPARTMENT_OTHER): Payer: Self-pay | Admitting: Nurse Practitioner

## 2021-03-29 VITALS — BP 119/72 | HR 72 | Ht 60.0 in | Wt 184.8 lb

## 2021-03-29 DIAGNOSIS — Z6834 Body mass index (BMI) 34.0-34.9, adult: Secondary | ICD-10-CM

## 2021-03-29 DIAGNOSIS — R110 Nausea: Secondary | ICD-10-CM

## 2021-03-29 DIAGNOSIS — E782 Mixed hyperlipidemia: Secondary | ICD-10-CM

## 2021-03-29 DIAGNOSIS — K760 Fatty (change of) liver, not elsewhere classified: Secondary | ICD-10-CM | POA: Insufficient documentation

## 2021-03-29 DIAGNOSIS — E1165 Type 2 diabetes mellitus with hyperglycemia: Secondary | ICD-10-CM

## 2021-03-29 DIAGNOSIS — K920 Hematemesis: Secondary | ICD-10-CM

## 2021-03-29 DIAGNOSIS — J029 Acute pharyngitis, unspecified: Secondary | ICD-10-CM

## 2021-03-29 DIAGNOSIS — R748 Abnormal levels of other serum enzymes: Secondary | ICD-10-CM

## 2021-03-29 DIAGNOSIS — I10 Essential (primary) hypertension: Secondary | ICD-10-CM

## 2021-03-29 DIAGNOSIS — Z23 Encounter for immunization: Secondary | ICD-10-CM

## 2021-03-29 DIAGNOSIS — R1010 Upper abdominal pain, unspecified: Secondary | ICD-10-CM

## 2021-03-29 MED ORDER — PANTOPRAZOLE SODIUM 40 MG PO TBEC
40.0000 mg | DELAYED_RELEASE_TABLET | Freq: Two times a day (BID) | ORAL | 3 refills | Status: DC
  Filled 2021-03-29 – 2021-10-22 (×2): qty 60, 30d supply, fill #0

## 2021-03-29 MED ORDER — ONDANSETRON 8 MG PO TBDP
8.0000 mg | ORAL_TABLET | Freq: Three times a day (TID) | ORAL | 3 refills | Status: DC | PRN
  Filled 2021-03-29: qty 20, 7d supply, fill #0

## 2021-03-29 MED ORDER — SUCRALFATE 1 G PO TABS
1.0000 g | ORAL_TABLET | Freq: Four times a day (QID) | ORAL | 0 refills | Status: DC
  Filled 2021-03-29 – 2021-10-22 (×2): qty 120, 30d supply, fill #0

## 2021-03-29 NOTE — Assessment & Plan Note (Signed)
Pain consistent with reflux gastritis and possible gastric ulceration given presence of intermittent hematemesis.  Fatty liver also detected on [place] Referral to GI has been placed. Patient has not heard from them as of yet Recommend let us know if she has not heard by Julia Knight Wednesday.  Stressed importance of starting carafate and pantoprozole high dose at this time Will follow

## 2021-03-29 NOTE — Assessment & Plan Note (Signed)
No recurrence since last seen Nausea and decreased PO intake continues  It appears that she did not pick up pantoprazole or carafate sent to pharmacy at last visit.  Discussed importance of avoiding tylenol and alcohol for liver and starting medications immediately. Unfortunately, will need to decrease PO intake of NSAIDs, but with her chronic back pain this may be the only option for her to protect her liver. Referral sent to GI on 11/10. Patient will notify if she has not heard from them. Discussed warnings that would warrant immediate evaluation, recommendations for food and medication avoidance, and all questions answered.

## 2021-03-29 NOTE — Patient Instructions (Signed)
Su ultrasonido muestra que su hgado parece estar "gordo", lo que puede causar dolor abdominal y muchos otros sntomas.  Esto es causado por una acumulacin de grasa en el hgado y puede hacer que el hgado se endurezca y no funcione tan bien como debe.  He enviado una referencia para Merchant navy officer (mdico del estmago) para una evaluacin.  Lea la informacin que proporcion al reverso de este documento sobre el "hgado graso" y los cambios especiales en la dieta que pueden ayudar con los sntomas.  El gastroenterlogo se comunicar con usted para Engineer, water cita para ser visto. Si no ha tenido noticias de ellos antes del 1 de diciembre, hganoslo saber.

## 2021-03-29 NOTE — Progress Notes (Signed)
Established Patient Office Visit  Subjective:  Patient ID: Julia Knight, female    DOB: February 16, 1951  Age: 70 y.o. MRN: [id number]  CC:  Chief Complaint  Patient presents with   Follow-up    Patient is here for follow up on Imaging results, she would like her flu shot while she is in the office today.     HPI Julia Knight presents for follow-up for abdominal ultrasound due to ongoing upper abdominal pain that has been present for several years.  Ultrasound showed evidence of hepatosteatosis with no other concerning abnormalities.  Today she tells me she has still been experiencing severe abdominal pain and nausea. No blood in emesis in past few days.  She endorses inability to eat sometimes for 2 days at a time due to pain. She endorses epigastric pain and tenderness.  She tells me that at times food comes up in her throat and she feels like she cannot breath.  She also reports burning in her throat.  She does take NSAIDs for pain on a routine basis.   Past Medical History:  Diagnosis Date   Acute blood loss anemia 09/10/2020   Allergic rhinitis 08/24/2011   Allergy    Anxiety    Arthritis    back and hands   Constipation due to opioid therapy 09/18/2020   COVID-19 11/16/2018   Depression    Diabetes mellitus without complication ([place])    type 2   Dizziness 08/16/2017   Encounter for medical examination to establish care 07/24/2020   Gastroenteritis, infectious, presumed 08/16/2017   GERD (gastroesophageal reflux disease)    Glossitis 09/10/2020   Hyperlipemia    Hypertension    Neuromuscular disorder ([place])     Past Surgical History:  Procedure Laterality Date   APPENDECTOMY     CHOLECYSTECTOMY     TONSILLECTOMY     removed as an adult    History reviewed. No pertinent family history.  Social History   Socioeconomic History   Marital status: Widowed    Spouse name: Not on file   Number of children: Not on file   Years of education: Not  on file   Highest education level: Not on file  Occupational History   Not on file  Tobacco Use   Smoking status: Former    Types: Cigarettes   Smokeless tobacco: Never  Vaping Use   Vaping Use: Never used  Substance and Sexual Activity   Alcohol use: Never   Drug use: Never   Sexual activity: Not Currently  Other Topics Concern   Not on file  Social History Narrative   ** Merged History Encounter **       Social Determinants of Health   Financial Resource Strain: Not on file  Food Insecurity: Not on file  Transportation Needs: Not on file  Physical Activity: Not on file  Stress: Not on file  Social Connections: Not on file  Intimate Partner Violence: Not on file    Outpatient Medications Prior to Visit  Medication Sig Dispense Refill   amoxicillin-clavulanate (AUGMENTIN) 875-125 MG tablet Take 1 tablet by mouth 2 (two) times daily. 10 tablet 0   atorvastatin (LIPITOR) 40 MG tablet Take 1 tablet (40 mg total) by mouth at bedtime. 30 tablet 11   calcium carbonate (OSCAL) 1500 (600 Ca) MG TABS tablet Take 600 mg of elemental calcium by mouth in the morning.     docusate sodium (COLACE) 100 MG capsule Take 1 capsule (100 mg total) by  mouth 2 (two) times daily. 60 capsule 0   ferrous sulfate 325 (65 FE) MG EC tablet Take 1 tablet by mouth every Monday, Wednesday, and Friday. For low iron. 90 tablet 5   gabapentin (NEURONTIN) 300 MG capsule Take one tablet (332m)  by mouth every 8 hours as needed for pain. [place] una tableta por oral cada 8 horas segun sea necesario para el dolor. 90 capsule 3   HYDROcodone-acetaminophen (NORCO/VICODIN) 5-325 MG tablet Take 1 tablet by mouth every 8 (eight) hours. All future prescriptions must come from surgery. Tome una tableta por via oral cada 8 horas segun sea necesario para el dolor. Cualquier receta futura debe provenir de la [place] 30 tablet 0   lisinopril (ZESTRIL) 20 MG tablet Take 1 tablet (20 mg total) by mouth daily. 30 tablet 11    metFORMIN (GLUCOPHAGE) 1000 MG tablet Take 1 tablet (1,000 mg total) by mouth 2 (two) times daily; in the morning and evening with a meal 60 tablet 11   Multiple Vitamin (MULTIVITAMIN WITH MINERALS) TABS tablet Take 1 tablet by mouth in the morning.     omega-3 acid ethyl esters (LOVAZA) 1 g capsule Take 1 g by mouth daily.     polyethylene glycol (MIRALAX / GLYCOLAX) 17 g packet Take 17 g by mouth 2 (two) times daily. Take twice a day every day while on pain medications. Then may take as needed. 30 packet 6   pantoprazole (PROTONIX) 40 MG tablet Take 1 tablet (40 mg total) by mouth 2 (two) times daily. 60 tablet 3   sucralfate (CARAFATE) 1 g tablet Take 1 tablet (1 g total) by mouth 4 (four) times daily. 120 tablet 0   No facility-administered medications prior to visit.    No Known Allergies  ROS Review of Systems All review of systems negative except what is listed in the HPI    Objective:    Physical Exam Vitals and nursing note reviewed.  Constitutional:      Appearance: Normal appearance.  Eyes:     Extraocular Movements: Extraocular movements intact.     Conjunctiva/sclera: Conjunctivae normal.     Pupils: Pupils are equal, round, and reactive to light.  Abdominal:     General: Bowel sounds are normal. There is no distension.     Palpations: Abdomen is soft. There is no mass.     Tenderness: There is abdominal tenderness in the right upper quadrant, epigastric area and left upper quadrant. There is no right CVA tenderness, left CVA tenderness, guarding or rebound.  Skin:    General: Skin is warm and dry.     Capillary Refill: Capillary refill takes less than 2 seconds.  Neurological:     General: No focal deficit present.     Mental Status: She is alert and oriented to person, place, and time.  Psychiatric:        Mood and Affect: Mood normal.        Behavior: Behavior normal.        Thought Content: Thought content normal.        Judgment: Judgment normal.    BP  119/72   Pulse 72   Ht 5' (1.524 m)   Wt 184 lb 12.8 oz (83.8 kg)   SpO2 97%   BMI 36.09 kg/m  Wt Readings from Last 3 Encounters:  03/29/21 184 lb 12.8 oz (83.8 kg)  03/18/21 182 lb 9.6 oz (82.8 kg)  03/04/21 182 lb (82.6 kg)     Health Maintenance  Due  Topic Date Due   Pneumonia Vaccine 39+ Years old (1 - PCV) Never done   OPHTHALMOLOGY EXAM  Never done   Hepatitis C Screening  Never done   COLONOSCOPY (Pts 45-77yr Insurance coverage will need to be confirmed)  Never done   Zoster Vaccines- Shingrix (1 of 2) Never done   COVID-19 Vaccine (3 - Booster for Pfizer series) 09/21/2019   INFLUENZA VACCINE  12/07/2020    There are no preventive care reminders to display for this patient.  No results found for: TSH Lab Results  Component Value Date   WBC 13.4 (H) 03/15/2021   HGB 11.4 (L) 03/15/2021   HCT 35.9 (L) 03/15/2021   MCV 87.3 03/15/2021   PLT 312 03/15/2021   Lab Results  Component Value Date   NA 136 03/15/2021   K 5.0 03/15/2021   CO2 20 (L) 03/15/2021   GLUCOSE 178 (H) 03/15/2021   BUN 19 03/15/2021   CREATININE 1.06 (H) 03/15/2021   BILITOT 0.8 03/15/2021   ALKPHOS 160 (H) 03/15/2021   AST 13 (L) 03/15/2021   ALT 17 03/15/2021   PROT 8.1 03/15/2021   ALBUMIN 3.8 03/15/2021   CALCIUM 9.7 03/15/2021   ANIONGAP 10 03/15/2021   EGFR 81 10/26/2020   Lab Results  Component Value Date   CHOL 204 (H) 07/30/2020   Lab Results  Component Value Date   HDL 47 07/30/2020   Lab Results  Component Value Date   LDLCALC 114 (H) 07/30/2020   Lab Results  Component Value Date   TRIG 214 (H) 07/30/2020   Lab Results  Component Value Date   CHOLHDL 4.3 07/30/2020   Lab Results  Component Value Date   HGBA1C 7.1 10/26/2020      Assessment & Plan:   Problem List Items Addressed This Visit     BMI 34.0-34.9,adult    Elevated BMI and recent evidence of hepatosteatosis present on [place]iscussed importance of weight loss and avoidance of high fatty  foods.  Referral to GI placed.       Relevant Orders   CBC with Differential/Platelet   Comprehensive metabolic panel   Hemoglobin A1c   Lipid panel   Hyperlipidemia    Will monitor labs today No changes to medications at this time      Relevant Orders   CBC with Differential/Platelet   Comprehensive metabolic panel   Hemoglobin A1c   Lipid panel   T2DM (type 2 diabetes mellitus) ([place]    Will monitor labs today Need to ensure optimal control in the setting of fatty liver.  Discussed dietary recommendations Will f/u in 3 months      Relevant Orders   CBC with Differential/Platelet   Comprehensive metabolic panel   Hemoglobin A1c   Lipid panel   VITAMIN D 25 Hydroxy (Vit-D Deficiency, Fractures)   Pain of upper abdomen    Pain consistent with reflux gastritis and possible gastric ulceration given presence of intermittent hematemesis.  Fatty liver also detected on [place] GI has been placed. Patient has not heard from them as of yet Recommend let [place]now if she has not heard by early Wednesday.  Stressed importance of starting carafate and pantoprozole high dose at this time Will follow      Relevant Medications   sucralfate (CARAFATE) 1 g tablet   pantoprazole (PROTONIX) 40 MG tablet   Nausea    See pain in upper abdomen      Relevant Medications   sucralfate (  CARAFATE) 1 g tablet   pantoprazole (PROTONIX) 40 MG tablet   Hematemesis with nausea    No recurrence since last seen Nausea and decreased PO intake continues  It appears that she did not pick up pantoprazole or carafate sent to pharmacy at last visit.  Discussed importance of avoiding tylenol and alcohol for liver and starting medications immediately. Unfortunately, will need to decrease PO intake of NSAIDs, but with her chronic back pain this may be the only option for her to protect her liver. Referral sent to GI on 11/10. Patient will notify if she has not heard from them. Discussed warnings  that would warrant immediate evaluation, recommendations for food and medication avoidance, and all questions answered.       Relevant Medications   sucralfate (CARAFATE) 1 g tablet   pantoprazole (PROTONIX) 40 MG tablet   ondansetron (ZOFRAN-ODT) 8 MG disintegrating tablet   Sore throat    Likely related to reflux symptoms Begin pantoprazole and carafate GI referral has been placed.  Recommend sitting upright after meals and eating smaller portions Avoid caffeine, carbonation, and high fatty foods      Relevant Medications   sucralfate (CARAFATE) 1 g tablet   pantoprazole (PROTONIX) 40 MG tablet   Nonalcoholic hepatosteatosis - Primary    Recent US indicates presence of fatty liver Discussed this diagnosis with patient and recommendations for further evaluation with GI to help ensure that this is not advanced and make recommendations for plan of care Avoid alcohol and tylenol intake Let us know if you have not heard from GI by mid-week      Relevant Orders   CBC with Differential/Platelet   Comprehensive metabolic panel   Hemoglobin A1c   Lipid panel   VITAMIN D 25 Hydroxy (Vit-D Deficiency, Fractures)   Other Visit Diagnoses     Essential hypertension       Relevant Orders   CBC with Differential/Platelet   Comprehensive metabolic panel   Hemoglobin A1c   Lipid panel   Elevated alkaline phosphatase measurement       Relevant Orders   VITAMIN D 25 Hydroxy (Vit-D Deficiency, Fractures)       Meds ordered this encounter  Medications   sucralfate (CARAFATE) 1 g tablet    Sig: Take 1 tablet (1 g total) by mouth 4 (four) times daily.    Dispense:  120 tablet    Refill:  0   pantoprazole (PROTONIX) 40 MG tablet    Sig: Take 1 tablet (40 mg total) by mouth 2 (two) times daily.    Dispense:  60 tablet    Refill:  3   ondansetron (ZOFRAN-ODT) 8 MG disintegrating tablet    Sig: Take 1 tablet (8 mg total) by mouth every 8 (eight) hours as needed for nausea.     Dispense:  20 tablet    Refill:  3    Follow-up: Return in about 3 months (around 06/29/2021) for DM.  Time: 30 minutes, >50% spent counseling, care coordination, chart review, and documentation.    Orma Render, NP

## 2021-03-29 NOTE — Addendum Note (Signed)
Addended by: Dareen Piano on: 03/29/2021 12:18 PM   Modules accepted: Orders

## 2021-03-29 NOTE — Assessment & Plan Note (Signed)
Will monitor labs today No changes to medications at this time

## 2021-03-29 NOTE — Assessment & Plan Note (Signed)
See pain in upper abdomen

## 2021-03-29 NOTE — Assessment & Plan Note (Signed)
Likely related to reflux symptoms Begin pantoprazole and carafate GI referral has been placed.  Recommend sitting upright after meals and eating smaller portions Avoid caffeine, carbonation, and high fatty foods

## 2021-03-29 NOTE — Assessment & Plan Note (Signed)
Recent US indicates presence of fatty liver Discussed this diagnosis with patient and recommendations for further evaluation with GI to help ensure that this is not advanced and make recommendations for plan of care Avoid alcohol and tylenol intake Let us know if you have not heard from GI by mid-week

## 2021-03-29 NOTE — Assessment & Plan Note (Signed)
Will monitor labs today Need to ensure optimal control in the setting of fatty liver.  Discussed dietary recommendations Will f/u in 3 months

## 2021-03-29 NOTE — Assessment & Plan Note (Signed)
Elevated BMI and recent evidence of hepatosteatosis present on [place] Discussed importance of weight loss and avoidance of high fatty foods.  Referral to GI placed.

## 2021-03-30 LAB — CBC WITH DIFFERENTIAL/PLATELET
Basophils Absolute: 0.1 10*3/uL (ref 0.0–0.2)
Basos: 1 %
EOS (ABSOLUTE): 0.5 10*3/uL — ABNORMAL HIGH (ref 0.0–0.4)
Eos: 4 %
Hematocrit: 35.7 % (ref 34.0–46.6)
Hemoglobin: 11.4 g/dL (ref 11.1–15.9)
Immature Grans (Abs): 0 10*3/uL (ref 0.0–0.1)
Immature Granulocytes: 0 %
Lymphocytes Absolute: 3.7 10*3/uL — ABNORMAL HIGH (ref 0.7–3.1)
Lymphs: 34 %
MCH: 27.3 pg (ref 26.6–33.0)
MCHC: 31.9 g/dL (ref 31.5–35.7)
MCV: 85 fL (ref 79–97)
Monocytes Absolute: 0.7 10*3/uL (ref 0.1–0.9)
Monocytes: 7 %
Neutrophils Absolute: 6 10*3/uL (ref 1.4–7.0)
Neutrophils: 54 %
Platelets: 291 10*3/uL (ref 150–450)
RBC: 4.18 x10E6/uL (ref 3.77–5.28)
RDW: 13.4 % (ref 11.7–15.4)
WBC: 11 10*3/uL — ABNORMAL HIGH (ref 3.4–10.8)

## 2021-03-30 LAB — COMPREHENSIVE METABOLIC PANEL
ALT: 12 IU/L (ref 0–32)
AST: 9 IU/L (ref 0–40)
Albumin/Globulin Ratio: 1.3 (ref 1.2–2.2)
Albumin: 4.3 g/dL (ref 3.8–4.8)
Alkaline Phosphatase: 183 IU/L — ABNORMAL HIGH (ref 44–121)
BUN/Creatinine Ratio: 18 (ref 12–28)
BUN: 19 mg/dL (ref 8–27)
Bilirubin Total: <0.2 mg/dL (ref 0.0–1.2)
CO2: 20 mmol/L (ref 20–29)
Calcium: 9.8 mg/dL (ref 8.7–10.3)
Chloride: 102 mmol/L (ref 96–106)
Creatinine, Ser: 1.03 mg/dL — ABNORMAL HIGH (ref 0.57–1.00)
Globulin, Total: 3.2 g/dL (ref 1.5–4.5)
Glucose: 198 mg/dL — ABNORMAL HIGH (ref 70–99)
Potassium: 5.2 mmol/L (ref 3.5–5.2)
Sodium: 138 mmol/L (ref 134–144)
Total Protein: 7.5 g/dL (ref 6.0–8.5)
eGFR: 59 mL/min/{1.73_m2} — ABNORMAL LOW (ref >59)

## 2021-03-30 LAB — LIPID PANEL
Chol/HDL Ratio: 3.6 ratio (ref 0.0–4.4)
Cholesterol, Total: 177 mg/dL (ref 100–199)
HDL: 49 mg/dL (ref >39)
LDL Chol Calc (NIH): 90 mg/dL (ref 0–99)
Triglycerides: 227 mg/dL — ABNORMAL HIGH (ref 0–149)
VLDL Cholesterol Cal: 38 mg/dL (ref 5–40)

## 2021-03-30 LAB — HEMOGLOBIN A1C
Est. average glucose Bld gHb Est-mCnc: 203 mg/dL
Hgb A1c MFr Bld: 8.7 % — ABNORMAL HIGH (ref 4.8–5.6)

## 2021-03-30 LAB — VITAMIN D 25 HYDROXY (VIT D DEFICIENCY, FRACTURES): Vit D, 25-Hydroxy: 33.3 ng/mL (ref 30.0–100.0)

## 2021-03-31 ENCOUNTER — Telehealth (HOSPITAL_BASED_OUTPATIENT_CLINIC_OR_DEPARTMENT_OTHER): Payer: Self-pay

## 2021-03-31 DIAGNOSIS — K760 Fatty (change of) liver, not elsewhere classified: Secondary | ICD-10-CM

## 2021-03-31 NOTE — Telephone Encounter (Signed)
-----   Message from Tollie Eth, NP sent at 03/30/2021  2:18 PM EST ----- Please add Hepatitis panels to labs. (C and B)   Concerned with ongoing abdominal pain and elevation in WBC along with liver echogenicity increased.

## 2021-03-31 NOTE — Telephone Encounter (Signed)
Per Shawna Clamp, DNP add on hep b and c panel for further assesment Labs orders placed and faxed over to [facility] by Elbert Ewings, student CMA

## 2021-04-08 ENCOUNTER — Telehealth (HOSPITAL_BASED_OUTPATIENT_CLINIC_OR_DEPARTMENT_OTHER): Payer: Self-pay | Admitting: Nurse Practitioner

## 2021-04-08 LAB — HEPATITIS B CORE ANTIBODY, IGM

## 2021-04-08 LAB — FERRITIN

## 2021-04-08 LAB — SPECIMEN STATUS REPORT

## 2021-04-08 LAB — HEPATITIS B SURFACE ANTIBODY,QUALITATIVE

## 2021-04-08 LAB — HEPATITIS C ANTIBODY

## 2021-04-08 LAB — HEPATITIS B SURFACE ANTIGEN

## 2021-04-08 NOTE — Telephone Encounter (Signed)
Julia Knight's lab work from 11/23 is still pending on [facility] website. Please call to see if this is still in progress or if there were problems that we will need to recollect the samples. Thank you.

## 2021-04-27 ENCOUNTER — Ambulatory Visit (HOSPITAL_BASED_OUTPATIENT_CLINIC_OR_DEPARTMENT_OTHER): Payer: Self-pay | Admitting: Nurse Practitioner

## 2021-04-30 ENCOUNTER — Encounter: Payer: Self-pay | Admitting: Internal Medicine

## 2021-05-07 ENCOUNTER — Encounter (HOSPITAL_BASED_OUTPATIENT_CLINIC_OR_DEPARTMENT_OTHER): Payer: Self-pay | Admitting: Nurse Practitioner

## 2021-05-26 ENCOUNTER — Ambulatory Visit: Payer: No Typology Code available for payment source | Admitting: Internal Medicine

## 2021-06-03 ENCOUNTER — Other Ambulatory Visit: Payer: Self-pay

## 2021-06-03 ENCOUNTER — Telehealth (HOSPITAL_BASED_OUTPATIENT_CLINIC_OR_DEPARTMENT_OTHER): Payer: Self-pay

## 2021-06-03 ENCOUNTER — Other Ambulatory Visit (HOSPITAL_COMMUNITY): Payer: Self-pay

## 2021-06-03 DIAGNOSIS — I10 Essential (primary) hypertension: Secondary | ICD-10-CM

## 2021-06-03 MED ORDER — LISINOPRIL 20 MG PO TABS
20.0000 mg | ORAL_TABLET | Freq: Every day | ORAL | 11 refills | Status: DC
  Filled 2021-06-03: qty 30, 30d supply, fill #0

## 2021-06-03 MED ORDER — LISINOPRIL 20 MG PO TABS: 20.0000 mg | ORAL_TABLET | Freq: Every day | ORAL | 6 refills | Status: DC

## 2021-06-03 MED ORDER — LISINOPRIL 20 MG PO TABS
20.0000 mg | ORAL_TABLET | Freq: Every day | ORAL | 6 refills | Status: DC
  Filled 2021-06-03 – 2021-10-22 (×3): qty 30, 30d supply, fill #0

## 2021-06-03 NOTE — Addendum Note (Signed)
Addended by: Campbell Riches on: 06/03/2021 08:57 AM   Modules accepted: Orders

## 2021-06-03 NOTE — Telephone Encounter (Signed)
Patient son called in requesting refill [facility] there is a note in the chart stating that pharmacy is closed Refill sent to [facility] on [address]

## 2021-06-04 ENCOUNTER — Other Ambulatory Visit (HOSPITAL_COMMUNITY): Payer: Self-pay

## 2021-06-10 ENCOUNTER — Other Ambulatory Visit (HOSPITAL_COMMUNITY): Payer: Self-pay

## 2021-06-29 ENCOUNTER — Ambulatory Visit (HOSPITAL_BASED_OUTPATIENT_CLINIC_OR_DEPARTMENT_OTHER): Payer: Self-pay | Admitting: Nurse Practitioner

## 2021-07-16 ENCOUNTER — Encounter (HOSPITAL_BASED_OUTPATIENT_CLINIC_OR_DEPARTMENT_OTHER): Payer: Self-pay | Admitting: Nurse Practitioner

## 2021-09-02 ENCOUNTER — Ambulatory Visit (INDEPENDENT_AMBULATORY_CARE_PROVIDER_SITE_OTHER): Payer: Self-pay

## 2021-09-02 ENCOUNTER — Other Ambulatory Visit (HOSPITAL_COMMUNITY): Payer: Self-pay

## 2021-09-02 ENCOUNTER — Ambulatory Visit (INDEPENDENT_AMBULATORY_CARE_PROVIDER_SITE_OTHER): Payer: Self-pay | Admitting: Specialist

## 2021-09-02 ENCOUNTER — Encounter: Payer: Self-pay | Admitting: Specialist

## 2021-09-02 VITALS — BP 132/72 | HR 80 | Ht 60.0 in | Wt 185.0 lb

## 2021-09-02 DIAGNOSIS — M5416 Radiculopathy, lumbar region: Secondary | ICD-10-CM

## 2021-09-02 DIAGNOSIS — M48062 Spinal stenosis, lumbar region with neurogenic claudication: Secondary | ICD-10-CM

## 2021-09-02 DIAGNOSIS — R29898 Other symptoms and signs involving the musculoskeletal system: Secondary | ICD-10-CM

## 2021-09-02 DIAGNOSIS — M4316 Spondylolisthesis, lumbar region: Secondary | ICD-10-CM

## 2021-09-02 DIAGNOSIS — Z981 Arthrodesis status: Secondary | ICD-10-CM

## 2021-09-02 DIAGNOSIS — Z9889 Other specified postprocedural states: Secondary | ICD-10-CM

## 2021-09-02 DIAGNOSIS — M5136 Other intervertebral disc degeneration, lumbar region: Secondary | ICD-10-CM

## 2021-09-02 MED ORDER — TRAMADOL-ACETAMINOPHEN 37.5-325 MG PO TABS
1.0000 | ORAL_TABLET | Freq: Four times a day (QID) | ORAL | 0 refills | Status: DC | PRN
Start: 2021-09-02 — End: 2022-01-06
  Filled 2021-09-02: qty 30, 8d supply, fill #0

## 2021-09-02 MED ORDER — VITAMIN D (ERGOCALCIFEROL) 1.25 MG (50000 UNIT) PO CAPS
50000.0000 [IU] | ORAL_CAPSULE | ORAL | 0 refills | Status: DC
  Filled 2021-09-02: qty 4, 28d supply, fill #0
  Filled 2021-10-22 (×2): qty 1, 7d supply, fill #0

## 2021-09-02 MED ORDER — MELOXICAM 15 MG PO TABS
15.0000 mg | ORAL_TABLET | Freq: Every day | ORAL | 2 refills | Status: DC
  Filled 2021-09-02 – 2021-10-22 (×2): qty 30, 30d supply, fill #0

## 2021-09-02 NOTE — Progress Notes (Signed)
Office Visit Note   Patient: Julia Knight           Date of Birth: 03/01/1951           MRN: [id number] Visit Date: 09/02/2021              Requested by: Tollie Eth, NP [address] [place],  [place] [address] PCP: Tollie Eth, NP   Assessment & Plan: Visit Diagnoses:  1. S/P lumbar fusion   2. Spondylolisthesis, lumbar region   3. Spinal stenosis of lumbar region with neurogenic claudication   4. Lumbar radiculopathy   5. S/P lumbar laminectomy   6. Radiculopathy, lumbar region   7. Weakness of right leg   8. Degenerative disc disease, lumbar     Plan: Avoid frequent bending and stooping  No lifting greater than 10 lbs. May use ice or moist heat for pain. Weight loss is of benefit. Best medication for lumbar disc disease is arthritis medications like meloxicam. Meloxicam is prescribed. Exercise is important to improve your indurance and does allow people to function better inspite of back pain. Ultracet for pain MRI of the lumbar spine is ordered due to right leg weakness in foot DF and right EHL.  Vitamin D for borderline low Vitamin D level. Level is 33 normal is 30-100, we would prefer the Vitamin D level to be in the 50-70 range  Follow-Up Instructions: Return in about 3 weeks (around 09/23/2021).   Orders:  Orders Placed This Encounter  Procedures   XR Lumbar Spine 2-3 Views   No orders of the defined types were placed in this encounter.     Procedures: No procedures performed   Clinical Data: No additional findings.   Subjective: Chief Complaint  Patient presents with   Lower Back - Follow-up    1 year post op    71 year old female with history of lumbar decompression nearly one year ago. She has been experiencing pain into the right side buttock worsening over the last 2 months and she notices it with prolong standing and walking and is better with sitting. No bowel or bladder difficulty. Mainly pain with movement  and sitting down. She is not grocery shopping, that has not changed with surgery. She is experiencing night pain and also pain that is a 10 of 10.    Review of Systems  Constitutional: Negative.   HENT: Negative.    Eyes: Negative.   Respiratory: Negative.    Cardiovascular: Negative.   Gastrointestinal: Negative.   Endocrine: Negative.   Genitourinary: Negative.   Musculoskeletal: Negative.   Skin: Negative.   Allergic/Immunologic: Negative.   Neurological: Negative.   Hematological: Negative.   Psychiatric/Behavioral: Negative.      Objective: Vital Signs: BP 132/72 (BP Location: Right Arm, Patient Position: Sitting)   Pulse 80   Ht 5' (1.524 m)   Wt 185 lb (83.9 kg)   BMI 36.13 kg/m   Physical Exam Constitutional:      Appearance: She is well-developed.  HENT:     Head: Normocephalic and atraumatic.  Eyes:     Pupils: Pupils are equal, round, and reactive to light.  Pulmonary:     Effort: Pulmonary effort is normal.     Breath sounds: Normal breath sounds.  Abdominal:     General: Bowel sounds are normal.     Palpations: Abdomen is soft.  Musculoskeletal:     Cervical back: Normal range of motion and neck supple.  Lumbar back: Negative right straight leg raise test and negative left straight leg raise test.  Skin:    General: Skin is warm and dry.  Neurological:     Mental Status: She is alert and oriented to person, place, and time.  Psychiatric:        Behavior: Behavior normal.        Thought Content: Thought content normal.        Judgment: Judgment normal.    Back Exam   Tenderness  The patient is experiencing tenderness in the lumbar.  Range of Motion  Extension:  abnormal  Flexion:  abnormal  Lateral bend right:  abnormal  Lateral bend left:  abnormal  Rotation right:  abnormal  Rotation left:  abnormal   Muscle Strength  Right Quadriceps:  5/5  Left Quadriceps:  5/5  Right Hamstrings:  5/5  Left Hamstrings:  5/5   Tests  Straight  leg raise right: negative Straight leg raise left: negative  Reflexes  Patellar:  0/4 Achilles:  0/4  Other  Toe walk: abnormal Heel walk: abnormal  Comments:  Right foot DF is weak 4/5 and right EHL is weak 4/5. SLR is negative     Specialty Comments:  No specialty comments available.  Imaging: XR Lumbar Spine 2-3 Views  Result Date: 09/02/2021 AP and lateral flexion and extension radiographs of the lumbar spine demonstrate Pedicle screws and rods L3-4 and L4-5 with cages. The fusion at L3-4 appears solid. The cage at L4-5 with some lucency over the superior cage-bone Interface, Flexion and extension radiographs show motion of up to 46mm measured across the anterior L3 to L5 but there is signs of some rotation with the two films and this may relate to the increased measurement with flexion when in generally there is usually a greater measurement with extension.     PMFS History: Patient Active Problem List   Diagnosis Date Noted   Postoperative anemia due to acute blood loss 09/03/2020    Priority: High    Class: Acute   Spondylolisthesis at L4-L5 level 09/01/2020    Priority: High    Class: Chronic   Nonalcoholic hepatosteatosis 0000000   Pain of upper abdomen 03/19/2021   Nausea 03/19/2021   Hematemesis with nausea 03/19/2021   Sore throat 03/19/2021   Sinobronchitis 03/19/2021   Pressure injury of sacral region, stage 2 ([place]) 09/18/2020   Spinal stenosis, 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   Status post lumbar spinal fusion 09/01/2020   Hyperlipidemia 07/24/2020   BMI 34.0-34.9,adult 05/22/2020   Spondylolisthesis at L3-L4 level 05/22/2020   Anxiety 06/07/2018   GERD (gastroesophageal reflux disease) 06/03/2013   Left-sided low back pain with sciatica 10/09/2012   Left leg pain 08/14/2012   Depression 03/08/2012   Primary hypertension 08/24/2011   T2DM (type 2 diabetes mellitus) ([place]) 02/28/2011   Past Medical History:  Diagnosis Date    Acute blood loss anemia 09/10/2020   Allergic rhinitis 08/24/2011   Allergy    Anxiety    Arthritis    back and hands   Constipation due to opioid therapy 09/18/2020   COVID-19 11/16/2018   Depression    Diabetes mellitus without complication ([place])    type 2   Dizziness 08/16/2017   Encounter for medical examination to establish care 07/24/2020   Gastroenteritis, infectious, presumed 08/16/2017   GERD (gastroesophageal reflux disease)    Glossitis 09/10/2020   Hyperlipemia    Hypertension    Neuromuscular disorder ([place])  History reviewed. No pertinent family history.  Past Surgical History:  Procedure Laterality Date   APPENDECTOMY     CHOLECYSTECTOMY     TONSILLECTOMY     removed as an adult   Social History   Occupational History   Not on file  Tobacco Use   Smoking status: Former    Types: Cigarettes   Smokeless tobacco: Never  Vaping Use   Vaping Use: Never used  Substance and Sexual Activity   Alcohol use: Never   Drug use: Never   Sexual activity: Not Currently

## 2021-09-02 NOTE — Patient Instructions (Signed)
Avoid frequent bending and stooping  ?No lifting greater than 10 lbs. ?May use ice or moist heat for pain. ?Weight loss is of benefit. ?Best medication for lumbar disc disease is arthritis medications like meloxicam. Meloxicam is prescribed. ?Exercise is important to improve your indurance and does allow people to function better inspite of back pain. ?Ultracet for pain ?MRI of the lumbar spine is ordered due to right leg weakness in foot DF and right EHL.  ?Vitamin D for borderline low Vitamin D level. Level is 33 normal is 30-100, we would prefer the Vitamin D level to be in the 50-70 range. ?

## 2021-09-22 ENCOUNTER — Ambulatory Visit
Admission: RE | Admit: 2021-09-22 | Discharge: 2021-09-22 | Disposition: A | Discharge status: Home / Self Care | Payer: Self-pay | Source: Ambulatory Visit | Attending: Specialist | Admitting: Specialist

## 2021-09-22 DIAGNOSIS — Z9889 Other specified postprocedural states: Secondary | ICD-10-CM

## 2021-09-22 DIAGNOSIS — M4316 Spondylolisthesis, lumbar region: Secondary | ICD-10-CM

## 2021-09-22 DIAGNOSIS — M5416 Radiculopathy, lumbar region: Secondary | ICD-10-CM

## 2021-09-22 DIAGNOSIS — R29898 Other symptoms and signs involving the musculoskeletal system: Secondary | ICD-10-CM

## 2021-09-22 DIAGNOSIS — M5136 Other intervertebral disc degeneration, lumbar region: Secondary | ICD-10-CM

## 2021-09-22 MED ORDER — GADOBENATE DIMEGLUMINE 529 MG/ML IV SOLN
17.0000 mL | Freq: Once | INTRAVENOUS | Status: AC | PRN
  Administered 2021-09-22: 17 mL via INTRAVENOUS

## 2021-09-24 ENCOUNTER — Encounter: Payer: Self-pay | Admitting: Specialist

## 2021-09-24 ENCOUNTER — Ambulatory Visit (INDEPENDENT_AMBULATORY_CARE_PROVIDER_SITE_OTHER): Payer: Self-pay | Admitting: Specialist

## 2021-09-24 VITALS — BP 155/72 | HR 73 | Ht 60.0 in | Wt 185.0 lb

## 2021-09-24 DIAGNOSIS — M7062 Trochanteric bursitis, left hip: Secondary | ICD-10-CM

## 2021-09-24 DIAGNOSIS — M47816 Spondylosis without myelopathy or radiculopathy, lumbar region: Secondary | ICD-10-CM

## 2021-09-24 DIAGNOSIS — Z981 Arthrodesis status: Secondary | ICD-10-CM

## 2021-09-24 NOTE — Progress Notes (Signed)
Office Visit Note   Patient: Julia Knight           Date of Birth: [birth year]           MRN: [id number] Visit Date: 09/24/2021              Requested by: Orma Render, NP [address] [place],  [place] [address] PCP: Orma Render, NP   Assessment & Plan: Visit Diagnoses:  1. Greater trochanteric bursitis, left   2. S/P lumbar fusion   3. Spondylosis without myelopathy or radiculopathy, lumbar region     Plan: Avoid bending, stooping and avoid lifting weights greater than 10 lbs. Avoid prolong standing and walking. Avoid frequent bending and stooping  No lifting greater than 10 lbs. May use ice or moist heat for pain. Weight loss is of benefit. Handicap license is approved. PT ordered, flexion exercises of the lumbar spine and left hip ITB stretching iontophoresis, H, M and U/S.  Tylenol for pain. Consider cymbalta for arthritis pain.     Bursitis en la cadera Hip Bursitis  La bursitis en la cadera es la hinchazn de uno o ms sacos llenos de lquido (bolsas sinoviales) en la articulacin de la cadera. Las bolsas sinoviales de la cadera absorben los impactos y evitan que los huesos se rocen entre ellos. Si una bolsa se irrita, puede llenarse con lquido adicional e inflamarse. La bursitis en la cadera puede causar dolor leve a moderado, y los sntomas suelen aparecer y Armed forces operational officer a lo largo del [place]. Cules son las causas? Esta afeccin es el resultado del aumento de la friccin entre los huesos de la cadera y los tendones que rodean la articulacin de la cadera. Esta afeccin se puede presentar si usted: Hace un uso excesivo de los msculos de la cadera. Se lesiona la cadera. Tiene debilidad en los msculos de las nalgas. Tiene espolones seos. Tiene una infeccin. En algunos casos, es posible que la causa se desconozca. Qu incrementa el riesgo? Es ms probable que desarrolle esta afeccin si: Se lesion la cadera antes o tuvo una  ciruga de cadera. Tiene un problema de salud, como artritis, gota, diabetes o enfermedad de la tiroides. Tiene problemas en la columna. Tiene una pierna ms corta que la [place]. Realiza actividades deportivas que implican hacer movimientos repetitivos, Solicitor. Participa en deportes en los que hay riesgo de lesiones o cadas, como ftbol americano, artes marciales o esqu. Cules son los signos o sntomas? Los sntomas pueden aparecer y Armed forces operational officer y, con frecuencia, suelen incluir los siguientes: Dolor en la zona de la cadera o la ingle. El dolor puede intensificarse con el movimiento. Sensibilidad e hinchazn en la cadera. En casos poco frecuentes, la bolsa sinovial puede infectarse. Si esto sucede, puede tener fiebre, as como calor y enrojecimiento en la zona de la cadera. Cmo se diagnostica? Esta afeccin se puede diagnosticar en funcin de lo siguiente: Los sntomas. Sus antecedentes mdicos. Un examen fsico. Pruebas de diagnstico por imgenes, por ejemplo: Radiografas para examinar los huesos. Una resonancia magntica (RM) o una ecografa para examinar los tendones y los msculos. [place] sea. Cmo se trata? El tratamiento de esta afeccin incluye hacer reposo, aplicar hielo, aplicar presin (compresin), levantar (elevar) la zona lesionada. Esto se denomina tratamiento de RHCE (reposo, hielo, compresin, elevacin). En algunos casos, el tratamiento RHCE puede no ser suficiente para que los sntomas desaparezcan. El tratamiento tambin puede incluir lo siguiente: Medical illustrator, un bastn o un andador para Public house manager  la sobrecarga en la cadera. Tomar medicamentos para Human resources officer hinchazn y Conservation officer, historic buildings. Aplicarse una inyeccin de cortisona cerca de la zona afectada para reducir la hinchazn y Conservation officer, historic buildings. Tomar antibiticos si hay infeccin. Extraer el lquido de la bolsa sinovial para Public house manager la hinchazn y Conservation officer, historic buildings. Someterse a una ciruga para extraer una bolsa sinovial  infectada o daada. Esto es poco frecuente. El tratamiento a largo plazo puede incluir lo siguiente: Hacer ejercicios de fisioterapia para la fuerza y la flexibilidad. Identificar la causa de la bursitis para prevenir episodios futuros. Hacer cambios en el estilo de vida, como bajar de peso para reducir el esfuerzo de la cadera. Siga estas instrucciones en su casa: Control del dolor, la rigidez y la hinchazn     Si se lo indican, aplique hielo sobre la zona afectada. Para hacer esto: Ponga el hielo en una bolsa plstica. Coloque una toalla entre la piel y Therapist, nutritional. Aplique el hielo durante 20 minutos, 2 a 3 veces por da. Retire el hielo si la piel se pone de color rojo brillante. Esto es [employer]. Si no puede sentir dolor, calor o fro, tiene un mayor riesgo de que se dae la zona. Eleve la cadera tanto como sea posible sin que Corporate treasurer. Para hacerlo, pngase una almohada debajo de las caderas mientras est [place]. Si se lo indican, aplique calor en la zona afectada con la frecuencia que le haya indicado el mdico. Use la fuente de calor que el mdico le recomiende, como una compresa de calor hmedo o una almohadilla trmica. Coloque una toalla entre la piel y la fuente de Freight forwarder. Aplique calor durante 20 a 30 minutos. Retire la fuente de calor si la piel se pone de color rojo brillante. Esto es especialmente importante si no puede sentir dolor, calor o fro. Puede correr un riesgo mayor de sufrir quemaduras. Actividad No apoye el peso del cuerpo sobre la cadera hasta tanto el mdico lo autorice. Use muletas, un bastn o un andador como se lo haya indicado el mdico. Si la pierna afectada es la que [place] para Forensic psychologist, pregntele al mdico si es seguro que conduzca. Haga reposo y protjase la cadera tanto como sea posible hasta que el dolor y la hinchazn mejoren. Retome sus actividades normales segn lo indicado por el mdico. Pregntele al mdico qu actividades son seguras para  usted. Haga ejercicio como se lo haya indicado el mdico. Instrucciones generales Use los medicamentos de venta libre y los recetados solamente como se lo haya indicado el mdico. [place] y estire suavemente la zona de la lesin tan frecuentemente como le resulte cmodo. Use las vendas de compresin solamente como se lo haya indicado el mdico. Si tiene una pierna ms corta que la otra, hgase tomar las medidas para una plantilla o un aparato ortopdico. El mdico o el fisioterapeuta pueden indicarle dnde Pension scheme manager estos artculos y qu tamao necesita. Mantenga un peso saludable. Siga las instrucciones de su mdico con respecto al control de su peso. Estas pueden incluir restricciones en la dieta. Concurra a [place]. Esto es importante. Cmo se previene? Haga actividad fsica habitualmente o como se lo haya indicado el mdico. Use calzado que tenga el soporte correcto y sea adecuado para su actividad deportiva y las tareas diarias. Precaliente y elongue adecuadamente antes de hacer actividad fsica. Reljese y elongue despus de hacer actividad fsica. Haga pausas con regularidad durante una actividad repetitiva. Si una actividad le causa irritacin o dolor en la  cadera, evtela tanto como sea posible. Evite estar sentado [employer]. Dnde buscar ms informacin American Academy of Orthopaedic Surgeons (Academia Estadounidense de Cirujanos Ortopdicos): orthoinfo.aaos.org Comunquese con un mdico si: Tiene fiebre. Tiene nuevos sntomas. Tiene dificultad para caminar o [employer] cotidianas. Tiene un dolor que empeora o que no mejora con los medicamentos. Tiene la piel enrojecida o sensacin de calor en la zona de la cadera. Solicite ayuda de inmediato si: No puede mover la cadera. Siente dolor intenso. No puede controlar los [employer]. Resumen La bursitis en la cadera es la hinchazn de uno o ms sacos llenos de lquido  (bolsas sinoviales) en la articulacin de la cadera. La bursitis en la cadera puede causar dolor en la cadera o en la ingle, y los sntomas suelen aparecer y Armed forces operational officer a lo largo del [place]. El tratamiento de esta afeccin con frecuencia incluye hacer reposo, aplicar hielo, aplicar presin (compresin), levantar (elevar) la zona lesionada. Es posible que se necesiten otros tratamientos. Esta informacin no tiene Marine scientist el consejo del mdico. Asegrese de hacerle al mdico cualquier pregunta que tenga. Document Revised: 05/21/2021 Document Reviewed: 05/21/2021 Elsevier Patient Education  [facility] [place] Instructions: No follow-ups on file.   Orders:  No orders of the defined types were placed in this encounter.  No orders of the defined types were placed in this encounter.     Procedures: No procedures performed   Clinical Data: Findings:  CLINICAL DATA:  New right L5 weakness and pain, radiculopathy 1 year post L3-L4 and L4-L5 fusion   EXAM: MRI LUMBAR SPINE WITHOUT AND WITH CONTRAST   TECHNIQUE: Multiplanar and multiecho pulse sequences of the lumbar spine were obtained without and with intravenous contrast.   CONTRAST:  8mL MULTIHANCE GADOBENATE DIMEGLUMINE 529 MG/ML IV SOLN   COMPARISON:  Lumbar spine radiographs most recently 09/02/2021   FINDINGS: Segmentation: There is transitional anatomy with partial sacralization of the L5 vertebral body. For the purposes of this report, the lowest formed disc space is designated L5-S1 with postsurgical changes at L3 through L5.   Alignment: There is grade 1 retrolisthesis of L1 on L2 of L2 on L3, and grade 1 anterolisthesis of L3 on L4 and L4 on L5.   Vertebrae: Postsurgical changes reflecting posterior instrumented fusion and decompression at L3-L4 and L4-L5 as well as decompression at L1-L2 and L2-L3 are seen. Background marrow signal is normal. There is no suspicious marrow signal abnormality  or marrow edema. There is no abnormal marrow enhancement.   Conus medullaris and cauda equina: Conus extends to the L1 level. Conus and cauda equina appear normal. There is no abnormal enhancement of the cauda equina nerve roots.   Paraspinal and other soft tissues: Postsurgical changes in the posterior soft tissues are noted. There is a 1.9 cm TV x 1.1 cm AP x 3.1 cm cc fluid collection centered at the L4-L5 level favored to reflect a seroma.   Disc levels:   There is multilevel disc desiccation and narrowing at the nonsurgical levels, most advanced at L1-L2 and L2-L3.   T12-L1: There is a mild disc bulge and right worse than left facet arthropathy resulting in mild spinal canal stenosis and mild right worse than left neural foraminal stenosis.   L1-L2: Status post posterior decompression. There is a diffuse disc bulge and bilateral facet arthropathy resulting in mild spinal canal stenosis with narrowing of the subarticular zones and moderate bilateral neural foraminal stenosis   L2-L3: Status post  posterior decompression. There is a grade 1 retrolisthesis, a diffuse disc bulge, and bilateral facet arthropathy resulting in narrowing of the bilateral subarticular zones without significant spinal canal stenosis, and severe bilateral neural foraminal stenosis.   L3-L4: Status post posterior instrumented fusion and decompression. There is grade 1 retrolisthesis anterolisthesis with superior migration of disc material along the posterior L3 endplate ([id number]), and bilateral facet arthropathy resulting in moderate left and no significant right neural foraminal stenosis and no significant spinal canal stenosis.   L4-L5: Status post posterior instrumented fusion and decompression. There is a grade 1 anterolisthesis with residual disc bulge and bilateral facet arthropathy resulting in mild bilateral neural foraminal stenosis without significant spinal canal stenosis   L5-S1: No  significant spinal canal or neural foraminal stenosis.   IMPRESSION: 1. Postsurgical changes reflecting posterior decompression from L1-L2 through L4-L5 and fusion from L3 through L5. 2. No significant spinal canal stenosis at the fused levels, but there is grade 1 anterolisthesis at L3-L4 and L4-L5 with moderate left neural foraminal stenosis at L3-L4, and mild bilateral neural foraminal stenosis at L5-S1. 3. Grade 1 retrolisthesis with a diffuse disc bulge and bilateral facet arthropathy at L2-L3 resulting in narrowing of the subarticular zones and severe bilateral neural foraminal stenosis. 4. Mild subarticular zone narrowing moderate bilateral neural foraminal stenosis at L1-L2. 5. Mild subarticular zone narrowing and severe bilateral neural foraminal stenosis at L2-L3.     Electronically Signed   By: Valetta Mole M.D.   On: 09/23/2021 10:06      Subjective: Chief Complaint  Patient presents with   Lower Back - Follow-up    MRI Review    71 year old female with history of lumbar laminectomy L2-3 with TLIFs L4-5 and L3-4 for spondylolisthesis at L3-4 and L4-5 and moderate stenosis at L2-3, mild L1-2. She is having persistent pain in the back and difficulty with prolong standing and walking. Complains of weakness in her legs the longer she is up and standing. No bowel or bladder difficulty.     Review of Systems  Constitutional: Negative.   HENT: Negative.    Eyes: Negative.   Respiratory: Negative.    Cardiovascular: Negative.   Gastrointestinal: Negative.   Endocrine: Negative.   Genitourinary: Negative.   Musculoskeletal: Negative.   Skin: Negative.   Allergic/Immunologic: Negative.   Neurological: Negative.   Hematological: Negative.   Psychiatric/Behavioral: Negative.       Objective: Vital Signs: BP (!) 155/72 (BP Location: Left Arm, Patient Position: Sitting)   Pulse 73   Ht 5' (1.524 m)   Wt 185 lb (83.9 kg)   BMI 36.13 kg/m   Physical  Exam Constitutional:      Appearance: She is well-developed.  HENT:     Head: Normocephalic and atraumatic.  Eyes:     Pupils: Pupils are equal, round, and reactive to light.  Pulmonary:     Effort: Pulmonary effort is normal.     Breath sounds: Normal breath sounds.  Abdominal:     General: Bowel sounds are normal.     Palpations: Abdomen is soft.  Musculoskeletal:     Cervical back: Normal range of motion and neck supple.     Lumbar back: Negative left straight leg raise test.  Skin:    General: Skin is warm and dry.  Neurological:     Mental Status: She is alert and oriented to person, place, and time.  Psychiatric:        Behavior: Behavior normal.  Thought Content: Thought content normal.        Judgment: Judgment normal.     Left Hip Exam   Tenderness  The patient is experiencing tenderness in the greater trochanter.  Range of Motion  Abduction:  normal  Adduction:  normal  Extension:  normal  Flexion:  normal  External rotation:  normal  Internal rotation: normal   Other  Erythema: absent Scars: absent Sensation: normal Pulse: present  Comments:  Tender left greater trochanter.   Back Exam   Tenderness  The patient is experiencing tenderness in the lumbar.  Range of Motion  Extension:  abnormal  Flexion:  abnormal  Lateral bend right:  abnormal  Lateral bend left:  abnormal  Rotation right:  abnormal  Rotation left:  abnormal   Muscle Strength  Right Quadriceps:  5/5  Left Quadriceps:  5/5  Right Hamstrings:  5/5  Left Hamstrings:  5/5   Tests  Straight leg raise left: negative  Reflexes  Patellar:  0/4 Achilles:  0/4 Biceps:  2/4 Babinski's sign: normal   Other  Toe walk: normal Heel walk: normal Sensation: normal Gait: abnormal  Erythema: no back redness Scars: present  Comments:  No focal motor deficit. Study with adjacent level stenosis with foramenal narrowing L2-3 and L1-2. Increased retrolisthesis L2-3.        Specialty Comments:  No specialty comments available.  Imaging: No results found.   PMFS History: Patient Active Problem List   Diagnosis Date Noted   Postoperative anemia due to acute blood loss 09/03/2020    Priority: High    Class: Acute   Spondylolisthesis at L4-L5 level 09/01/2020    Priority: High    Class: Chronic   Nonalcoholic hepatosteatosis 03/29/2021   Pain of upper abdomen 03/19/2021   Nausea 03/19/2021   Hematemesis with nausea 03/19/2021   Sore throat 03/19/2021   Sinobronchitis 03/19/2021   Pressure injury of sacral region, stage 2 (HCC) 09/18/2020   Spinal stenosis, 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   Status post lumbar spinal fusion 09/01/2020   Hyperlipidemia 07/24/2020   BMI 34.0-34.9,adult 05/22/2020   Spondylolisthesis at L3-L4 level 05/22/2020   Anxiety 06/07/2018   GERD (gastroesophageal reflux disease) 06/03/2013   Left-sided low back pain with sciatica 10/09/2012   Left leg pain 08/14/2012   Depression 03/08/2012   Primary hypertension 08/24/2011   T2DM (type 2 diabetes mellitus) (HCC) 02/28/2011   Past Medical History:  Diagnosis Date   Acute blood loss anemia 09/10/2020   Allergic rhinitis 08/24/2011   Allergy    Anxiety    Arthritis    back and hands   Constipation due to opioid therapy 09/18/2020   COVID-19 11/16/2018   Depression    Diabetes mellitus without complication (HCC)    type 2   Dizziness 08/16/2017   Encounter for medical examination to establish care 07/24/2020   Gastroenteritis, infectious, presumed 08/16/2017   GERD (gastroesophageal reflux disease)    Glossitis 09/10/2020   Hyperlipemia    Hypertension    Neuromuscular disorder (HCC)     No family history on file.  Past Surgical History:  Procedure Laterality Date   APPENDECTOMY     CHOLECYSTECTOMY     TONSILLECTOMY     removed as an adult   Social History   Occupational History   Not on file  Tobacco Use   Smoking status: Former     Types: Cigarettes   Smokeless tobacco: Never  Vaping Use   Vaping Use: Never used  Substance and Sexual Activity   Alcohol use: Never   Drug use: Never   Sexual activity: Not Currently

## 2021-09-24 NOTE — Patient Instructions (Addendum)
Avoid bending, stooping and avoid lifting weights greater than 10 lbs. Avoid prolong standing and walking. Avoid frequent bending and stooping  No lifting greater than 10 lbs. May use ice or moist heat for pain. Weight loss is of benefit. Handicap license is approved. PT ordered, flexion exercises of the lumbar spine and left hip ITB stretching iontophoresis, H, M and U/S.  Tylenol for pain. Consider cymbalta for arthritis pain.     Bursitis en la cadera Hip Bursitis  La bursitis en la cadera es la hinchazn de uno o ms sacos llenos de lquido (bolsas sinoviales) en la articulacin de la cadera. Las bolsas sinoviales de la cadera absorben los impactos y evitan que los huesos se rocen entre ellos. Si una bolsa se irrita, puede llenarse con lquido adicional e inflamarse. La bursitis en la cadera puede causar dolor leve a moderado, y los sntomas suelen aparecer y Geneticist, moleculardesaparecer a lo largo del [place]. Cules son las causas? Esta afeccin es el resultado del aumento de la friccin entre los huesos de la cadera y los tendones que rodean la articulacin de la cadera. Esta afeccin se puede presentar si usted: Hace un uso excesivo de los msculos de la cadera. Se lesiona la cadera. Tiene debilidad en los msculos de las nalgas. Tiene espolones seos. Tiene una infeccin. En algunos casos, es posible que la causa se desconozca. Qu incrementa el riesgo? Es ms probable que desarrolle esta afeccin si: Se lesion la cadera antes o tuvo una ciruga de cadera. Tiene un problema de salud, como artritis, gota, diabetes o enfermedad de la tiroides. Tiene problemas en la columna. Tiene una pierna ms corta que la [place]. Realiza actividades deportivas que implican hacer movimientos repetitivos, Licensed conveyancercomo correr. Participa en deportes en los que hay riesgo de lesiones o cadas, como ftbol americano, artes marciales o esqu. Cules son los signos o sntomas? Los sntomas pueden aparecer y Geneticist, moleculardesaparecer y,  con frecuencia, suelen incluir los siguientes: Dolor en la zona de la cadera o la ingle. El dolor puede intensificarse con el movimiento. Sensibilidad e hinchazn en la cadera. En casos poco frecuentes, la bolsa sinovial puede infectarse. Si esto sucede, puede tener fiebre, as como calor y enrojecimiento en la zona de la cadera. Cmo se diagnostica? Esta afeccin se puede diagnosticar en funcin de lo siguiente: Los sntomas. Sus antecedentes mdicos. Un examen fsico. Pruebas de diagnstico por imgenes, por ejemplo: Radiografas para examinar los huesos. Una resonancia magntica (RM) o una ecografa para examinar los tendones y los msculos. Gammagrafa sea. Cmo se trata? El tratamiento de esta afeccin incluye hacer reposo, aplicar hielo, aplicar presin (compresin), levantar (elevar) la zona lesionada. Esto se denomina tratamiento de RHCE (reposo, hielo, compresin, elevacin). En algunos casos, el tratamiento RHCE puede no ser suficiente para que los sntomas desaparezcan. El tratamiento tambin puede incluir lo siguiente: Tourist information centre managerUsar muletas, un bastn o un andador para Engineer, wateraliviar la sobrecarga en la cadera. Tomar medicamentos para Technical sales engineeraliviar la hinchazn y Chief Technology Officerel dolor. Aplicarse una inyeccin de cortisona cerca de la zona afectada para reducir la hinchazn y Chief Technology Officerel dolor. Tomar antibiticos si hay infeccin. Extraer el lquido de la bolsa sinovial para Paramedicaliviar la hinchazn y Chief Technology Officerel dolor. Someterse a una ciruga para extraer una bolsa sinovial infectada o daada. Esto es poco frecuente. El tratamiento a largo plazo puede incluir lo siguiente: Hacer ejercicios de fisioterapia para la fuerza y la flexibilidad. Identificar la causa de la bursitis para prevenir episodios futuros. Hacer cambios en el estilo de vida, como bajar de [place]  para reducir el esfuerzo de la cadera. Siga estas instrucciones en su casa: Control del dolor, la rigidez y la hinchazn     Si se lo indican, aplique hielo sobre la  zona afectada. Para hacer esto: Ponga el hielo en una bolsa plstica. Coloque una toalla entre la piel y Copy. Aplique el hielo durante 20 minutos, 2 a 3 veces por da. Retire el hielo si la piel se pone de color rojo brillante. Esto es [employer]. Si no puede sentir dolor, calor o fro, tiene un mayor riesgo de que se dae la zona. Eleve la cadera tanto como sea posible sin que Stage manager. Para hacerlo, pngase una almohada debajo de las caderas mientras est Bear. Si se lo indican, aplique calor en la zona afectada con la frecuencia que le haya indicado el mdico. Use la fuente de calor que el mdico le recomiende, como una compresa de calor hmedo o una almohadilla trmica. Coloque una toalla entre la piel y la fuente de Airline pilot. Aplique calor durante 20 a 30 minutos. Retire la fuente de calor si la piel se pone de color rojo brillante. Esto es especialmente importante si no puede sentir dolor, calor o fro. Puede correr un riesgo mayor de sufrir quemaduras. Actividad No apoye el peso del cuerpo sobre la cadera hasta tanto el mdico lo autorice. Use muletas, un bastn o un andador como se lo haya indicado el mdico. Si la pierna afectada es la que [place] para Science writer, pregntele al mdico si es seguro que conduzca. Haga reposo y protjase la cadera tanto como sea posible hasta que el dolor y la hinchazn mejoren. Retome sus actividades normales segn lo indicado por el mdico. Pregntele al mdico qu actividades son seguras para usted. Haga ejercicio como se lo haya indicado el mdico. Instrucciones generales Use los medicamentos de venta libre y los recetados solamente como se lo haya indicado el mdico. [place] y estire suavemente la zona de la lesin tan frecuentemente como le resulte cmodo. Use las vendas de compresin solamente como se lo haya indicado el mdico. Si tiene una pierna ms corta que la otra, hgase tomar las medidas para una plantilla o un aparato ortopdico. El  mdico o el fisioterapeuta pueden indicarle dnde Clinical research associate estos artculos y qu tamao necesita. Mantenga un peso saludable. Siga las instrucciones de su mdico con respecto al control de su peso. Estas pueden incluir restricciones en la dieta. Concurra a todas las visitas de seguimiento. Esto es importante. Cmo se previene? Haga actividad fsica habitualmente o como se lo haya indicado el mdico. Use calzado que tenga el soporte correcto y sea adecuado para su actividad deportiva y las tareas diarias. Precaliente y elongue adecuadamente antes de hacer actividad fsica. Reljese y elongue despus de hacer actividad fsica. Haga pausas con regularidad durante una actividad repetitiva. Si una actividad le causa irritacin o dolor en la cadera, evtela tanto como sea posible. Evite estar sentado [employer]. Dnde buscar ms informacin American Academy of Orthopaedic Surgeons (Academia Estadounidense de Cirujanos Ortopdicos): orthoinfo.aaos.org Comunquese con un mdico si: Tiene fiebre. Tiene nuevos sntomas. Tiene dificultad para caminar o [employer] cotidianas. Tiene un dolor que empeora o que no mejora con los medicamentos. Tiene la piel enrojecida o sensacin de calor en la zona de la cadera. Solicite ayuda de inmediato si: No puede mover la cadera. Siente dolor intenso. No puede controlar los [employer]. Resumen La bursitis en la cadera es la hinchazn de uno o ms  sacos llenos de lquido (bolsas sinoviales) en la articulacin de la cadera. La bursitis en la cadera puede causar dolor en la cadera o en la ingle, y los sntomas suelen aparecer y Geneticist, molecular a lo largo del [place]. El tratamiento de esta afeccin con frecuencia incluye hacer reposo, aplicar hielo, aplicar presin (compresin), levantar (elevar) la zona lesionada. Es posible que se necesiten otros tratamientos. Esta informacin no tiene Theme park manager el consejo del mdico.  Asegrese de hacerle al mdico cualquier pregunta que tenga. Document Revised: 05/21/2021 Document Reviewed: 05/21/2021 Elsevier Patient Education  2023 [employer].

## 2021-10-08 ENCOUNTER — Telehealth: Payer: Self-pay

## 2021-10-08 NOTE — Telephone Encounter (Signed)
Pt has request Rx for her appt/ pt uses [facility].

## 2021-10-11 ENCOUNTER — Other Ambulatory Visit: Payer: Self-pay

## 2021-10-11 ENCOUNTER — Other Ambulatory Visit: Payer: Self-pay | Admitting: Physical Medicine and Rehabilitation

## 2021-10-11 MED ORDER — DIAZEPAM 5 MG PO TABS
ORAL_TABLET | ORAL | 0 refills | Status: DC
  Filled 2021-10-11: qty 2, 2d supply, fill #0
  Filled 2021-11-16: qty 2, 1d supply, fill #0

## 2021-10-18 ENCOUNTER — Other Ambulatory Visit: Payer: Self-pay

## 2021-10-22 ENCOUNTER — Other Ambulatory Visit: Payer: Self-pay

## 2021-10-27 ENCOUNTER — Ambulatory Visit: Payer: Self-pay | Admitting: Specialist

## 2021-10-29 ENCOUNTER — Telehealth (HOSPITAL_BASED_OUTPATIENT_CLINIC_OR_DEPARTMENT_OTHER): Payer: Self-pay

## 2021-10-29 NOTE — Telephone Encounter (Signed)
[employer] faxed notice of expired order. Order placed 10/26/20. Patient did not complete.

## 2021-11-01 ENCOUNTER — Telehealth: Payer: Self-pay | Admitting: Physical Medicine and Rehabilitation

## 2021-11-01 ENCOUNTER — Other Ambulatory Visit (HOSPITAL_COMMUNITY): Payer: Self-pay

## 2021-11-01 ENCOUNTER — Encounter: Payer: Self-pay | Admitting: Physical Medicine and Rehabilitation

## 2021-11-01 NOTE — Telephone Encounter (Signed)
Patient's son in law [place] called advised patient will need to R/S her appointment. Patient is not feeling well. The number to contact [facility] is [phone]

## 2021-11-04 ENCOUNTER — Other Ambulatory Visit: Payer: Self-pay | Admitting: Nurse Practitioner

## 2021-11-04 DIAGNOSIS — Z1231 Encounter for screening mammogram for malignant neoplasm of breast: Secondary | ICD-10-CM

## 2021-11-16 ENCOUNTER — Other Ambulatory Visit: Payer: Self-pay

## 2021-12-01 ENCOUNTER — Ambulatory Visit (INDEPENDENT_AMBULATORY_CARE_PROVIDER_SITE_OTHER): Payer: Self-pay | Admitting: Physical Medicine and Rehabilitation

## 2021-12-01 ENCOUNTER — Ambulatory Visit: Payer: Self-pay

## 2021-12-01 ENCOUNTER — Encounter: Payer: Self-pay | Admitting: Physical Medicine and Rehabilitation

## 2021-12-01 VITALS — BP 166/73 | HR 90

## 2021-12-01 DIAGNOSIS — M5416 Radiculopathy, lumbar region: Secondary | ICD-10-CM

## 2021-12-01 MED ORDER — DEXAMETHASONE SODIUM PHOSPHATE 10 MG/ML IJ SOLN
15.0000 mg | Freq: Once | INTRAMUSCULAR | Status: AC
  Administered 2021-12-01: 15 mg

## 2021-12-01 NOTE — Patient Instructions (Signed)

## 2021-12-01 NOTE — Progress Notes (Signed)
Pt state lower back pain and numbness that travels to her right leg. Pt state walking makes the pain worse. Pt state she take over the counter pain meds to ehlp eas eher pain.  Numeric Pain Rating Scale and Functional Assessment Average Pain 8   In the last MONTH (on 0-10 scale) has pain interfered with the following?  1. General activity like being  able to carry out your everyday physical activities such as walking, climbing stairs, carrying groceries, or moving a chair?  Rating(10)   +Driver, -BT, -Dye Allergies.

## 2021-12-06 NOTE — Progress Notes (Signed)
Julia Knight - 71 y.o. female MRN [id number]  Date of birth: 1950-07-09  Office Visit Note: Visit Date: 12/01/2021 PCP: Tollie Eth, NP Referred by: Tollie Eth, NP  Subjective: Chief Complaint  Patient presents with   Lower Back - Pain   Right Leg - Pain, Numbness   HPI:  Julia Knight is a 71 y.o. female who comes in today at the request of Dr. [name] for planned Right L2-3 and L3-4 Lumbar Transforaminal epidural steroid injection with fluoroscopic guidance.  The patient has failed conservative care including home exercise, medications, time and activity modification.  This injection will be diagnostic and hopefully therapeutic.  Please see requesting physician notes for further details and justification.  Dr. Barbaraann Faster requesting referral stage left-sided injection but her pain is right-sided.   ROS Otherwise per HPI.  Assessment & Plan: Visit Diagnoses:    ICD-10-CM   1. Lumbar radiculopathy  M54.16 XR C-ARM NO REPORT    Epidural Steroid injection    dexamethasone (DECADRON) injection 15 mg      Plan: No additional findings.   Meds & Orders:  Meds ordered this encounter  Medications   dexamethasone (DECADRON) injection 15 mg    Orders Placed This Encounter  Procedures   XR C-ARM NO REPORT   Epidural Steroid injection    Follow-up: Return for visit to requesting provider as needed.   Procedures: No procedures performed  Lumbosacral Transforaminal Epidural Steroid Injection - Sub-Pedicular Approach with Fluoroscopic Guidance  Patient: Julia Knight      Date of Birth: 1951-03-12 MRN: [id number] PCP: Tollie Eth, NP      Visit Date: 12/01/2021   Universal Protocol:    Date/Time: 12/01/2021  Consent Given By: the patient  Position: PRONE  Additional Comments: Vital signs were monitored before and after the procedure. Patient was prepped and draped in the usual sterile fashion. The correct patient,  procedure, and site was verified.   Injection Procedure Details:   Procedure diagnoses: Lumbar radiculopathy [M54.16]    Meds Administered:  Meds ordered this encounter  Medications   dexamethasone (DECADRON) injection 15 mg    Laterality: Right  Location/Site: L2 and L3  Needle:5.0 in., 22 ga.  Short bevel or Quincke spinal needle  Needle Placement: Transforaminal  Findings:    -Comments: Excellent flow of contrast along the nerve, nerve root and into the epidural space.  Procedure Details: After squaring off the end-plates to get a true AP view, the C-arm was positioned so that an oblique view of the foramen as noted above was visualized. The target area is just inferior to the "nose of the scotty dog" or sub pedicular. The soft tissues overlying this structure were infiltrated with 2-3 ml. of 1% Lidocaine without Epinephrine.  The spinal needle was inserted toward the target using a "trajectory" view along the fluoroscope beam.  Under AP and lateral visualization, the needle was advanced so it did not puncture dura and was located close the 6 O'Clock position of the pedical in AP tracterory. Biplanar projections were used to confirm position. Aspiration was confirmed to be negative for CSF and/or blood. A 1-2 ml. volume of Isovue-250 was injected and flow of contrast was noted at each level. Radiographs were obtained for documentation purposes.   After attaining the desired flow of contrast documented above, a 0.5 to 1.0 ml test dose of 0.25% Marcaine was injected into each respective transforaminal space.  The patient was observed for 90 seconds post  injection.  After no sensory deficits were reported, and normal lower extremity motor function was noted,   the above injectate was administered so that equal amounts of the injectate were placed at each foramen (level) into the transforaminal epidural space.   Additional Comments:  The patient tolerated the procedure  well Dressing: 2 x 2 sterile gauze and Band-Aid    Post-procedure details: Patient was observed during the procedure. Post-procedure instructions were reviewed.  Patient left the clinic in stable condition.    Clinical History: No specialty comments available.     Objective:  VS:  HT:    WT:   BMI:     BP:(!) 166/73  HR:90bpm  TEMP: ( )  RESP:  Physical Exam Vitals and nursing note reviewed.  Constitutional:      General: She is not in acute distress.    Appearance: Normal appearance. She is not ill-appearing.  HENT:     Head: Normocephalic and atraumatic.     Right Ear: External ear normal.     Left Ear: External ear normal.  Eyes:     Extraocular Movements: Extraocular movements intact.  Cardiovascular:     Rate and Rhythm: Normal rate.     Pulses: Normal pulses.  Pulmonary:     Effort: Pulmonary effort is normal. No respiratory distress.  Abdominal:     General: There is no distension.     Palpations: Abdomen is soft.  Musculoskeletal:        General: Tenderness present.     Cervical back: Neck supple.     Right lower leg: No edema.     Left lower leg: No edema.     Comments: Patient has good distal strength with no pain over the greater trochanters.  No clonus or focal weakness.  Skin:    Findings: No erythema, lesion or rash.  Neurological:     General: No focal deficit present.     Mental Status: She is alert and oriented to person, place, and time.     Sensory: No sensory deficit.     Motor: No weakness or abnormal muscle tone.     Coordination: Coordination normal.  Psychiatric:        Mood and Affect: Mood normal.        Behavior: Behavior normal.      Imaging: No results found.

## 2021-12-06 NOTE — Procedures (Signed)
Lumbosacral Transforaminal Epidural Steroid Injection - Sub-Pedicular Approach with Fluoroscopic Guidance  Patient: Julia Knight      Date of Birth: 16-Mar-1951 MRN: [id number] PCP: Tollie Eth, NP      Visit Date: 12/01/2021   Universal Protocol:    Date/Time: 12/01/2021  Consent Given By: the patient  Position: PRONE  Additional Comments: Vital signs were monitored before and after the procedure. Patient was prepped and draped in the usual sterile fashion. The correct patient, procedure, and site was verified.   Injection Procedure Details:   Procedure diagnoses: Lumbar radiculopathy [M54.16]    Meds Administered:  Meds ordered this encounter  Medications   dexamethasone (DECADRON) injection 15 mg    Laterality: Right  Location/Site: L2 and L3  Needle:5.0 in., 22 ga.  Short bevel or Quincke spinal needle  Needle Placement: Transforaminal  Findings:    -Comments: Excellent flow of contrast along the nerve, nerve root and into the epidural space.  Procedure Details: After squaring off the end-plates to get a true AP view, the C-arm was positioned so that an oblique view of the foramen as noted above was visualized. The target area is just inferior to the "nose of the scotty dog" or sub pedicular. The soft tissues overlying this structure were infiltrated with 2-3 ml. of 1% Lidocaine without Epinephrine.  The spinal needle was inserted toward the target using a "trajectory" view along the fluoroscope beam.  Under AP and lateral visualization, the needle was advanced so it did not puncture dura and was located close the 6 O'Clock position of the pedical in AP tracterory. Biplanar projections were used to confirm position. Aspiration was confirmed to be negative for CSF and/or blood. A 1-2 ml. volume of Isovue-250 was injected and flow of contrast was noted at each level. Radiographs were obtained for documentation purposes.   After attaining the desired  flow of contrast documented above, a 0.5 to 1.0 ml test dose of 0.25% Marcaine was injected into each respective transforaminal space.  The patient was observed for 90 seconds post injection.  After no sensory deficits were reported, and normal lower extremity motor function was noted,   the above injectate was administered so that equal amounts of the injectate were placed at each foramen (level) into the transforaminal epidural space.   Additional Comments:  The patient tolerated the procedure well Dressing: 2 x 2 sterile gauze and Band-Aid    Post-procedure details: Patient was observed during the procedure. Post-procedure instructions were reviewed.  Patient left the clinic in stable condition.

## 2021-12-10 ENCOUNTER — Ambulatory Visit: Payer: Self-pay

## 2021-12-22 ENCOUNTER — Ambulatory Visit: Payer: Self-pay

## 2022-01-05 ENCOUNTER — Ambulatory Visit: Payer: Self-pay

## 2022-01-06 ENCOUNTER — Other Ambulatory Visit: Payer: Self-pay

## 2022-01-06 ENCOUNTER — Encounter: Payer: Self-pay | Admitting: Specialist

## 2022-01-06 ENCOUNTER — Ambulatory Visit (INDEPENDENT_AMBULATORY_CARE_PROVIDER_SITE_OTHER): Payer: Self-pay | Admitting: Specialist

## 2022-01-06 VITALS — BP 162/77 | HR 79 | Ht 60.0 in | Wt 180.0 lb

## 2022-01-06 DIAGNOSIS — M5442 Lumbago with sciatica, left side: Secondary | ICD-10-CM

## 2022-01-06 DIAGNOSIS — R29898 Other symptoms and signs involving the musculoskeletal system: Secondary | ICD-10-CM

## 2022-01-06 DIAGNOSIS — M48062 Spinal stenosis, lumbar region with neurogenic claudication: Secondary | ICD-10-CM

## 2022-01-06 DIAGNOSIS — I739 Peripheral vascular disease, unspecified: Secondary | ICD-10-CM

## 2022-01-06 DIAGNOSIS — E1351 Other specified diabetes mellitus with diabetic peripheral angiopathy without gangrene: Secondary | ICD-10-CM

## 2022-01-06 DIAGNOSIS — G8929 Other chronic pain: Secondary | ICD-10-CM

## 2022-01-06 DIAGNOSIS — M5416 Radiculopathy, lumbar region: Secondary | ICD-10-CM

## 2022-01-06 DIAGNOSIS — Z9889 Other specified postprocedural states: Secondary | ICD-10-CM

## 2022-01-06 DIAGNOSIS — Z981 Arthrodesis status: Secondary | ICD-10-CM

## 2022-01-06 MED ORDER — TRAMADOL-ACETAMINOPHEN 37.5-325 MG PO TABS
1.0000 | ORAL_TABLET | Freq: Four times a day (QID) | ORAL | 0 refills | Status: DC | PRN
  Filled 2022-01-06: qty 30, 8d supply, fill #0

## 2022-01-06 MED ORDER — GABAPENTIN 300 MG PO CAPS
ORAL_CAPSULE | ORAL | 3 refills | Status: DC
  Filled 2022-01-06: qty 90, 30d supply, fill #0

## 2022-01-06 NOTE — Patient Instructions (Signed)
Plan: Avoid bending, stooping and avoid lifting weights greater than 10 lbs. Avoid prolong standing and walking. Avoid frequent bending and stooping  No lifting greater than 10 lbs. May use ice or moist heat for pain. Weight loss is of benefit. Handicap license is approved. Referral to neurology due to diabetes and persistant leg weakness post op laminectomies and fusion surgery. Referral for noninvasive vascular testing due to ongoing claudication symptoms post laminectomy with  History of diabetes and diminished pulses in the legs.

## 2022-01-06 NOTE — Progress Notes (Signed)
Office Visit Note   Patient: Julia Knight           Date of Birth: 22-Dec-1950           MRN: [id number] Visit Date: 01/06/2022              Requested by: Tollie Eth, NP [address] [place],  [place] [address] PCP: Tollie Eth, NP   Assessment & Plan: Visit Diagnoses:  1. Spinal stenosis of lumbar region with neurogenic claudication   2. S/P lumbar laminectomy   3. Weakness of right leg   4. Radiculopathy, lumbar region   5. Lumbar radiculopathy   6. S/P lumbar fusion   7. Peripheral vascular disease due to secondary diabetes (HCC)   8. Claudication of both lower extremities (HCC)     Plan: Avoid bending, stooping and avoid lifting weights greater than 10 lbs. Avoid prolong standing and walking. Avoid frequent bending and stooping  No lifting greater than 10 lbs. May use ice or moist heat for pain. Weight loss is of benefit. Handicap license is approved. Referral to neurology due to diabetes and persistant leg weakness post op laminectomies and fusion surgery. Referral for noninvasive vascular testing due to ongoing claudication symptoms post laminectomy with  History of diabetes and diminished pulses in the legs.   Follow-Up Instructions: Return in about 4 weeks (around 02/03/2022).   Orders:  Orders Placed This Encounter  Procedures   Ambulatory referral to Neurology   VAS [place] PAD ABI   No orders of the defined types were placed in this encounter.     Procedures: No procedures performed   Clinical Data: No additional findings.   Subjective: Chief Complaint  Patient presents with   Lower Back - Pain    71 year old female with history of long lumbar decompressive laminectomy L2 to L5 with fusion TLIFs at L4-5 and L5-S1    Review of Systems  Constitutional: Negative.   HENT: Negative.    Eyes: Negative.   Respiratory: Negative.    Cardiovascular: Negative.   Gastrointestinal: Negative.   Endocrine: Negative.    Genitourinary: Negative.   Musculoskeletal: Negative.   Skin: Negative.   Allergic/Immunologic: Negative.   Neurological: Negative.   Hematological: Negative.   Psychiatric/Behavioral: Negative.       Objective: Vital Signs: BP (!) 162/77 (BP Location: Left Arm, Patient Position: Sitting, Cuff Size: Large)   Pulse 79   Ht 5' (1.524 m)   Wt 180 lb (81.6 kg)   BMI 35.15 kg/m   Physical Exam Constitutional:      Appearance: She is well-developed.  HENT:     Head: Normocephalic and atraumatic.  Eyes:     Pupils: Pupils are equal, round, and reactive to light.  Pulmonary:     Effort: Pulmonary effort is normal.     Breath sounds: Normal breath sounds.  Abdominal:     General: Bowel sounds are normal.     Palpations: Abdomen is soft.  Musculoskeletal:     Cervical back: Normal range of motion and neck supple.  Skin:    General: Skin is warm and dry.  Neurological:     Mental Status: She is alert and oriented to person, place, and time.  Psychiatric:        Behavior: Behavior normal.        Thought Content: Thought content normal.        Judgment: Judgment normal.     Back Exam  Tenderness  The patient is experiencing tenderness in the lumbar.  Range of Motion  Extension:  abnormal  Flexion:  abnormal  Lateral bend right:  abnormal  Lateral bend left:  abnormal  Rotation right:  abnormal  Rotation left:  abnormal   Muscle Strength  Right Quadriceps:  5/5  Left Quadriceps:  5/5  Right Hamstrings:  5/5  Left Hamstrings:  5/5   Reflexes  Patellar:  1/4 Achilles:  0/4  Other  Sensation: decreased  Comments:  There is decreased sensation on the right dorsal foot and lateral right foot.      Specialty Comments:  No specialty comments available.  Imaging: No results found.   PMFS History: Patient Active Problem List   Diagnosis Date Noted   Postoperative anemia due to acute blood loss 09/03/2020    Priority: High    Class: Acute    Spondylolisthesis at L4-L5 level 09/01/2020    Priority: High    Class: Chronic   Nonalcoholic hepatosteatosis 03/29/2021   Pain of upper abdomen 03/19/2021   Nausea 03/19/2021   Hematemesis with nausea 03/19/2021   Sore throat 03/19/2021   Sinobronchitis 03/19/2021   Pressure injury of sacral region, stage 2 (HCC) 09/18/2020   Spinal stenosis, 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   Status post lumbar spinal fusion 09/01/2020   Hyperlipidemia 07/24/2020   BMI 34.0-34.9,adult 05/22/2020   Spondylolisthesis at L3-L4 level 05/22/2020   Anxiety 06/07/2018   GERD (gastroesophageal reflux disease) 06/03/2013   Left-sided low back pain with sciatica 10/09/2012   Left leg pain 08/14/2012   Depression 03/08/2012   Primary hypertension 08/24/2011   T2DM (type 2 diabetes mellitus) (HCC) 02/28/2011   Past Medical History:  Diagnosis Date   Acute blood loss anemia 09/10/2020   Allergic rhinitis 08/24/2011   Allergy    Anxiety    Arthritis    back and hands   Constipation due to opioid therapy 09/18/2020   COVID-19 11/16/2018   Depression    Diabetes mellitus without complication (HCC)    type 2   Dizziness 08/16/2017   Encounter for medical examination to establish care 07/24/2020   Gastroenteritis, infectious, presumed 08/16/2017   GERD (gastroesophageal reflux disease)    Glossitis 09/10/2020   Hyperlipemia    Hypertension    Neuromuscular disorder (HCC)     History reviewed. No pertinent family history.  Past Surgical History:  Procedure Laterality Date   APPENDECTOMY     CHOLECYSTECTOMY     TONSILLECTOMY     removed as an adult   Social History   Occupational History   Not on file  Tobacco Use   Smoking status: Former    Types: Cigarettes   Smokeless tobacco: Never  Vaping Use   Vaping Use: Never used  Substance and Sexual Activity   Alcohol use: Never   Drug use: Never   Sexual activity: Not Currently

## 2022-01-07 ENCOUNTER — Other Ambulatory Visit: Payer: Self-pay

## 2022-01-12 ENCOUNTER — Other Ambulatory Visit: Payer: Self-pay

## 2022-01-24 ENCOUNTER — Ambulatory Visit (HOSPITAL_BASED_OUTPATIENT_CLINIC_OR_DEPARTMENT_OTHER): Payer: Self-pay | Admitting: Nurse Practitioner

## 2022-01-25 ENCOUNTER — Ambulatory Visit (INDEPENDENT_AMBULATORY_CARE_PROVIDER_SITE_OTHER): Payer: Self-pay | Admitting: Nurse Practitioner

## 2022-01-25 ENCOUNTER — Encounter (HOSPITAL_BASED_OUTPATIENT_CLINIC_OR_DEPARTMENT_OTHER): Payer: Self-pay | Admitting: Nurse Practitioner

## 2022-01-25 ENCOUNTER — Ambulatory Visit (HOSPITAL_COMMUNITY)
Admission: RE | Admit: 2022-01-25 | Discharge: 2022-01-25 | Disposition: A | Discharge status: Home / Self Care | Payer: Self-pay | Source: Ambulatory Visit | Attending: Cardiology | Admitting: Cardiology

## 2022-01-25 ENCOUNTER — Other Ambulatory Visit: Payer: Self-pay

## 2022-01-25 ENCOUNTER — Other Ambulatory Visit (HOSPITAL_BASED_OUTPATIENT_CLINIC_OR_DEPARTMENT_OTHER): Payer: Self-pay

## 2022-01-25 VITALS — BP 140/67 | HR 96 | Ht 60.0 in | Wt 185.7 lb

## 2022-01-25 DIAGNOSIS — E1351 Other specified diabetes mellitus with diabetic peripheral angiopathy without gangrene: Secondary | ICD-10-CM | POA: Insufficient documentation

## 2022-01-25 DIAGNOSIS — K920 Hematemesis: Secondary | ICD-10-CM

## 2022-01-25 DIAGNOSIS — R1010 Upper abdominal pain, unspecified: Secondary | ICD-10-CM

## 2022-01-25 DIAGNOSIS — J0140 Acute pansinusitis, unspecified: Secondary | ICD-10-CM

## 2022-01-25 DIAGNOSIS — K5903 Drug induced constipation: Secondary | ICD-10-CM

## 2022-01-25 DIAGNOSIS — E559 Vitamin D deficiency, unspecified: Secondary | ICD-10-CM

## 2022-01-25 DIAGNOSIS — R110 Nausea: Secondary | ICD-10-CM

## 2022-01-25 DIAGNOSIS — I10 Essential (primary) hypertension: Secondary | ICD-10-CM

## 2022-01-25 DIAGNOSIS — E782 Mixed hyperlipidemia: Secondary | ICD-10-CM

## 2022-01-25 DIAGNOSIS — J029 Acute pharyngitis, unspecified: Secondary | ICD-10-CM

## 2022-01-25 DIAGNOSIS — I739 Peripheral vascular disease, unspecified: Secondary | ICD-10-CM | POA: Insufficient documentation

## 2022-01-25 DIAGNOSIS — E1165 Type 2 diabetes mellitus with hyperglycemia: Secondary | ICD-10-CM

## 2022-01-25 DIAGNOSIS — T402X5A Adverse effect of other opioids, initial encounter: Secondary | ICD-10-CM

## 2022-01-25 MED ORDER — LISINOPRIL 20 MG PO TABS
20.0000 mg | ORAL_TABLET | Freq: Every day | ORAL | 6 refills | Status: DC
  Filled 2022-01-25: qty 30, 30d supply, fill #0

## 2022-01-25 MED ORDER — POLYETHYLENE GLYCOL 3350 17 G PO PACK
17.0000 g | PACK | Freq: Two times a day (BID) | ORAL | 6 refills | Status: DC
  Filled 2022-01-25: qty 30, 15d supply, fill #0

## 2022-01-25 MED ORDER — AZITHROMYCIN 250 MG PO TABS
ORAL_TABLET | ORAL | 0 refills | Status: AC
  Filled 2022-01-25: qty 6, 5d supply, fill #0

## 2022-01-25 MED ORDER — LISINOPRIL 20 MG PO TABS
20.0000 mg | ORAL_TABLET | Freq: Every day | ORAL | 3 refills | Status: DC
  Filled 2022-02-01: qty 90, 90d supply, fill #0
  Filled 2022-07-12 – 2022-10-07 (×2): qty 90, 90d supply, fill #1

## 2022-01-25 MED ORDER — SUCRALFATE 1 G PO TABS
1.0000 g | ORAL_TABLET | Freq: Four times a day (QID) | ORAL | 3 refills | Status: DC
  Filled 2022-01-25: qty 120, 30d supply, fill #0

## 2022-01-25 MED ORDER — ATORVASTATIN CALCIUM 40 MG PO TABS
40.0000 mg | ORAL_TABLET | Freq: Every day | ORAL | 11 refills | Status: DC
  Filled 2022-01-25: qty 30, 30d supply, fill #0

## 2022-01-25 MED ORDER — AZITHROMYCIN 250 MG PO TABS
ORAL_TABLET | ORAL | 0 refills | Status: DC
  Filled 2022-01-25 (×2): qty 6, 5d supply, fill #0

## 2022-01-25 MED ORDER — SUCRALFATE 1 G PO TABS: 1.0000 g | ORAL_TABLET | Freq: Four times a day (QID) | ORAL | 3 refills | Status: DC

## 2022-01-25 MED ORDER — METFORMIN HCL 1000 MG PO TABS
1000.0000 mg | ORAL_TABLET | Freq: Two times a day (BID) | ORAL | 11 refills | Status: DC
  Filled 2022-01-25: qty 60, 30d supply, fill #0

## 2022-01-25 MED ORDER — ATORVASTATIN CALCIUM 40 MG PO TABS
40.0000 mg | ORAL_TABLET | Freq: Every day | ORAL | 3 refills | Status: DC
  Filled 2022-02-01: qty 90, 90d supply, fill #0
  Filled 2022-07-12 – 2022-10-07 (×2): qty 90, 90d supply, fill #1

## 2022-01-25 MED ORDER — PANTOPRAZOLE SODIUM 40 MG PO TBEC
40.0000 mg | DELAYED_RELEASE_TABLET | Freq: Two times a day (BID) | ORAL | 3 refills | Status: DC
  Filled 2022-01-25: qty 180, 90d supply, fill #0

## 2022-01-25 MED ORDER — METFORMIN HCL 1000 MG PO TABS
1000.0000 mg | ORAL_TABLET | Freq: Two times a day (BID) | ORAL | 3 refills | Status: DC
  Filled 2022-07-12 – 2022-07-21 (×2): qty 180, 90d supply, fill #0

## 2022-01-25 MED ORDER — PANTOPRAZOLE SODIUM 40 MG PO TBEC
40.0000 mg | DELAYED_RELEASE_TABLET | Freq: Two times a day (BID) | ORAL | 3 refills | Status: DC
  Filled 2022-01-25 (×2): qty 60, 30d supply, fill #0

## 2022-01-25 NOTE — Assessment & Plan Note (Signed)
Erythema to throat with evidence of posterior oropharynx drainage. Sinus pressure present as well. Will send treatment for sinusitis and monitor closely.

## 2022-01-25 NOTE — Assessment & Plan Note (Signed)
See hematemesis

## 2022-01-25 NOTE — Assessment & Plan Note (Signed)
CHronic. On statin. Labs today.

## 2022-01-25 NOTE — Assessment & Plan Note (Signed)
Patient has not heard from GI since referral last fall. New referral sent to different practice today.  Will go ahead and send pantoprazole and carafate refills as these have been helpful for symptom management in the past. Given her history of hematemesis I do feel that referral needs to be completed sooner rather than later for optimal health management. Family instructed to contact the office if she has not heard from them in the next 2 weeks. Warnings for immediate evaluation provided.

## 2022-01-25 NOTE — Patient Instructions (Addendum)
It was a pleasure seeing you today. I hope your time spent with [place] was pleasant and helpful. Please let us know if there is anything we can do to improve the service you receive.    I have sent your medications to the pharmacy.  I have sent the referral to the GI doctor- if you do not hear from them in 2 weeks I want you to let me know- please just call and tell us.   The following orders have been placed for you today:  Orders Placed This Encounter  Procedures   Ambulatory referral to Gastroenterology    Referral Priority:   Routine    Referral Type:   Consultation    Referral Reason:   Specialty Services Required    Number of Visits Requested:   1     Important Office Information Lab Results If labs were ordered, please note that you will see results through MyChart as soon as they come available from [facility].  It takes up to 5 business days for the results to be routed to me and for me to review them once all of the lab results have come through from [facility]. I will make recommendations based on your results and send these through MyChart or someone from the office will call you to discuss. If your labs are abnormal, we may contact you to schedule a visit to discuss the results and make recommendations.  If you have not heard from [place] within 5 business days or you have waited longer than a week and your lab results have not come through on MyChart, please feel free to call the office or send a message through MyChart to follow-up on these labs.   Referrals If referrals were placed today, the office where the referral was sent will contact you either by phone or through MyChart to set up scheduling. Please note that it can take up to a week for the referral office to contact you. If you do not hear from them in a week, please contact the referral office directly to inquire about scheduling.   Condition Treated If your condition worsens or you begin to have new symptoms, please schedule a  follow-up appointment for further evaluation. If you are not sure if an appointment is needed, you may call the office to leave a message for the nurse and someone will contact you with recommendations.  If you have an urgent or life threatening emergency, please do not call the office, but seek emergency evaluation by calling 911 or going to the nearest emergency room for evaluation.   MyChart and Phone Calls Please do not use MyChart for urgent messages. It may take up to 3 business days for MyChart messages to be read by staff and if they are unable to handle the request, an additional 3 business days for them to be routed to me and for my response.  Messages sent to the provider through MyChart do not come directly to the provider, please allow time for these messages to be routed and for me to respond.  We get a large volume of MyChart messages daily and these are responded to in the order received.   For urgent messages, please call the office at [phone] and speak with the front office staff or leave a message on the line of my assistant for guidance.  We are seeing patients from the hours of 8:00 am through 5:00 pm and calls directly to the nurse may not be answered  immediately due to seeing patients, but your call will be returned as soon as possible.  Phone  messages received after 4:00 PM Monday through Thursday may not be returned until the following business day. Phone messages received after 11:00 AM on Friday may not be returned until Monday.   After Hours We share on call hours with providers from other offices. If you have an urgent need after hours that cannot wait until the next business day, please contact the on call provider by calling the office number. A nurse will speak with you and contact the provider if needed for recommendations.  If you have an urgent or life threatening emergency after hours, please do not call the on call provider, but seek emergency evaluation by  calling 911 or going to the nearest emergency room for evaluation.   Paperwork All paperwork requires a minimum of 5 days to complete and return to you or the designated personnel. Please keep this in mind when bringing in forms or sending requests for paperwork completion to the office.

## 2022-01-25 NOTE — Assessment & Plan Note (Signed)
Epigastric pain with tenderness. Reduced oral intake due to severe pain. Will restart carafate and pantoprazole.

## 2022-01-25 NOTE — Assessment & Plan Note (Signed)
Chronic. No changes to medications today. We will obtain labs for monitoring.

## 2022-01-25 NOTE — Assessment & Plan Note (Signed)
Chronic. BP elevated in the office today, however, she is feeling unwell. No symptoms. Recommend continuation of current medication. Labs today. BP goal <140/80

## 2022-01-25 NOTE — Progress Notes (Signed)
Shawna Clamp, DNP, AGNP-c [facility] [address] [place], [place] [address] [phone] Office [phone] Fax  ESTABLISHED PATIENT- Chronic Health and/or Follow-Up Visit  Blood pressure (!) 140/67, pulse 96, height 5' (1.524 m), weight 185 lb 11.2 oz (84.2 kg), SpO2 97 %.    Julia Knight is a 71 y.o. year old female presenting today for evaluation and management of the following: Follow-up (Pt here for f/u on DM and she also stated she has a sore throat as well, she also need refills on some of her medications )  Sore Throat - Hebe endorses sore throat, ear pain, and right sided sinus pressure with fever for the last several days.   Abdominal Pain - She endorses ongoing concerns with epigastric pain, burning, and tenderness. She has not heard from the GI referral sent several months ago. She reports that her symptoms were improved when she was taking BID pantoprazole and daily carafate. She has run out of this medication and her pain has returned. She endorses difficulty eating to the point that she does not eat for several days at a time due to her pain. She has not had any hematemesis.   DM - She has been working on DM management with diet and medication. She is taking medication as prescribed without missed doses. She denies increased hunger, thirst, or urination.    All ROS negative with exception of what is listed above.   PHYSICAL EXAM Physical Exam Vitals and nursing note reviewed.  Constitutional:      General: She is not in acute distress.    Appearance: Normal appearance.  HENT:     Head: Normocephalic.  Eyes:     Extraocular Movements: Extraocular movements intact.     Conjunctiva/sclera: Conjunctivae normal.     Pupils: Pupils are equal, round, and reactive to light.  Neck:     Vascular: No carotid bruit.  Cardiovascular:     Rate and Rhythm: Normal rate and regular rhythm.     Pulses:  Normal pulses.     Heart sounds: Normal heart sounds. No murmur heard. Pulmonary:     Effort: Pulmonary effort is normal.     Breath sounds: Normal breath sounds. No wheezing.  Abdominal:     General: Bowel sounds are normal. There is no distension.     Palpations: Abdomen is soft.     Tenderness: There is abdominal tenderness. There is guarding.     Comments: Epigastric.   Musculoskeletal:        General: Normal range of motion.     Cervical back: Normal range of motion and neck supple.     Right lower leg: No edema.     Left lower leg: No edema.  Lymphadenopathy:     Cervical: No cervical adenopathy.  Skin:    General: Skin is warm and dry.     Capillary Refill: Capillary refill takes less than 2 seconds.  Neurological:     General: No focal deficit present.     Mental Status: She is alert and oriented to person, place, and time.  Psychiatric:        Mood and Affect: Mood normal.        Behavior: Behavior normal.        Thought Content: Thought content normal.        Judgment: Judgment normal.     PLAN Problem List Items Addressed This Visit     Primary hypertension - Primary  Chronic. BP elevated in the office today, however, she is feeling unwell. No symptoms. Recommend continuation of current medication. Labs today. BP goal <140/80      Relevant Medications   atorvastatin (LIPITOR) 40 MG tablet   lisinopril (ZESTRIL) 20 MG tablet   Other Relevant Orders   CBC with Differential/Platelet   Comprehensive metabolic panel   Hemoglobin A1c   VITAMIN D 25 Hydroxy (Vit-D Deficiency, Fractures)   Hyperlipidemia    CHronic. On statin. Labs today.       Relevant Medications   atorvastatin (LIPITOR) 40 MG tablet   lisinopril (ZESTRIL) 20 MG tablet   Other Relevant Orders   CBC with Differential/Platelet   Comprehensive metabolic panel   Hemoglobin A1c   VITAMIN D 25 Hydroxy (Vit-D Deficiency, Fractures)   T2DM (type 2 diabetes mellitus) (HCC)    Chronic. No  changes to medications today. We will obtain labs for monitoring.       Relevant Medications   atorvastatin (LIPITOR) 40 MG tablet   lisinopril (ZESTRIL) 20 MG tablet   metFORMIN (GLUCOPHAGE) 1000 MG tablet   Other Relevant Orders   CBC with Differential/Platelet   Comprehensive metabolic panel   Hemoglobin A1c   VITAMIN D 25 Hydroxy (Vit-D Deficiency, Fractures)   Pain of upper abdomen    Epigastric pain with tenderness. Reduced oral intake due to severe pain. Will restart carafate and pantoprazole.       Relevant Medications   pantoprazole (PROTONIX) 40 MG tablet   sucralfate (CARAFATE) 1 g tablet   Other Relevant Orders   Ambulatory referral to Gastroenterology   CBC with Differential/Platelet   Comprehensive metabolic panel   Hemoglobin A1c   VITAMIN D 25 Hydroxy (Vit-D Deficiency, Fractures)   Nausea    See hematemesis      Relevant Medications   pantoprazole (PROTONIX) 40 MG tablet   sucralfate (CARAFATE) 1 g tablet   Other Relevant Orders   Ambulatory referral to Gastroenterology   CBC with Differential/Platelet   Comprehensive metabolic panel   Hemoglobin A1c   VITAMIN D 25 Hydroxy (Vit-D Deficiency, Fractures)   Hematemesis with nausea    Patient has not heard from GI since referral last fall. New referral sent to different practice today.  Will go ahead and send pantoprazole and carafate refills as these have been helpful for symptom management in the past. Given her history of hematemesis I do feel that referral needs to be completed sooner rather than later for optimal health management. Family instructed to contact the office if she has not heard from them in the next 2 weeks. Warnings for immediate evaluation provided.       Relevant Medications   pantoprazole (PROTONIX) 40 MG tablet   sucralfate (CARAFATE) 1 g tablet   Other Relevant Orders   CBC with Differential/Platelet   Comprehensive metabolic panel   Hemoglobin A1c   VITAMIN D 25 Hydroxy (Vit-D  Deficiency, Fractures)   Sore throat    Erythema to throat with evidence of posterior oropharynx drainage. Sinus pressure present as well. Will send treatment for sinusitis and monitor closely.       Relevant Medications   pantoprazole (PROTONIX) 40 MG tablet   sucralfate (CARAFATE) 1 g tablet   Other Relevant Orders   Ambulatory referral to Gastroenterology   CBC with Differential/Platelet   Comprehensive metabolic panel   Hemoglobin A1c   VITAMIN D 25 Hydroxy (Vit-D Deficiency, Fractures)   Other Visit Diagnoses     Constipation due to opioid therapy  Relevant Medications   polyethylene glycol (MIRALAX / GLYCOLAX) 17 g packet   Other Relevant Orders   CBC with Differential/Platelet   Comprehensive metabolic panel   Hemoglobin A1c   VITAMIN D 25 Hydroxy (Vit-D Deficiency, Fractures)   Acute non-recurrent pansinusitis       Relevant Medications   azithromycin (ZITHROMAX) 250 MG tablet       Return in about 3 months (around 04/26/2022) for Diabetes.   Shawna Clamp, DNP, AGNP-c 01/25/2022  1:27 PM

## 2022-01-26 ENCOUNTER — Other Ambulatory Visit: Payer: Self-pay

## 2022-01-26 LAB — COMPREHENSIVE METABOLIC PANEL
ALT: 19 IU/L (ref 0–32)
AST: 12 IU/L (ref 0–40)
Albumin/Globulin Ratio: 1.6 (ref 1.2–2.2)
Albumin: 4.7 g/dL (ref 3.9–4.9)
Alkaline Phosphatase: 202 IU/L — ABNORMAL HIGH (ref 44–121)
BUN/Creatinine Ratio: 17 (ref 12–28)
BUN: 20 mg/dL (ref 8–27)
Bilirubin Total: 0.3 mg/dL (ref 0.0–1.2)
CO2: 17 mmol/L — ABNORMAL LOW (ref 20–29)
Calcium: 9.3 mg/dL (ref 8.7–10.3)
Chloride: 95 mmol/L — ABNORMAL LOW (ref 96–106)
Creatinine, Ser: 1.19 mg/dL — ABNORMAL HIGH (ref 0.57–1.00)
Globulin, Total: 2.9 g/dL (ref 1.5–4.5)
Glucose: 204 mg/dL — ABNORMAL HIGH (ref 70–99)
Potassium: 5.3 mmol/L — ABNORMAL HIGH (ref 3.5–5.2)
Sodium: 138 mmol/L (ref 134–144)
Total Protein: 7.6 g/dL (ref 6.0–8.5)
eGFR: 49 mL/min/{1.73_m2} — ABNORMAL LOW (ref >59)

## 2022-01-26 LAB — CBC WITH DIFFERENTIAL/PLATELET
Basophils Absolute: 0.1 10*3/uL (ref 0.0–0.2)
Basos: 1 %
EOS (ABSOLUTE): 0.6 10*3/uL — ABNORMAL HIGH (ref 0.0–0.4)
Eos: 6 %
Hematocrit: 35.9 % (ref 34.0–46.6)
Hemoglobin: 12.5 g/dL (ref 11.1–15.9)
Immature Grans (Abs): 0.1 10*3/uL (ref 0.0–0.1)
Immature Granulocytes: 1 %
Lymphocytes Absolute: 3.9 10*3/uL — ABNORMAL HIGH (ref 0.7–3.1)
Lymphs: 35 %
MCH: 29.7 pg (ref 26.6–33.0)
MCHC: 34.8 g/dL (ref 31.5–35.7)
MCV: 85 fL (ref 79–97)
Monocytes Absolute: 1.2 10*3/uL — ABNORMAL HIGH (ref 0.1–0.9)
Monocytes: 11 %
Neutrophils Absolute: 5.3 10*3/uL (ref 1.4–7.0)
Neutrophils: 46 %
Platelets: 329 10*3/uL (ref 150–450)
RBC: 4.21 x10E6/uL (ref 3.77–5.28)
RDW: 12.2 % (ref 11.7–15.4)
WBC: 11.2 10*3/uL — ABNORMAL HIGH (ref 3.4–10.8)

## 2022-01-26 LAB — VITAMIN D 25 HYDROXY (VIT D DEFICIENCY, FRACTURES): Vit D, 25-Hydroxy: 27.4 ng/mL — ABNORMAL LOW (ref 30.0–100.0)

## 2022-01-26 LAB — HEMOGLOBIN A1C
Est. average glucose Bld gHb Est-mCnc: 246 mg/dL
Hgb A1c MFr Bld: 10.2 % — ABNORMAL HIGH (ref 4.8–5.6)

## 2022-01-26 MED ORDER — VITAMIN D (ERGOCALCIFEROL) 1.25 MG (50000 UNIT) PO CAPS
50000.0000 [IU] | ORAL_CAPSULE | ORAL | 0 refills | Status: DC
  Filled 2022-01-26: qty 12, 84d supply, fill #0

## 2022-01-26 MED ORDER — GLIPIZIDE 5 MG PO TABS
5.0000 mg | ORAL_TABLET | Freq: Two times a day (BID) | ORAL | 3 refills | Status: DC
  Filled 2022-01-26: qty 60, 30d supply, fill #0

## 2022-01-26 NOTE — Addendum Note (Signed)
Addended by: Emilija Bohman, Clarise Cruz E on: 01/26/2022 05:05 PM   Modules accepted: Orders

## 2022-01-27 ENCOUNTER — Other Ambulatory Visit: Payer: Self-pay

## 2022-02-01 ENCOUNTER — Other Ambulatory Visit: Payer: Self-pay | Admitting: Neurology

## 2022-02-01 ENCOUNTER — Ambulatory Visit (INDEPENDENT_AMBULATORY_CARE_PROVIDER_SITE_OTHER): Payer: Self-pay | Admitting: Neurology

## 2022-02-01 ENCOUNTER — Encounter: Payer: Self-pay | Admitting: Neurology

## 2022-02-01 ENCOUNTER — Other Ambulatory Visit: Payer: Self-pay

## 2022-02-01 ENCOUNTER — Ambulatory Visit
Admission: RE | Admit: 2022-02-01 | Discharge: 2022-02-01 | Disposition: A | Discharge status: Home / Self Care | Payer: No Typology Code available for payment source | Source: Ambulatory Visit | Attending: Neurology | Admitting: Neurology

## 2022-02-01 VITALS — BP 130/72 | HR 88 | Ht 60.0 in | Wt 184.0 lb

## 2022-02-01 DIAGNOSIS — M5441 Lumbago with sciatica, right side: Secondary | ICD-10-CM | POA: Insufficient documentation

## 2022-02-01 DIAGNOSIS — M25551 Pain in right hip: Secondary | ICD-10-CM

## 2022-02-01 DIAGNOSIS — G629 Polyneuropathy, unspecified: Secondary | ICD-10-CM

## 2022-02-01 HISTORY — DX: Lumbago with sciatica, right side: M54.41

## 2022-02-01 MED ORDER — DULOXETINE HCL 30 MG PO CPEP
30.0000 mg | ORAL_CAPSULE | Freq: Every day | ORAL | 6 refills | Status: DC
  Filled 2022-02-01: qty 30, 30d supply, fill #0

## 2022-02-01 NOTE — Progress Notes (Signed)
Chief Complaint  Patient presents with   New Patient (Initial Visit)    Room 13, with son and daughter  NP/internal referral for persistent leg weakness post op laminectomies, radiculopathy vs peripheral neuropathy C/o pain in both legs and feet, states it is worse on right side, states sx started 1 year ago after steroid injection in lower back       ASSESSMENT AND PLAN  Julia Knight is a 71 y.o. female   Low back pain, Lower extremity paresthesia  She has length-dependent sensory changes, poorly controlled diabetes, motor strength is limited by her multiple joints pain, especially right hip pain  Pelvic and bilateral hip x-ray to rule out hip pathology  EMG nerve conduction study for evaluation of peripheral neuropathy,  Cymbalta 30 mg daily  DIAGNOSTIC DATA (LABS, IMAGING, TESTING) - I reviewed patient records, labs, notes, testing and imaging myself where available. A1c 10.2, CMP 1.19, CBC Hg 12.5, Vit D 27.   MEDICAL HISTORY:  Julia Knight, is a 71 year old female seen in request b orthopedic surgeon Dr. Louanne Skye, Daleen Bo, for evaluation of lower extremity weakness, pain, she is accompanied by his daughter and son-in-law at today's visit February 01, 2022, his primary care is nurse practitioner Early, Coralee Pesa, she only speaks Spanish, history is through [place] family member  I reviewed and summarized the referring note. PMHX. Lumbar decompression surgery on September 01 2020 HLD DM,  HTN GERD  She has a long history of chronic low back pain, more on the right side, radiating pain to her right lower extremity, also has bilateral foot numbness tingling, eventually underwent lumbar decompression surgery on September 01, 2020  Surgery did help her low back pain some, but still have significant radiating discomfort to bilateral hip, especially right hip, multiple joints pain, lower extremity cramping, not function well, denies bowel or bladder  incontinence,  Personally reviewed MRI of lumbar spine in May 2023, postsurgical change reflecting posterior decompression from L1-3 L4-5 and fusion from L3-L5, no significant spinal canal stenosis at diffuse level, but there is grade 1 anterolisthesis at L2-3 and L4-5 with moderate left neuroforaminal stenosis, multilevel degenerative changes, severe bilateral foraminal narrowing L2-3,     PHYSICAL EXAM:   Vitals:   02/01/22 1434  BP: 130/72  Pulse: 88  Weight: 184 lb (83.5 kg)  Height: 5' (1.524 m)   Not recorded     Body mass index is 35.94 kg/m.  PHYSICAL EXAMNIATION:  Gen: NAD, conversant, well nourised, well groomed                     Cardiovascular: Regular rate rhythm, no peripheral edema, warm, nontender. Eyes: Conjunctivae clear without exudates or hemorrhage Neck: Supple, no carotid bruits. Pulmonary: Clear to auscultation bilaterally   NEUROLOGICAL EXAM:  MENTAL STATUS: Speech/cognition: Awake, alert, but communication is limited by language barrier, CRANIAL NERVES: CN II: Visual fields are full to confrontation. Pupils are round equal and briskly reactive to light. CN III, IV, VI: extraocular movement are normal. No ptosis. CN V: Facial sensation is intact to light touch CN VII: Face is symmetric with normal eye closure  CN VIII: Hearing is normal to causal conversation. CN IX, X: Phonation is normal. CN XI: Head turning and shoulder shrug are intact  MOTOR: There was no significant muscle weakness, but the motor examination is limited because of multiple joints pain, bilateral elbow, hip, knee, toe joints,  REFLEXES: Reflexes are 1 and symmetric at the biceps, triceps,  knees, and absent at ankles. Plantar responses are flexor.  SENSORY: Absent vibratory sensation to knee level, length-dependent decreased light touch, pinprick,  COORDINATION: There is no trunk or limb dysmetria noted.  GAIT/STANCE: Need push-up to get up from seated position,  antalgic, cautious  REVIEW OF SYSTEMS:  Full 14 system review of systems performed and notable only for as above All other review of systems were negative.   ALLERGIES: No Known Allergies  HOME MEDICATIONS: Current Outpatient Medications  Medication Sig Dispense Refill   atorvastatin (LIPITOR) 40 MG tablet Take 1 tablet (40 mg total) by mouth at bedtime. 90 tablet 3   calcium carbonate (OSCAL) 1500 (600 Ca) MG TABS tablet Take 600 mg of elemental calcium by mouth in the morning.     docusate sodium (COLACE) 100 MG capsule Take 1 capsule (100 mg total) by mouth 2 (two) times daily. 60 capsule 0   gabapentin (NEURONTIN) 300 MG capsule Tome una tableta por oral cada 8 horas segun sea necesario para el dolor. 90 capsule 3   glipiZIDE (GLUCOTROL) 5 MG tablet Take 1 tablet (5 mg total) by mouth 2 (two) times daily before a meal. 60 tablet 3   lisinopril (ZESTRIL) 20 MG tablet Take 1 tablet (20 mg total) by mouth daily. 90 tablet 3   metFORMIN (GLUCOPHAGE) 1000 MG tablet Take 1 tablet (1,000 mg total) by mouth 2 (two) times daily (in the morning and evening with a meal). 180 tablet 3   Multiple Vitamin (MULTIVITAMIN WITH MINERALS) TABS tablet Take 1 tablet by mouth in the morning.     omega-3 acid ethyl esters (LOVAZA) 1 g capsule Take 1 g by mouth daily.     ondansetron (ZOFRAN-ODT) 8 MG disintegrating tablet Take 1 tablet (8 mg total) by mouth every 8 (eight) hours as needed for nausea. 20 tablet 3   pantoprazole (PROTONIX) 40 MG tablet Take 1 tablet (40 mg total) by mouth 2 (two) times daily. 180 tablet 3   polyethylene glycol (MIRALAX / GLYCOLAX) 17 g packet Take 17 g by mouth 2 (two) times daily. Take twice a day every day while on pain medications. Then may take as needed. 30 packet 6   sucralfate (CARAFATE) 1 g tablet Take 1 tablet (1 g total) by mouth 4 (four) times daily. 360 tablet 3   No current facility-administered medications for this visit.    PAST MEDICAL HISTORY: Past Medical  History:  Diagnosis Date   Acute blood loss anemia 09/10/2020   Allergic rhinitis 08/24/2011   Allergy    Anxiety    Arthritis    back and hands   Constipation due to opioid therapy 09/18/2020   COVID-19 11/16/2018   Depression    Diabetes mellitus without complication ([place])    type 2   Dizziness 08/16/2017   Encounter for medical examination to establish care 07/24/2020   Gastroenteritis, infectious, presumed 08/16/2017   GERD (gastroesophageal reflux disease)    Glossitis 09/10/2020   Hyperlipemia    Hypertension    Neuromuscular disorder ([place])     PAST SURGICAL HISTORY: Past Surgical History:  Procedure Laterality Date   APPENDECTOMY     CHOLECYSTECTOMY     TONSILLECTOMY     removed as an adult    FAMILY HISTORY: History reviewed. No pertinent family history.  SOCIAL HISTORY: Social History   Socioeconomic History   Marital status: Widowed    Spouse name: Not on file   Number of children: Not on file   Years of  education: Not on file   Highest education level: Not on file  Occupational History   Not on file  Tobacco Use   Smoking status: Former    Types: Cigarettes   Smokeless tobacco: Never  Vaping Use   Vaping Use: Never used  Substance and Sexual Activity   Alcohol use: Never   Drug use: Never   Sexual activity: Not Currently  Other Topics Concern   Not on file  Social History Narrative   ** Merged History Encounter **       Social Determinants of Health   Financial Resource Strain: Not on file  Food Insecurity: Not on file  Transportation Needs: Not on file  Physical Activity: Not on file  Stress: Not on file  Social Connections: Not on file  Intimate Partner Violence: Not on file      Marcial Pacas, M.D. Ph.D.  [facility] [address], [place], [place] [address] Ph: [phone] Fax: [phone]  CC:  Jessy Oto, [place],  [place] [address]  Orma Render, NP

## 2022-02-02 ENCOUNTER — Other Ambulatory Visit: Payer: Self-pay

## 2022-02-03 ENCOUNTER — Ambulatory Visit: Payer: Self-pay | Admitting: Specialist

## 2022-02-22 ENCOUNTER — Ambulatory Visit: Payer: Self-pay | Admitting: Neurology

## 2022-02-23 ENCOUNTER — Ambulatory Visit: Payer: Self-pay | Admitting: Specialist

## 2022-03-25 IMAGING — MG MM DIGITAL SCREENING BILAT W/ TOMO AND CAD
8 series · 8 of 24 positions shown · non-contrast
Comparison: None.

CLINICAL DATA: Screening.

EXAM:
DIGITAL SCREENING BILATERAL MAMMOGRAM WITH TOMOSYNTHESIS AND CAD
TECHNIQUE: Bilateral screening digital craniocaudal and mediolateral oblique
mammograms were obtained. Bilateral screening digital breast
tomosynthesis was performed. The images were evaluated with
computer-aided detection.

[L CC synth-2D]
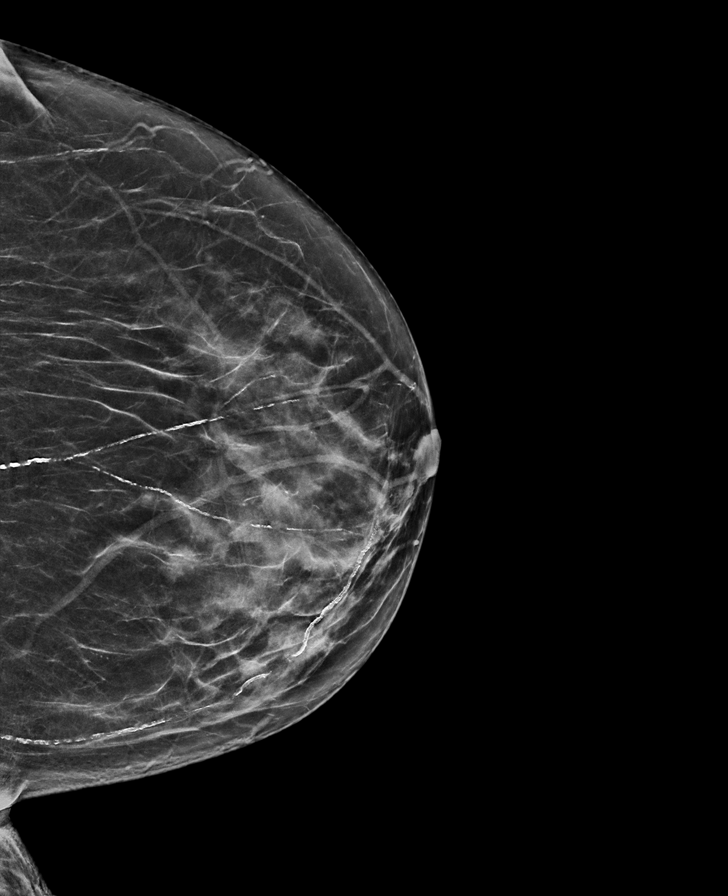

[R CC synth-2D]
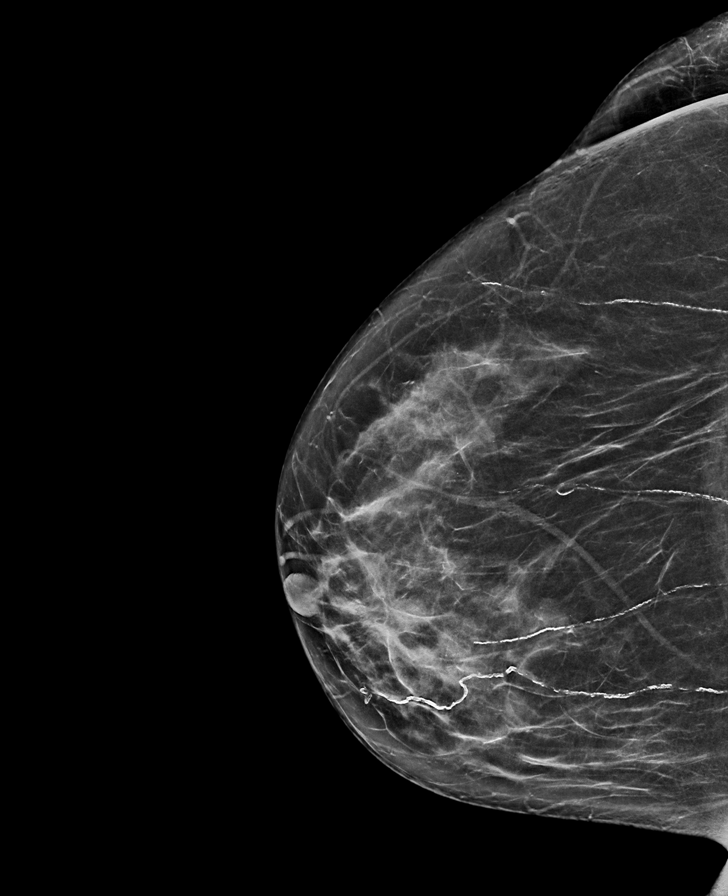

[L MLO synth-2D]
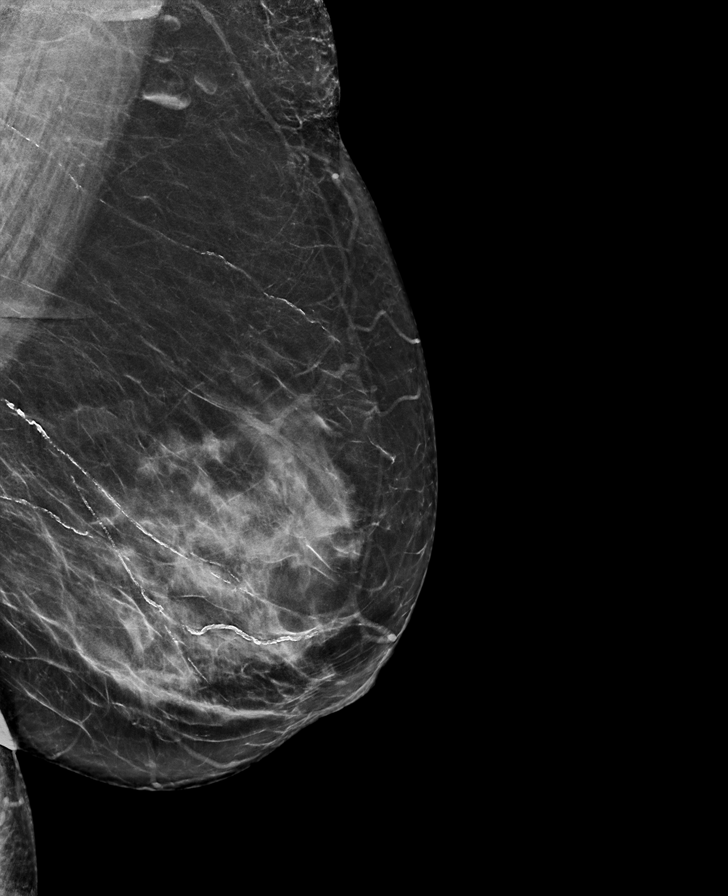

[R MLO synth-2D]
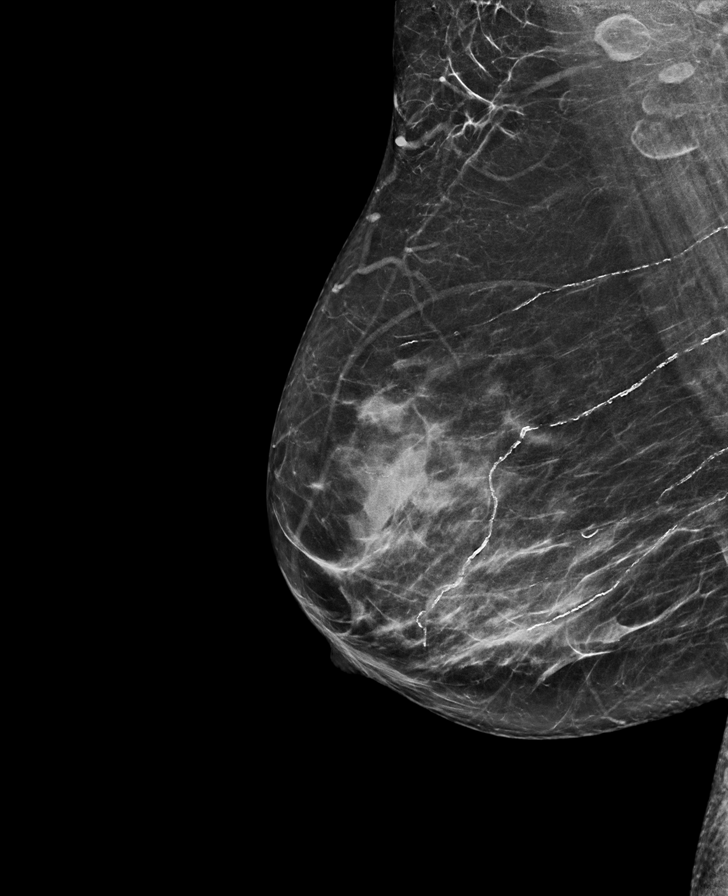

[L MLO tomo · tomo slice 39/77.0]
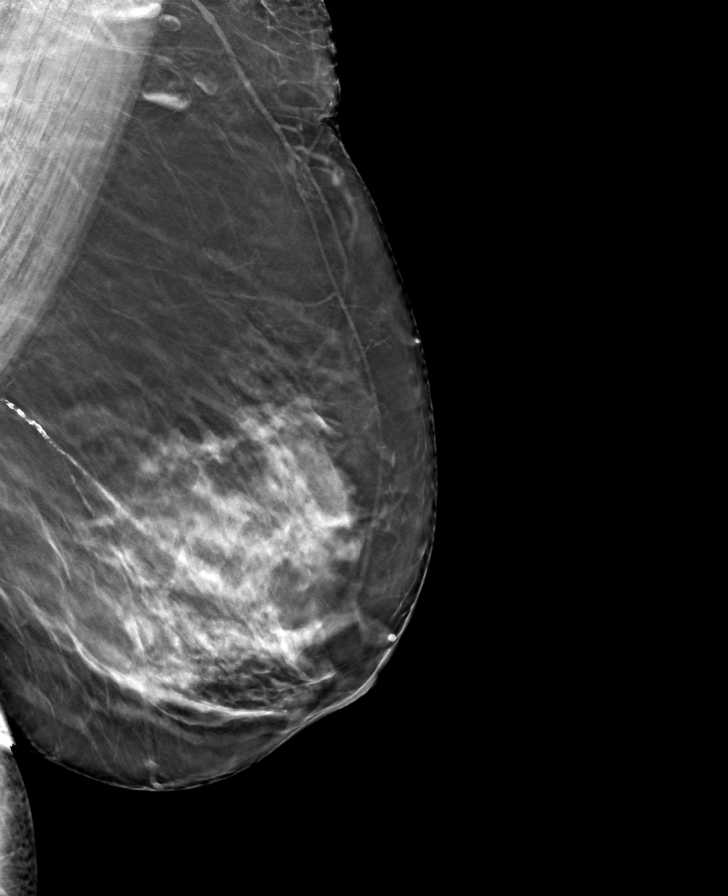

[R CC tomo · tomo slice 33/64.0]
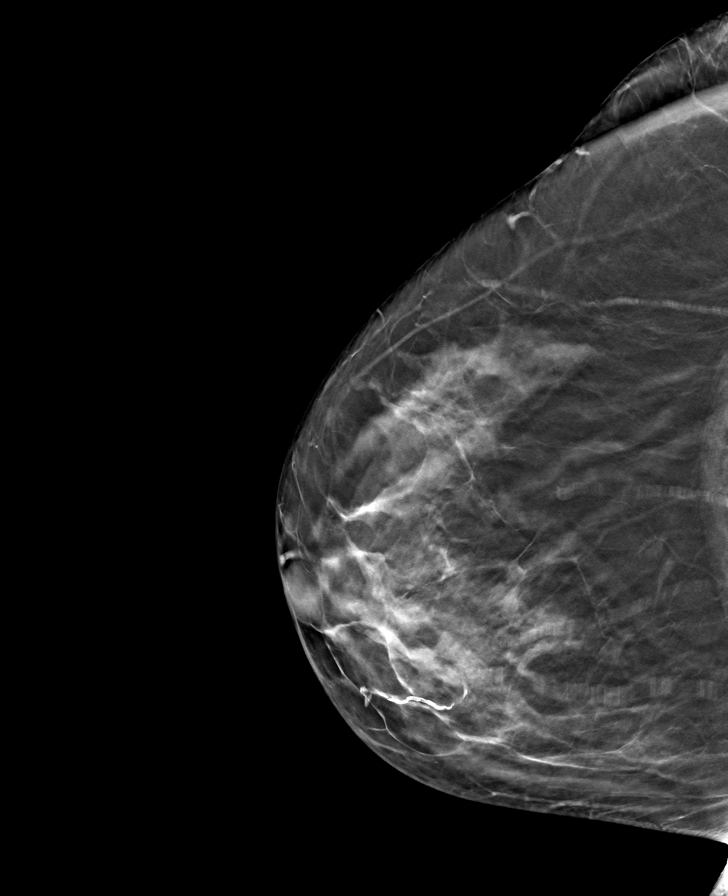

[R MLO tomo · tomo slice 35/69.0]
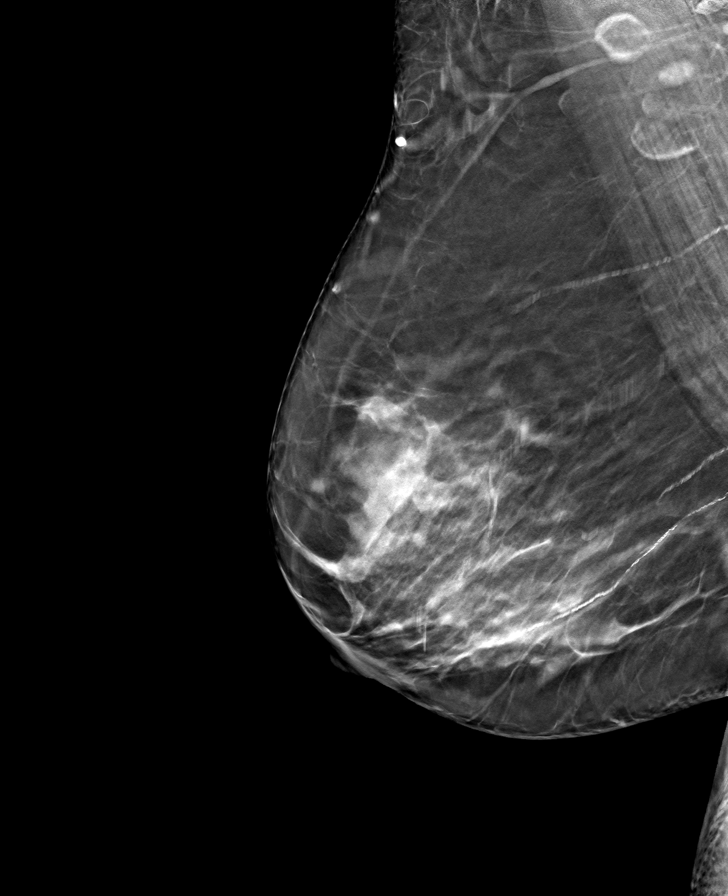

[L CC tomo · tomo slice 33/64.0]
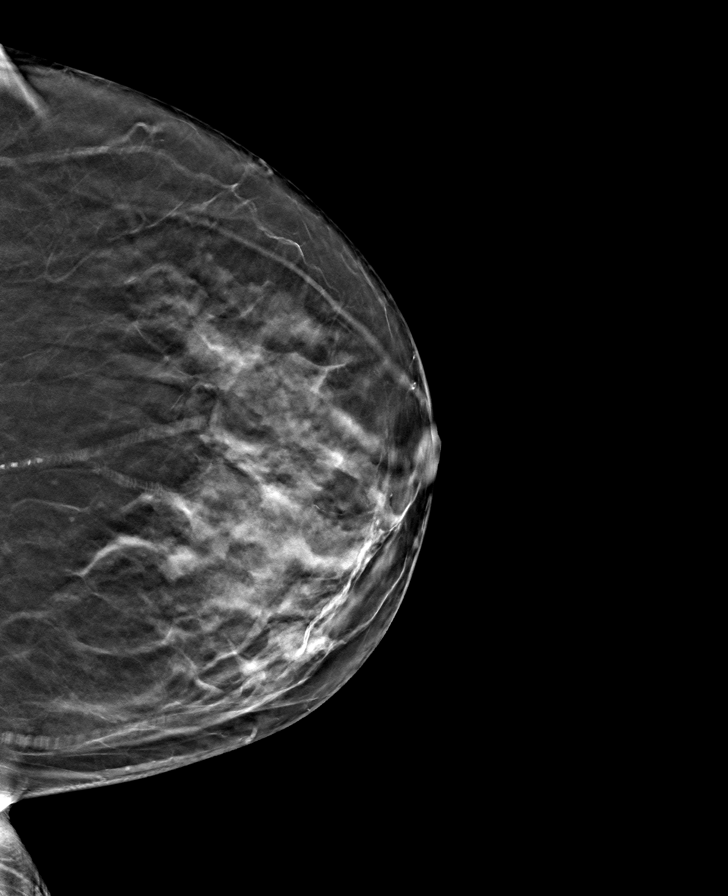

[8 of 24 positions shown; findings below may reference images not displayed]

ACR Breast Density Category c: The breast tissue is heterogeneously
dense, which may obscure small masses
FINDINGS: There are no findings suspicious for malignancy.
IMPRESSION: No mammographic evidence of malignancy. A result letter of this
screening mammogram will be mailed directly to the patient.

RECOMMENDATION:
Screening mammogram in one year. (Code:C8-T-HNK)

BI-RADS CATEGORY  1: Negative.

## 2022-05-04 ENCOUNTER — Encounter: Payer: Self-pay | Admitting: Neurology

## 2022-05-09 DIAGNOSIS — C801 Malignant (primary) neoplasm, unspecified: Secondary | ICD-10-CM

## 2022-05-09 HISTORY — DX: Malignant (primary) neoplasm, unspecified: C80.1

## 2022-05-19 ENCOUNTER — Ambulatory Visit (INDEPENDENT_AMBULATORY_CARE_PROVIDER_SITE_OTHER): Payer: Self-pay | Admitting: Orthopedic Surgery

## 2022-05-19 ENCOUNTER — Ambulatory Visit: Payer: Self-pay

## 2022-05-19 ENCOUNTER — Encounter: Payer: Self-pay | Admitting: Orthopedic Surgery

## 2022-05-19 VITALS — BP 162/80 | HR 111 | Ht 60.0 in | Wt 184.0 lb

## 2022-05-19 DIAGNOSIS — Z9889 Other specified postprocedural states: Secondary | ICD-10-CM

## 2022-05-19 DIAGNOSIS — M48062 Spinal stenosis, lumbar region with neurogenic claudication: Secondary | ICD-10-CM

## 2022-05-19 DIAGNOSIS — M4712 Other spondylosis with myelopathy, cervical region: Secondary | ICD-10-CM

## 2022-05-19 MED ORDER — PREGABALIN 75 MG PO CAPS: 75.0000 mg | ORAL_CAPSULE | Freq: Two times a day (BID) | ORAL | 0 refills | Status: DC

## 2022-05-19 NOTE — Progress Notes (Signed)
Orthopedic Spine Surgery Office Note  Assessment: Patient is a 72 y.o. female with low back pain that radiates into bilateral lower extremities along the lateral aspect.  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  I think that that could explain her pain.  However she also has symptoms of myelopathy that are not explained by that foraminal stenosis.   Plan: -Discussed the fact that she has foraminal stenosis and symptoms that seem consistent with that especially given the fact that she had improvement with steroid injections.  However, I discussed the natural history of myelopathy and why I feel that should be evaluated first before proceeding with any operative treatment of the lumbar spine.  Accordingly, I ordered an MRI of the cervical spine today.  I prescribed her Lyrica to help with some of the radicular leg pain since gabapentin has not helped previously. -Her A1c was 10.2 on 01/25/2022.  If she has myelopathy, I will explained to her that the risks are higher of doing surgery given her elevated A1c but we may proceed given the natural history of myelopathy.  However, I will inform her that no lumbar surgery would be considered until her A1c was 7.5 or less. The same applies to her use of nicotine-containing products -Patient should return to office in 4 weeks, x-rays at next visit: 4 view cervical   Patient expressed understanding of the plan and all questions were answered to the patient's satisfaction.   ___________________________________________________________________________   History:  Patient is a 72 y.o. female who presents today for lumbar lumbar spine.  Patient has a history of lumbar laminectomy from L2-S1, L4/5 and L5/S1 TLIF's with Dr. Louanne Skye on 09/01/2020.  She states that she had that surgery for pain that was radiating into her legs and due to pain with walking.  She said that she got relief with that surgery and has been able to walk more.  However, she has developed new  and significant pain radiating into her bilateral legs along the lateral aspect.  It goes into the lateral thighs and into the anterior lateral aspects of the legs.  It is worse with activity and improves with rest.  She has had injections to L2 and L3 with Dr. Ernestina Patches.  She says she got decent relief for 2 to 3 months with those injections but then the pain returned.  She is more symptomatic on the right side.  There is no trauma or injury that brought on the pain.  On further questioning, she also mentions and balance.  So, we talked about some of her other symptoms.  She states that she feels like she has unsteadiness when she is walking.  She is using a cane to help with her balance.  She has to use a handrail when going up the stairs.  She also has had issues with her hands.  She has decreased sensation in her bilateral hands and says that she has very little sensation in them.  She has noticed difficulty using her hands and has trouble dressing herself because of a lack of dexterity in the decree sensation in the hands.  She also has cramping type pains radiating down her arms.  She feels it mostly in the shoulders but it does go down further into the arm and forearms as well.  Pain does not radiate into the hands.  The hands just have significantly decreased sensation.  She states that the symptoms have been present for over a year now.  They have been  getting progressively worse with time.   Weakness: Yes, legs feel weaker bilaterally.  No other weakness noted Difficulty with fine motor skills (e.g., buttoning shirts, handwriting): Yes, has great difficulty with buttoning shirts and trouble with handwriting Symptoms of imbalance: Yes, using a cane because of her imbalance issues Paresthesias and numbness: Yes, decreased sensation in bilateral hands.  Sometimes gets paresthesias along the lateral aspect of her legs. Bowel or bladder incontinence: Denies Saddle anesthesia: Denies  Treatments tried:  L2-3 steroid injection, ibuprofen, activity modification, cane use  Review of systems: Denies fevers and chills, night sweats, unexplained weight loss, history of cancer, pain that wakes them at night  Past medical history: Hyperlipidemia Hypertension GERD Anxiety Diabetes (last A1C was 10.2 on 01/25/2022) RA Chronic pain  Allergies: NKDA  Past surgical history:  L2-S1 laminectomies and L4-S1 TLIF's Appendectomy Tonsillectomy  Social history: Reports use of nicotine product (smoking, vaping, patches, smokeless) Alcohol use: denies Denies recreational drug use  Physical Exam:  General: no acute distress, appears stated age Neurologic: alert, answering questions appropriately, following commands Respiratory: unlabored breathing on room air, symmetric chest rise Psychiatric: appropriate affect, normal cadence to speech   MSK (spine):  -Strength exam      Left  Right Grip strength               4/5  4/5 Interosseus   5/5   5/5 Wrist extension  5/5  5/5 Wrist flexion   5/5  5/5 Elbow flexion   5/5  5/5 Deltoid    5/5  5/5  EHL    4/5  4/5 TA    5/5  5/5 GSC    5/5  5/5 Knee extension  5/5  5/5 Hip flexion   4+/5  4+/5  -Sensory exam    Sensation intact to light touch in L3-S1 nerve distributions of bilateral lower extremities  Sensation intact to light touch in C5-T1 nerve distributions of bilateral upper extremities  -Brachioradialis DTR: 2/4 on the left, 2/4 on the right -Biceps DTR: 2/4 on the left, 2/4 on the right -Achilles DTR: 1/4 on the left, 1/4 on the right -Patellar tendon DTR: 1/4 on the left, 1/4 on the right  -Spurling: Negative bilaterally -Hoffman sign: Positive on the left, negative on the right -Clonus: No beats bilaterally -Interosseous wasting: None seen -Grip and release test: Positive -Romberg: Positive -Gait: Wide-based and needs use of cane -Imbalance with tandem gait: Yes  Left shoulder exam: Pain through range of motion  especially at end of external rotation, positive Jobe with pain, negative drop arm sign, negative belly press, no weakness with external rotation with arm at side Right shoulder exam: Pain through range of motion especially at end of external rotation, positive Jobe with pain, negative drop arm sign, negative belly press, no weakness with external rotation with arm at side  Left hip exam: No pain through range of motion, negative Stinchfield, negative Faber Right hip exam: No pain through range of motion, negative Stinchfield, negative Faber  Tinel's at wrist: Negative bilaterally Phalen's at wrist: Negative bilaterally Durkan's: Negative bilaterally  Tinel's at elbow: Negative bilaterally  Imaging: X-ray of the lumbar spine from 05/19/2022 was independently reviewed and interpreted, showing transitional anatomy but for labeling purposes the lowest instrumented level will be S1.  There is a L4-S1 instrumented fusion with interbody's at L4/5 and L5/S1.  There is no bridging bone seen at the cranial aspect of the L5/S1 interbody device.  No lucency seen around the screws and no  backing out of the screw seen.  Disc height loss at L2/3 and L3/4 -vacuum disc phenomenon at those levels as well.  Laminectomy defects from L2-S1.  No fracture or dislocation seen.  MRI of the lumbar spine from 09/22/2021 shows transitional anatomy.  For labeling purposes, the lowest instrumented level is S1.  DDD at L2/3 and L3/4.  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  The foraminal stenosis is more significant at L3/4.  Lateral recess stenosis at L2-3.  Laminectomy defects from L2-S1.  XR of the cervical spine from 05/19/2022 was independently reviewed and interpreted, showing no coronal deformity.  There appears to be facet arthropathy.  Only 1 view was sent although appears 4 views were taken but I cannot see those other views.  Patient name: Julia Knight Patient MRN: [id number] Date of visit:  05/19/22   Today's entire exam was performed with the aid of a in person Spanish translator

## 2022-06-05 ENCOUNTER — Ambulatory Visit
Admission: RE | Admit: 2022-06-05 | Discharge: 2022-06-05 | Disposition: A | Discharge status: Home / Self Care | Payer: Self-pay | Source: Ambulatory Visit | Attending: Orthopedic Surgery | Admitting: Orthopedic Surgery

## 2022-06-05 DIAGNOSIS — M4712 Other spondylosis with myelopathy, cervical region: Secondary | ICD-10-CM

## 2022-06-27 ENCOUNTER — Other Ambulatory Visit: Payer: Self-pay

## 2022-06-27 ENCOUNTER — Ambulatory Visit (INDEPENDENT_AMBULATORY_CARE_PROVIDER_SITE_OTHER): Payer: Self-pay | Admitting: Orthopedic Surgery

## 2022-06-27 DIAGNOSIS — M4712 Other spondylosis with myelopathy, cervical region: Secondary | ICD-10-CM

## 2022-06-27 MED ORDER — PREGABALIN 75 MG PO CAPS
75.0000 mg | ORAL_CAPSULE | Freq: Two times a day (BID) | ORAL | 1 refills | Status: DC
  Filled 2022-06-27: qty 60, 30d supply, fill #0

## 2022-06-27 NOTE — Progress Notes (Signed)
Orthopedic Spine Surgery Office Note  Assessment: Patient is a 72 y.o. female with symptoms concerning for myelopathy. Has stenosis on her MRI at multiple levels.    Plan: -Explained the natural history of cervical myelopathy and that operative management is usually recommended for the problem due to the natural history -Due to the complexity of her potential surgery and the lack of experience assistance with upper cervical spine surgery, recommended she go to an academic institution to consider surgery for her symptoms of myelopathy   Patient expressed understanding of the plan and all questions were answered to the patient's satisfaction.   __________________________________________________________________________  History: Patient is a 72 y.o. female who has been previously seen in the office for symptoms of lumbar radiculopathy, but also symptoms of myelopathy. Her symptoms are unchanged since the last time I saw her. She is still having significant imbalance and is dropping objects. She also has trouble with fine motor skills in her hands. Her low back pain radiating into her legs is also unchanged. Has decreased sensation in her hands bilaterally.   Previous treatments: L2-3 steroid injection, ibuprofen, activity modification, cane use   Physical Exam:   General: no acute distress, appears stated age Neurologic: alert, answering questions appropriately, following commands Respiratory: unlabored breathing on room air, symmetric chest rise Psychiatric: appropriate affect, normal cadence to speech     MSK (spine):   -Strength exam                                                   Left                  Right Grip strength               4/5                   4/5 Interosseus                  5/5                  5/5 Wrist extension            5/5                  5/5 Wrist flexion                 5/5                  5/5 Elbow flexion                5/5                   5/5 Deltoid                          5/5                  5/5   EHL                              4/5                  4/5 TA  5/5                  5/5 GSC                             5/5                  5/5 Knee extension            5/5                  5/5 Hip flexion                    4+/5                4+/5   -Sensory exam                           Sensation intact to light touch in L3-S1 nerve distributions of bilateral lower extremities             Sensation intact to light touch in C5-T1 nerve distributions of bilateral upper extremities   -Brachioradialis DTR: 2/4 on the left, 2/4 on the right -Biceps DTR: 2/4 on the left, 2/4 on the right -Achilles DTR: 1/4 on the left, 1/4 on the right -Patellar tendon DTR: 1/4 on the left, 1/4 on the right   -Spurling: Negative bilaterally -Hoffman sign: Positive on the left, negative on the right -Clonus: No beats bilaterally -Interosseous wasting: None seen -Grip and release test: Positive -Romberg: Positive -Gait: Wide-based and needs use of cane -Imbalance with tandem gait: Yes   Left shoulder exam: Pain through range of motion especially at end of external rotation, positive Jobe with pain, negative drop arm sign, negative belly press, no weakness with external rotation with arm at side Right shoulder exam: Pain through range of motion especially at end of external rotation, positive Jobe with pain, negative drop arm sign, negative belly press, no weakness with external rotation with arm at side   Tinel's at wrist: Negative bilaterally Phalen's at wrist: Negative bilaterally Durkan's: Negative bilaterally   Tinel's at elbow: Negative bilaterally  Imaging: XR of the cervical spine from 05/19/2022 was independently reviewed and interpreted, showing disc height loss from C4-7 with anterior osteophyte formation. No fracture or dislocation. No evidence of instability on flexion/extension views.   MRI  of the cervical spine from 06/05/2022 was independently reviewed and interpreted, showing congenital stenosis. Has 42m of posterior space available for the cord behind the dens. There is <171mof space available for the cord at multiple levels. No T2 cord signal change seen.    Patient name: Julia Hodginsatient MRN: [id number] of visit: 06/27/22   This entire visit was done with the help of an in-person spanish interpreter

## 2022-06-28 ENCOUNTER — Other Ambulatory Visit: Payer: Self-pay

## 2022-07-11 ENCOUNTER — Other Ambulatory Visit: Payer: Self-pay

## 2022-07-12 ENCOUNTER — Telehealth (HOSPITAL_BASED_OUTPATIENT_CLINIC_OR_DEPARTMENT_OTHER): Payer: Self-pay | Admitting: Family Medicine

## 2022-07-12 NOTE — Telephone Encounter (Signed)
Blood Pressure Meds, and diabetic meds , please call into the pharmacy downstairs

## 2022-07-13 ENCOUNTER — Other Ambulatory Visit: Payer: Self-pay

## 2022-07-19 ENCOUNTER — Other Ambulatory Visit: Payer: Self-pay

## 2022-07-21 ENCOUNTER — Other Ambulatory Visit: Payer: Self-pay

## 2022-08-04 ENCOUNTER — Ambulatory Visit (INDEPENDENT_AMBULATORY_CARE_PROVIDER_SITE_OTHER): Payer: Self-pay | Admitting: Family Medicine

## 2022-08-04 ENCOUNTER — Other Ambulatory Visit (HOSPITAL_BASED_OUTPATIENT_CLINIC_OR_DEPARTMENT_OTHER): Payer: Self-pay

## 2022-08-04 ENCOUNTER — Encounter (HOSPITAL_BASED_OUTPATIENT_CLINIC_OR_DEPARTMENT_OTHER): Payer: Self-pay | Admitting: Family Medicine

## 2022-08-04 VITALS — BP 130/69 | HR 84 | Ht 60.0 in | Wt 183.0 lb

## 2022-08-04 DIAGNOSIS — K219 Gastro-esophageal reflux disease without esophagitis: Secondary | ICD-10-CM

## 2022-08-04 DIAGNOSIS — E1165 Type 2 diabetes mellitus with hyperglycemia: Secondary | ICD-10-CM

## 2022-08-04 LAB — HEMOGLOBIN A1C
Est. average glucose Bld gHb Est-mCnc: 237 mg/dL
Hgb A1c MFr Bld: 9.9 % — ABNORMAL HIGH (ref 4.8–5.6)

## 2022-08-04 MED ORDER — GLIPIZIDE 5 MG PO TABS
5.0000 mg | ORAL_TABLET | Freq: Two times a day (BID) | ORAL | 1 refills | Status: DC
  Filled 2022-08-04: qty 180, 90d supply, fill #0

## 2022-08-04 NOTE — Progress Notes (Signed)
    Procedures performed today:    None.  Independent interpretation of notes and tests performed by another provider:   None.  Brief History, Exam, Impression, and Recommendations:    BP 130/69 (BP Location: Right Arm, Patient Position: Sitting, Cuff Size: Normal)   Pulse 84   Ht 5' (1.524 m)   Wt 183 lb (83 kg)   SpO2 100%   BMI 35.74 kg/m   In person interpreter utilized during duration of encounter  GERD (gastroesophageal reflux disease) Patient presents today with reported abdominal pain and nausea.  She has longstanding history of GERD and currently utilizes pantoprazole twice daily as well as Carafate to help with symptoms.  She reports that symptoms are similar in quality, however she feels that severity has been gradually more progressive in recent months. Previously, patient has been referred to gastroenterology, however still has not been able to schedule establishing visit.  It does appear that gastroenterology office has reached out to patient unsuccessfully. On exam today, patient is in no acute distress with stable vital signs.  Abdomen is soft, moderate tenderness diffusely, more so through upper abdomen. Unfortunately, patient is currently on twice daily PPI as well as Carafate.  Ultimately, it is important that she have further evaluation with GI.  She was provided with contact information for GI office today so that she may schedule establishing visit.  Provided with phone number and appropriate extension to be able to schedule office visit patient voiced understanding and agreement with importance of scheduling further evaluation with GI specialist  T2DM (type 2 diabetes mellitus) ([place]) Unfortunate, patient has had poorly controlled diabetes for about 4 to 5 years now in reviewing chart and prior hemoglobin A1c readings.  Most recent hemoglobin A1c was notably elevated at 10.2%.  At that time, former PCP did recommend starting additional medication, glipizide.   Patient initially reported that she was not taking his medication, however later indicated that she recalled that she was taking the medication, however did run out of this medication and thus is not taking currently.  Unfortunately, patient was recommended to follow-up in the office about 3 months ago but ultimately did not Given poor control of blood sugars, patient is at high risk of numerous complications as well as premature death which has been communicated previously.  Again discussed importance of better controlling blood sugars and need for regular follow-up.  We will check hemoglobin A1c today for monitoring.  Given chronic poor control of blood sugars, recommend establishing with endocrinologist locally for further assistance, referral placed today Will plan for follow-up in about 2 months to monitor progress  Return in about 2 months (around 10/04/2022) for DM.  Spent 33 minutes on this patient encounter, including preparation, chart review, face-to-face counseling with patient and coordination of care, and documentation of encounter   ___________________________________________ Geneive Sandstrom de [place], MD, ABFM, [facility] [place]

## 2022-08-04 NOTE — Assessment & Plan Note (Signed)
Unfortunate, patient has had poorly controlled diabetes for about 4 to 5 years now in reviewing chart and prior hemoglobin A1c readings.  Most recent hemoglobin A1c was notably elevated at 10.2%.  At that time, former PCP did recommend starting additional medication, glipizide.  Patient initially reported that she was not taking his medication, however later indicated that she recalled that she was taking the medication, however did run out of this medication and thus is not taking currently.  Unfortunately, patient was recommended to follow-up in the office about 3 months ago but ultimately did not Given poor control of blood sugars, patient is at high risk of numerous complications as well as premature death which has been communicated previously.  Again discussed importance of better controlling blood sugars and need for regular follow-up.  We will check hemoglobin A1c today for monitoring.  Given chronic poor control of blood sugars, recommend establishing with endocrinologist locally for further assistance, referral placed today Will plan for follow-up in about 2 months to monitor progress

## 2022-08-04 NOTE — Assessment & Plan Note (Signed)
Patient presents today with reported abdominal pain and nausea.  She has longstanding history of GERD and currently utilizes pantoprazole twice daily as well as Carafate to help with symptoms.  She reports that symptoms are similar in quality, however she feels that severity has been gradually more progressive in recent months. Previously, patient has been referred to gastroenterology, however still has not been able to schedule establishing visit.  It does appear that gastroenterology office has reached out to patient unsuccessfully. On exam today, patient is in no acute distress with stable vital signs.  Abdomen is soft, moderate tenderness diffusely, more so through upper abdomen. Unfortunately, patient is currently on twice daily PPI as well as Carafate.  Ultimately, it is important that she have further evaluation with GI.  She was provided with contact information for GI office today so that she may schedule establishing visit.  Provided with phone number and appropriate extension to be able to schedule office visit patient voiced understanding and agreement with importance of scheduling further evaluation with GI specialist

## 2022-08-05 ENCOUNTER — Encounter (HOSPITAL_COMMUNITY): Payer: Self-pay

## 2022-08-05 ENCOUNTER — Other Ambulatory Visit: Payer: Self-pay

## 2022-08-05 ENCOUNTER — Emergency Department (HOSPITAL_COMMUNITY)
Admission: EM | Admit: 2022-08-05 | Discharge: 2022-08-06 | Disposition: A | Discharge status: Home / Self Care | Payer: Self-pay | Attending: Emergency Medicine | Admitting: Emergency Medicine

## 2022-08-05 DIAGNOSIS — K920 Hematemesis: Secondary | ICD-10-CM

## 2022-08-05 DIAGNOSIS — N189 Chronic kidney disease, unspecified: Secondary | ICD-10-CM | POA: Insufficient documentation

## 2022-08-05 DIAGNOSIS — D649 Anemia, unspecified: Secondary | ICD-10-CM

## 2022-08-05 DIAGNOSIS — R748 Abnormal levels of other serum enzymes: Secondary | ICD-10-CM

## 2022-08-05 DIAGNOSIS — N289 Disorder of kidney and ureter, unspecified: Secondary | ICD-10-CM

## 2022-08-05 DIAGNOSIS — E871 Hypo-osmolality and hyponatremia: Secondary | ICD-10-CM

## 2022-08-05 DIAGNOSIS — R112 Nausea with vomiting, unspecified: Secondary | ICD-10-CM

## 2022-08-05 DIAGNOSIS — R1013 Epigastric pain: Secondary | ICD-10-CM

## 2022-08-05 LAB — COMPREHENSIVE METABOLIC PANEL
ALT: 17 U/L (ref 0–44)
AST: 17 U/L (ref 15–41)
Albumin: 3.9 g/dL (ref 3.5–5.0)
Alkaline Phosphatase: 170 U/L — ABNORMAL HIGH (ref 38–126)
Anion gap: 9 (ref 5–15)
BUN: 21 mg/dL (ref 8–23)
CO2: 21 mmol/L — ABNORMAL LOW (ref 22–32)
Calcium: 9.1 mg/dL (ref 8.9–10.3)
Chloride: 98 mmol/L (ref 98–111)
Creatinine, Ser: 1.23 mg/dL — ABNORMAL HIGH (ref 0.44–1.00)
GFR, Estimated: 48 mL/min — ABNORMAL LOW (ref >60)
Glucose, Bld: 177 mg/dL — ABNORMAL HIGH (ref 70–99)
Potassium: 5 mmol/L (ref 3.5–5.1)
Sodium: 128 mmol/L — ABNORMAL LOW (ref 135–145)
Total Bilirubin: 0.6 mg/dL (ref 0.3–1.2)
Total Protein: 7.3 g/dL (ref 6.5–8.1)

## 2022-08-05 LAB — LIPASE, BLOOD: Lipase: 38 U/L (ref 11–51)

## 2022-08-05 LAB — CBC WITH DIFFERENTIAL/PLATELET
Abs Immature Granulocytes: 0.04 10*3/uL (ref 0.00–0.07)
Basophils Absolute: 0.1 10*3/uL (ref 0.0–0.1)
Basophils Relative: 1 %
Eosinophils Absolute: 0.4 10*3/uL (ref 0.0–0.5)
Eosinophils Relative: 3 %
HCT: 34.3 % — ABNORMAL LOW (ref 36.0–46.0)
Hemoglobin: 11.1 g/dL — ABNORMAL LOW (ref 12.0–15.0)
Immature Granulocytes: 0 %
Lymphocytes Relative: 32 %
Lymphs Abs: 3.9 10*3/uL (ref 0.7–4.0)
MCH: 27.9 pg (ref 26.0–34.0)
MCHC: 32.4 g/dL (ref 30.0–36.0)
MCV: 86.2 fL (ref 80.0–100.0)
Monocytes Absolute: 1 10*3/uL (ref 0.1–1.0)
Monocytes Relative: 8 %
Neutro Abs: 6.5 10*3/uL (ref 1.7–7.7)
Neutrophils Relative %: 56 %
Platelets: 298 10*3/uL (ref 150–400)
RBC: 3.98 MIL/uL (ref 3.87–5.11)
RDW: 12.9 % (ref 11.5–15.5)
WBC: 11.9 10*3/uL — ABNORMAL HIGH (ref 4.0–10.5)
nRBC: 0 % (ref 0.0–0.2)

## 2022-08-05 LAB — URINALYSIS, ROUTINE W REFLEX MICROSCOPIC
Bilirubin Urine: NEGATIVE
Glucose, UA: NEGATIVE mg/dL
Hgb urine dipstick: NEGATIVE
Ketones, ur: NEGATIVE mg/dL
Leukocytes,Ua: NEGATIVE
Nitrite: NEGATIVE
Protein, ur: NEGATIVE mg/dL
Specific Gravity, Urine: 1.006 (ref 1.005–1.030)
pH: 5 (ref 5.0–8.0)

## 2022-08-05 NOTE — ED Triage Notes (Signed)
Pt came in via POV d/t abd pain that has been going on fpr the past 2 years. States it feels like acid in her stomach & its hard for her to eat or drink. She has been seen by a provider for this pain & the meds she has been given no longer work. Explains like it feels like it burns up her throat & does have some vomiting. A/Ox4.

## 2022-08-05 NOTE — ED Provider Triage Note (Signed)
Emergency Medicine Provider Triage Evaluation Note  Julia Knight , a 72 y.o. female  was evaluated in triage.  Pt complains of epigastric abdominal pain x 3 years.  She states that she has pain every time she eats or drinks that she sometimes has pain that and waterbrash that splashes up into her throat.  She says she has been given pills by doctors in the past but they do not seem to be working anymore.  She has never seen a GI specialist in the past.  She sometimes has vomiting and sometimes has diarrhea but has no complaints of such today..  Review of Systems  Positive: Abd pain Negative: fever  Physical Exam  There were no vitals taken for this visit. Gen:   Awake, no distress   Resp:  Normal effort  MSK:   Moves extremities without difficulty  Other:    Medical Decision Making  Medically screening exam initiated at 7:00 PM.  Appropriate orders placed.  Claudina Lick was informed that the remainder of the evaluation will be completed by another provider, this initial triage assessment does not replace that evaluation, and the importance of remaining in the ED until their evaluation is complete.     Margarita Mail, PA-C 08/05/22 [id number]

## 2022-08-06 ENCOUNTER — Emergency Department (HOSPITAL_COMMUNITY): Payer: Self-pay

## 2022-08-06 MED ORDER — SODIUM CHLORIDE 0.9 % IV BOLUS
1000.0000 mL | Freq: Once | INTRAVENOUS | Status: AC
  Administered 2022-08-06: 1000 mL via INTRAVENOUS

## 2022-08-06 MED ORDER — FAMOTIDINE IN NACL 20-0.9 MG/50ML-% IV SOLN
20.0000 mg | Freq: Once | INTRAVENOUS | Status: AC
  Administered 2022-08-06: 20 mg via INTRAVENOUS
  Filled 2022-08-06: qty 50

## 2022-08-06 MED ORDER — ONDANSETRON HCL 4 MG/2ML IJ SOLN
4.0000 mg | Freq: Once | INTRAMUSCULAR | Status: AC
  Administered 2022-08-06: 4 mg via INTRAVENOUS
  Filled 2022-08-06: qty 2

## 2022-08-06 MED ORDER — IOHEXOL 9 MG/ML PO SOLN
500.0000 mL | ORAL | Status: AC
  Administered 2022-08-06 (×2): 500 mL via ORAL

## 2022-08-06 MED ORDER — ONDANSETRON 8 MG PO TBDP: 8.0000 mg | ORAL_TABLET | Freq: Three times a day (TID) | ORAL | 3 refills | Status: DC | PRN

## 2022-08-06 MED ORDER — FAMOTIDINE 20 MG PO TABS: 20.0000 mg | ORAL_TABLET | Freq: Every day | ORAL | 0 refills | Status: DC

## 2022-08-06 MED ORDER — IOHEXOL 350 MG/ML SOLN
75.0000 mL | Freq: Once | INTRAVENOUS | Status: AC | PRN
  Administered 2022-08-06: 75 mL via INTRAVENOUS

## 2022-08-06 NOTE — ED Provider Notes (Signed)
Care of patient assumed from Dr. Roxanne Mins.  This patient presents with chronic epigastric pain, refractory to home Protonix and Carafate.  Lab work is reassuring.  She does feel better after IV fluids, Zofran, and Pepcid.  Prescriptions for Zofran and Pepcid have been sent to her pharmacy.  She is currently awaiting CT scan.  If results are negative, she can be discharged with GI follow-up. Physical Exam  BP 129/63   Pulse 62   Temp (!) 97.4 F (36.3 C)   Resp 14   SpO2 100%   Physical Exam Vitals and nursing note reviewed.  Constitutional:      General: She is not in acute distress.    Appearance: She is well-developed. She is not ill-appearing, toxic-appearing or diaphoretic.  HENT:     Head: Normocephalic and atraumatic.     Mouth/Throat:     Mouth: Mucous membranes are moist.  Eyes:     General: No scleral icterus.    Extraocular Movements: Extraocular movements intact.     Conjunctiva/sclera: Conjunctivae normal.  Cardiovascular:     Rate and Rhythm: Normal rate and regular rhythm.  Pulmonary:     Effort: Pulmonary effort is normal. No respiratory distress.  Abdominal:     Palpations: Abdomen is soft.  Musculoskeletal:        General: No swelling.     Cervical back: Neck supple.  Skin:    General: Skin is warm and dry.  Neurological:     General: No focal deficit present.     Mental Status: She is alert and oriented to person, place, and time.  Psychiatric:        Mood and Affect: Mood normal.        Behavior: Behavior normal.     Procedures  Procedures  ED Course / MDM    Medical Decision Making Amount and/or Complexity of Data Reviewed Radiology: ordered.  Risk Prescription drug management.   On assessment, patient resting comfortably.  She is accompanied at bedside by family member.  She does report that her symptoms have improved since being in the ED, and that she has been able to eat and drink.  I discussed plan to continue daily PPI, Carafate, and to  add on Pepcid and Zofran as needed.  Patient is followed by GI and family member reports that they will be able to call for close follow-up appointment.  CT scan showed no acute findings.  Patient was discharged in stable condition.       Godfrey Pick, MD 08/06/22 [phone]

## 2022-08-06 NOTE — Discharge Instructions (Signed)
Julia Knight y [name]. Empiece a tomar famotidina todos los das antes de [place].

## 2022-08-06 NOTE — ED Notes (Signed)
Patient ambulated to restroom with no apparent distress. 

## 2022-08-06 NOTE — ED Provider Notes (Signed)
[facility] Provider Note   CSN: [id number] Arrival date & time: 08/05/22  1846     History  Chief Complaint  Patient presents with   Abdominal Pain    Julia Knight is a 72 y.o. female.  The history is provided by the patient.  Abdominal Pain She has history of hypertension, diabetes, hyperlipidemia, GERD and has been having upper abdominal pain for the last 3 years but it is consistently getting worse.  Over about the last month, she has been having problems with vomiting and diarrhea.  She is not eating and has had about a 15 pound weight loss over the last year.  There has been no blood in stool or emesis.  She denies fever or chills.  She has been taking pantoprazole twice a day and sucralfate, but it only gives slight and temporary relief.  She has been trying to get into see a gastroenterologist, but has not been successful thus far.   Home Medications Prior to Admission medications   Medication Sig Start Date End Date Taking? Authorizing Provider  atorvastatin (LIPITOR) 40 MG tablet Take 1 tablet (40 mg total) by mouth at bedtime. 01/25/22   Orma Render, NP  calcium carbonate (OSCAL) 1500 (600 Ca) MG TABS tablet Take 600 mg of elemental calcium by mouth in the morning.    [provider]  docusate sodium (COLACE) 100 MG capsule Take 1 capsule (100 mg total) by mouth 2 (two) times daily. 09/04/20   Jessy Oto, MD  DULoxetine (CYMBALTA) 30 MG capsule Take 1 capsule (30 mg total) by mouth daily. 02/01/22   Marcial Pacas, MD  glipiZIDE (GLUCOTROL) 5 MG tablet Take 1 tablet (5 mg total) by mouth 2 (two) times daily before a meal. 08/04/22   de [place], Blondell Reveal, MD  lisinopril (ZESTRIL) 20 MG tablet Take 1 tablet (20 mg total) by mouth daily. 01/25/22   Orma Render, NP  metFORMIN (GLUCOPHAGE) 1000 MG tablet Take 1 tablet (1,000 mg total) by mouth 2 (two) times daily (in the morning and evening with a meal). 01/25/22    Orma Render, NP  Multiple Vitamin (MULTIVITAMIN WITH MINERALS) TABS tablet Take 1 tablet by mouth in the morning.    [provider]  omega-3 acid ethyl esters (LOVAZA) 1 g capsule Take 1 g by mouth daily.    [provider]  ondansetron (ZOFRAN-ODT) 8 MG disintegrating tablet Take 1 tablet (8 mg total) by mouth every 8 (eight) hours as needed for nausea. 03/29/21   Orma Render, NP  pantoprazole (PROTONIX) 40 MG tablet Take 1 tablet (40 mg total) by mouth 2 (two) times daily. 01/25/22   Orma Render, NP  polyethylene glycol (MIRALAX / GLYCOLAX) 17 g packet Take 17 g by mouth 2 (two) times daily. Take twice a day every day while on pain medications. Then may take as needed. 01/25/22   Orma Render, NP  pregabalin (LYRICA) 75 MG capsule Take 1 capsule (75 mg total) by mouth 2 (two) times daily. 06/27/22 08/26/22  Callie Fielding, MD  sucralfate (CARAFATE) 1 g tablet Take 1 tablet (1 g total) by mouth 4 (four) times daily. 01/25/22   Orma Render, NP      Allergies    Patient has no known allergies.    Review of Systems   Review of Systems  Gastrointestinal:  Positive for abdominal pain.  All other systems reviewed and are negative.  Physical Exam Updated Vital Signs BP 120/63   Pulse 72   Temp 98.9 F (37.2 C)   Resp 17   SpO2 98%  Physical Exam Vitals and nursing note reviewed.   72 year old female, resting comfortably and in no acute distress. Vital signs are normal. Oxygen saturation is 98%, which is normal. Head is normocephalic and atraumatic. PERRLA, EOMI. Oropharynx is clear. Neck is nontender and supple without adenopathy or JVD. Back is nontender and there is no CVA tenderness. Lungs are clear without rales, wheezes, or rhonchi. Chest is nontender. Heart has regular rate and rhythm without murmur. Abdomen is soft, flat, nontender without masses or hepatosplenomegaly and peristalsis is normoactive. Extremities have no cyanosis or edema, full range of  motion is present. Skin is warm and dry without rash. Neurologic: Mental status is normal, cranial nerves are intact, there are no motor or sensory deficits.  ED Results / Procedures / Treatments   Labs (all labs ordered are listed, but only abnormal results are displayed) Labs Reviewed  CBC WITH DIFFERENTIAL/PLATELET - Abnormal; Notable for the following components:      Result Value   WBC 11.9 (*)    Hemoglobin 11.1 (*)    HCT 34.3 (*)    All other components within normal limits  COMPREHENSIVE METABOLIC PANEL - Abnormal; Notable for the following components:   Sodium 128 (*)    CO2 21 (*)    Glucose, Bld 177 (*)    Creatinine, Ser 1.23 (*)    Alkaline Phosphatase 170 (*)    GFR, Estimated 48 (*)    All other components within normal limits  LIPASE, BLOOD  URINALYSIS, ROUTINE W REFLEX MICROSCOPIC   Radiology No results found.  Procedures Procedures    Medications Ordered in ED Medications  sodium chloride 0.9 % bolus 1,000 mL (0 mLs Intravenous Stopped 08/06/22 0402)  famotidine (PEPCID) IVPB 20 mg premix (0 mg Intravenous Stopped 08/06/22 0124)  ondansetron (ZOFRAN) injection 4 mg (4 mg Intravenous Given 08/06/22 0050)  iohexol (OMNIPAQUE) 9 MG/ML oral solution 500 mL (500 mLs Oral Contrast Given 08/06/22 [id number])    ED Course/ Medical Decision Making/ A&P                             Medical Decision Making Amount and/or Complexity of Data Reviewed Radiology: ordered.  Risk Prescription drug management.   Chronic upper abdominal pain which is worsening.  This most likely is either GERD or peptic ulcer disease.  Consider cholecystitis, pancreatitis, diverticulitis, occult neoplasm.  However, failure to respond to proton pump inhibitors and sucralfate is concerning.  I have reviewed her past records, and she had an abdominal ultrasound on 1117/2022 which was significant only for fatty infiltration of the liver.  I have reviewed and interpreted her laboratory test today,  and my interpretation is hyponatremia which is probably not clinically significant, borderline low CO2 which is chronic for her, mild renal insufficiency not significantly changed from 01/25/2022, mild anemia with hemoglobin having dropped compared with 01/25/2022 but in the range that she had been in prior to that, mild leukocytosis which is nonspecific, normal lipase.  I feel that the diagnostic test which is going to be most important for her is an upper endoscopy, but that does not need to be done on an emergent basis.  I have ordered a CT of abdomen and pelvis, also IV fluids and IV ondansetron.  She has noted significant improvement with  above-noted treatment.  CT is still pending.  Case is signed out to Dr. Doren Custard.  I anticipate discharge with prescriptions for famotidine and ondansetron if CT does not show any serious pathology.  Final Clinical Impression(s) / ED Diagnoses Final diagnoses:  Epigastric pain  Nausea and vomiting, unspecified vomiting type  Hyponatremia  Elevated alkaline phosphatase level  Renal insufficiency  Normochromic normocytic anemia    Rx / DC Orders ED Discharge Orders          Ordered    famotidine (PEPCID) 20 MG tablet  Daily at bedtime        08/06/22 0729    ondansetron (ZOFRAN-ODT) 8 MG disintegrating tablet  Every 8 hours PRN        08/06/22 [id number]              Delora Fuel, MD [id number] [phone]

## 2022-08-08 ENCOUNTER — Encounter: Payer: Self-pay | Admitting: Nurse Practitioner

## 2022-08-09 ENCOUNTER — Other Ambulatory Visit: Payer: Self-pay

## 2022-09-16 DIAGNOSIS — R202 Paresthesia of skin: Secondary | ICD-10-CM | POA: Insufficient documentation

## 2022-09-16 DIAGNOSIS — R2689 Other abnormalities of gait and mobility: Secondary | ICD-10-CM | POA: Insufficient documentation

## 2022-09-16 DIAGNOSIS — R29898 Other symptoms and signs involving the musculoskeletal system: Secondary | ICD-10-CM | POA: Insufficient documentation

## 2022-09-20 ENCOUNTER — Other Ambulatory Visit: Payer: Self-pay

## 2022-09-20 ENCOUNTER — Encounter: Payer: Self-pay | Admitting: Nurse Practitioner

## 2022-09-20 ENCOUNTER — Ambulatory Visit (INDEPENDENT_AMBULATORY_CARE_PROVIDER_SITE_OTHER): Payer: Self-pay | Admitting: Nurse Practitioner

## 2022-09-20 ENCOUNTER — Other Ambulatory Visit (HOSPITAL_BASED_OUTPATIENT_CLINIC_OR_DEPARTMENT_OTHER): Payer: Self-pay

## 2022-09-20 ENCOUNTER — Other Ambulatory Visit (INDEPENDENT_AMBULATORY_CARE_PROVIDER_SITE_OTHER): Payer: Self-pay

## 2022-09-20 VITALS — BP 136/70 | HR 82 | Ht 61.0 in | Wt 181.0 lb

## 2022-09-20 DIAGNOSIS — K219 Gastro-esophageal reflux disease without esophagitis: Secondary | ICD-10-CM

## 2022-09-20 DIAGNOSIS — R748 Abnormal levels of other serum enzymes: Secondary | ICD-10-CM

## 2022-09-20 DIAGNOSIS — D649 Anemia, unspecified: Secondary | ICD-10-CM

## 2022-09-20 LAB — BASIC METABOLIC PANEL
BUN: 23 mg/dL (ref 6–23)
CO2: 25 mEq/L (ref 19–32)
Calcium: 9.8 mg/dL (ref 8.4–10.5)
Chloride: 104 mEq/L (ref 96–112)
Creatinine, Ser: 1.07 mg/dL (ref 0.40–1.20)
GFR: 52.86 mL/min — ABNORMAL LOW (ref >60.00)
Glucose, Bld: 158 mg/dL — ABNORMAL HIGH (ref 70–99)
Potassium: 5.1 mEq/L (ref 3.5–5.1)
Sodium: 137 mEq/L (ref 135–145)

## 2022-09-20 LAB — CBC
HCT: 33.1 % — ABNORMAL LOW (ref 36.0–46.0)
Hemoglobin: 10.8 g/dL — ABNORMAL LOW (ref 12.0–15.0)
MCHC: 32.8 g/dL (ref 30.0–36.0)
MCV: 84.2 fl (ref 78.0–100.0)
Platelets: 309 10*3/uL (ref 150.0–400.0)
RBC: 3.93 Mil/uL (ref 3.87–5.11)
RDW: 14.2 % (ref 11.5–15.5)
WBC: 11.4 10*3/uL — ABNORMAL HIGH (ref 4.0–10.5)

## 2022-09-20 LAB — HEPATIC FUNCTION PANEL
ALT: 13 U/L (ref 0–35)
AST: 10 U/L (ref 0–37)
Albumin: 4.2 g/dL (ref 3.5–5.2)
Alkaline Phosphatase: 157 U/L — ABNORMAL HIGH (ref 39–117)
Bilirubin, Direct: 0 mg/dL (ref 0.0–0.3)
Total Bilirubin: 0.3 mg/dL (ref 0.2–1.2)
Total Protein: 7.6 g/dL (ref 6.0–8.3)

## 2022-09-20 LAB — IBC PANEL
Iron: 29 ug/dL — ABNORMAL LOW (ref 42–145)
Saturation Ratios: 8.9 % — ABNORMAL LOW (ref 20.0–50.0)
TIBC: 327.6 ug/dL (ref 250.0–450.0)
Transferrin: 234 mg/dL (ref 212.0–360.0)

## 2022-09-20 LAB — GAMMA GT: GGT: 21 U/L (ref 7–51)

## 2022-09-20 MED ORDER — OMEPRAZOLE 40 MG PO CPDR
40.0000 mg | DELAYED_RELEASE_CAPSULE | Freq: Every day | ORAL | 2 refills | Status: DC
  Filled 2022-09-20 (×2): qty 30, 30d supply, fill #0

## 2022-09-20 NOTE — Patient Instructions (Addendum)
Le han programado una endoscopia y Neomia Dear colonoscopia. Siga las instrucciones escritas que se le dieron en su visita de hoy. Recoja sus suministros de preparacin en la farmacia dentro de los prximos 1 a 3 das. Si [place] inhaladores (incluso solo cuando sea necesario), trigalos con usted el da de su procedimiento.  Su proveedor le ha solicitado que vaya al stano para Education officer, environmental anlisis de laboratorio antes de irse hoy. Presione "B" en el ascensor. El laboratorio est ubicado en la primera puerta a la izquierda al salir del Medical sales representative.  Miralax- todas las noches segn sea necesario  Debido a cambios recientes en las leyes de atencin mdica, es posible que vea los resultados de sus estudios de imgenes y de laboratorio en MyChart antes de que su proveedor haya tenido la oportunidad de revisarlos. Entendemos que en algunos casos puede haber resultados que sean confusos o preocupantes para usted. No todos los resultados de laboratorio llegan en el mismo perodo de tiempo y es posible que el proveedor est esperando varios resultados para Administrator, Civil Service. Dnos 48 horas para que su proveedor revise minuciosamente todos los resultados antes de comunicarse con la oficina para Engineer, structural.  Gracias por confiarme tu cuidado gastrointestinal!  Alcide Evener, CRNP

## 2022-09-20 NOTE — Progress Notes (Signed)
09/20/2022 Julia Knight [id number] 26-Jan-1951   CHIEF COMPLAINT: Stomach pain  HISTORY OF PRESENT ILLNESS: Julia Knight is a 72 year old female with a past medical history of anxiety, depression, arthritis, hypertension, hyperlipidemia and GERD. She presents to our office today as referred by Dr. de [place] for further evaluation regarding possible hematemesis and GERD.  She is accompanied by a [place] Spanish interpreter to facilitate communication throughout today's office visit.  Her daughter is also present.  She endorses having epigastric and LUQ burning pain with bloat for several months. She has heartburn and sometimes feels as if food backs up into her esophagus.  She has intermittent vomiting, sometimes vomits daily.  She describes emesis as white or green foamy liquid.  On one or two occasions, emesis was coffee colored but not coffee ground like.  No frank hematemesis.  She previously took Pantoprazole twice daily and Carafate as needed.  She presented to the ED 08/05/2022 due to having epigastric pain.  CTAP was unrevealing.  She was prescribed Zofran and Pepcid and she was discharged home with recommendations to follow-up with GI. She reported losing 49 pounds over the past year.  She passes a normal formed brown bowel movement most days sometimes strains and passes a harder stool.  No rectal bleeding or black stools.  She takes ibuprofen 200 mg 2 tabs twice daily for neck, back and knee pain for the past 6 months.  No alcohol use.  She denies ever having an EGD or screening colonoscopy.       Latest Ref Rng & Units 09/20/2022   10:42 AM 08/05/2022    7:21 PM 01/25/2022    2:24 PM  CBC  WBC 4.0 - 10.5 K/uL 11.4  11.9  11.2   Hemoglobin 12.0 - 15.0 g/dL 40.9  81.1  91.4   Hematocrit 36.0 - 46.0 % 33.1  34.3  35.9   Platelets 150.0 - 400.0 K/uL 309.0  298  329         Latest Ref Rng & Units 09/20/2022   10:42 AM 08/05/2022    7:21 PM 01/25/2022     2:24 PM  CMP  Glucose 70 - 99 mg/dL 782  956  213   BUN 6 - 23 mg/dL 23  21  20    Creatinine 0.40 - 1.20 mg/dL 0.86  5.78  4.69   Sodium 135 - 145 mEq/L 137  128  138   Potassium 3.5 - 5.1 mEq/L 5.1  5.0  5.3   Chloride 96 - 112 mEq/L 104  98  95   CO2 19 - 32 mEq/L 25  21  17    Calcium 8.4 - 10.5 mg/dL 9.8  9.1  9.3   Total Protein 6.0 - 8.3 g/dL 7.6  7.3  7.6   Total Bilirubin 0.2 - 1.2 mg/dL 0.3  0.6  0.3   Alkaline Phos 39 - 117 U/L 157  170  202   AST 0 - 37 U/L 10  17  12    ALT 0 - 35 U/L 13  17  19      CTAP 08/06/2022 with and without contrast:  Lower chest:  No acute finding.  Coronary calcification.  A   Hepatobiliary: No focal liver abnormality. No evidence of biliary obstruction or stone.   Pancreas: Unremarkable.   Spleen: Unremarkable.   Adrenals/Urinary Tract: Negative adrenals. No hydronephrosis or stone. Unremarkable bladder.   Stomach/Bowel: No obstruction. Appendectomy. No bowel wall thickening.  Vascular/Lymphatic: No acute vascular abnormality. No mass or adenopathy.   Reproductive:No pathologic findings.   Other: No ascites or pneumoperitoneum.   Musculoskeletal: No acute abnormalities. L3-L5 solid fusion. Partial segmentation at S1-2. Advanced adjacent segment degeneration with scoliosis. Biforaminal stenosis at L1-2 and L2-3.   IMPRESSION: No acute finding.  Past Medical History:  Diagnosis Date   Acute blood loss anemia 09/10/2020   Allergic rhinitis 08/24/2011   Allergy    Anxiety    Arthritis    back and hands   Constipation due to opioid therapy 09/18/2020   COVID-19 11/16/2018   Depression    Diabetes mellitus without complication (HCC)    type 2   Dizziness 08/16/2017   Encounter for medical examination to establish care 07/24/2020   Gastroenteritis, infectious, presumed 08/16/2017   GERD (gastroesophageal reflux disease)    Glossitis 09/10/2020   Hyperlipemia    Hypertension    Neuromuscular disorder (HCC)    Past Surgical  History:  Procedure Laterality Date   APPENDECTOMY     CHOLECYSTECTOMY     TONSILLECTOMY     removed as an adult   Social History: She is widowed.  She has 3 sons and 2 daughters.  Past smoker, smoked 1 or 2 cigarettes daily x 30+ yrs quit smoking cigarettes one year ago.  No alcohol use.  No drug use.  Family History: No known family history of esophageal, gastric or colon cancer.  Brother with history of diabetes.  No Known Allergies    Outpatient Encounter Medications as of 09/20/2022  Medication Sig   atorvastatin (LIPITOR) 40 MG tablet Take 1 tablet (40 mg total) by mouth at bedtime.   calcium carbonate (OSCAL) 1500 (600 Ca) MG TABS tablet Take 600 mg of elemental calcium by mouth in the morning.   glipiZIDE (GLUCOTROL) 5 MG tablet Take 1 tablet (5 mg total) by mouth 2 (two) times daily before a meal.   lisinopril (ZESTRIL) 20 MG tablet Take 1 tablet (20 mg total) by mouth daily.   metFORMIN (GLUCOPHAGE) 1000 MG tablet Take 1 tablet (1,000 mg total) by mouth 2 (two) times daily (in the morning and evening with a meal).   Multiple Vitamin (MULTIVITAMIN WITH MINERALS) TABS tablet Take 1 tablet by mouth in the morning.   omega-3 acid ethyl esters (LOVAZA) 1 g capsule Take 1 g by mouth daily.   omeprazole (PRILOSEC) 40 MG capsule Take 1 capsule (40 mg total) by mouth daily. Take 30 mintues before breakfast.   [DISCONTINUED] docusate sodium (COLACE) 100 MG capsule Take 1 capsule (100 mg total) by mouth 2 (two) times daily.   [DISCONTINUED] DULoxetine (CYMBALTA) 30 MG capsule Take 1 capsule (30 mg total) by mouth daily. (Patient not taking: Reported on 09/20/2022)   [DISCONTINUED] famotidine (PEPCID) 20 MG tablet Take 1 tablet (20 mg total) by mouth at bedtime. (Patient not taking: Reported on 09/20/2022)   [DISCONTINUED] ondansetron (ZOFRAN-ODT) 8 MG disintegrating tablet Take 1 tablet (8 mg total) by mouth every 8 (eight) hours as needed for nausea. (Patient not taking: Reported on  09/20/2022)   [DISCONTINUED] pantoprazole (PROTONIX) 40 MG tablet Take 1 tablet (40 mg total) by mouth 2 (two) times daily. (Patient not taking: Reported on 09/20/2022)   [DISCONTINUED] polyethylene glycol (MIRALAX / GLYCOLAX) 17 g packet Take 17 g by mouth 2 (two) times daily. Take twice a day every day while on pain medications. Then may take as needed. (Patient not taking: Reported on 09/20/2022)   [DISCONTINUED] pregabalin (LYRICA) 75 MG  capsule Take 1 capsule (75 mg total) by mouth 2 (two) times daily.   [DISCONTINUED] sucralfate (CARAFATE) 1 g tablet Take 1 tablet (1 g total) by mouth 4 (four) times daily. (Patient not taking: Reported on 09/20/2022)   No facility-administered encounter medications on file as of 09/20/2022.    REVIEW OF SYSTEMS:  Gen: Denies fever, sweats or chills. No weight loss.  CV: Denies chest pain, palpitations or edema. Resp: Denies cough, shortness of breath of hemoptysis.  GI: Denies heartburn, dysphagia, stomach or lower abdominal pain. No diarrhea or constipation.  GU: + Excessive urination. MS: + Arthritis and back pain.  Derm: Denies rash, itchiness, skin lesions or unhealing ulcers. Psych: Denies depression, anxiety, memory loss or confusion. Heme: Denies bruising, easy bleeding. Neuro:  Denies headaches, dizziness or paresthesias. Endo:  Denies any problems with DM, thyroid or adrenal function.  PHYSICAL EXAM: BP 136/70   Pulse 82   Ht 5\' 1"  (1.549 m)   Wt 181 lb (82.1 kg)   SpO2 94%   BMI 34.20 kg/m   General: 72 year old female in no acute distress. Head: Normocephalic and atraumatic. Eyes:  Sclerae non-icteric, conjunctive pink. Ears: Normal auditory acuity. Mouth: Dentition intact. No ulcers or lesions.  Neck: Supple, no lymphadenopathy or thyromegaly.  Lungs: Clear bilaterally to auscultation without wheezes, crackles or rhonchi. Heart: Regular rate and rhythm. No murmur, rub or gallop appreciated.  Abdomen: Soft, nontender, nondistended.  No masses. No hepatosplenomegaly. Normoactive bowel sounds x 4 quadrants.  Rectal: Deferred.  Musculoskeletal: Symmetrical with no gross deformities. Skin: Warm and dry. No rash or lesions on visible extremities. Extremities: No edema. Neurological: Alert oriented x 4, no focal deficits.  Psychological:  Alert and cooperative. Normal mood and affect.  ASSESSMENT AND PLAN:  72 year old female with heartburn, epigastric and LUQ pain. + Chronic NSAID use.  -EGD to rule out GERD/PUD benefits and risks discussed including risk with sedation, risk of bleeding, perforation and infection  -GERD diet -Omeprazole 40 mg 1 p.o. daily to be taken 30 minutes before breakfast  Colon cancer screening -Colonoscopy benefits and risks discussed including risk with sedation, risk of bleeding, perforation and infection   Occasional constipation -MiraLAX nightly as needed  Mild anemia  -CBC, IBC + ferritin and BMP  Elevated alk phophatase level. Normal T. Bili, AST/ALT levels. CTAP 08/06/2022 showed a normal liver without biliary ductal dilatation.  -Hepatic panel, GGT        CC:  de [place], Buren Kos, MD

## 2022-09-22 NOTE — Progress Notes (Signed)
I agree with the assessment and plan as outlined by Ms. Kennedy-Smith. 

## 2022-09-23 ENCOUNTER — Other Ambulatory Visit: Payer: Self-pay

## 2022-09-23 ENCOUNTER — Other Ambulatory Visit (HOSPITAL_BASED_OUTPATIENT_CLINIC_OR_DEPARTMENT_OTHER): Payer: Self-pay

## 2022-09-23 DIAGNOSIS — D649 Anemia, unspecified: Secondary | ICD-10-CM

## 2022-09-23 MED ORDER — IRON (FERROUS SULFATE) 325 (65 FE) MG PO TABS
325.0000 mg | ORAL_TABLET | Freq: Every day | ORAL | 0 refills | Status: DC
  Filled 2022-09-23: qty 30, fill #0

## 2022-10-04 ENCOUNTER — Encounter (HOSPITAL_BASED_OUTPATIENT_CLINIC_OR_DEPARTMENT_OTHER): Payer: Self-pay | Admitting: Family Medicine

## 2022-10-07 ENCOUNTER — Other Ambulatory Visit: Payer: Self-pay

## 2022-10-10 ENCOUNTER — Ambulatory Visit: Payer: Self-pay | Admitting: Orthopedic Surgery

## 2022-10-13 ENCOUNTER — Encounter (HOSPITAL_BASED_OUTPATIENT_CLINIC_OR_DEPARTMENT_OTHER): Payer: Self-pay | Admitting: Family Medicine

## 2022-10-13 ENCOUNTER — Encounter: Payer: Self-pay | Admitting: Internal Medicine

## 2022-10-13 ENCOUNTER — Ambulatory Visit (AMBULATORY_SURGERY_CENTER): Payer: Self-pay | Admitting: Internal Medicine

## 2022-10-13 ENCOUNTER — Other Ambulatory Visit (HOSPITAL_BASED_OUTPATIENT_CLINIC_OR_DEPARTMENT_OTHER): Payer: Self-pay

## 2022-10-13 ENCOUNTER — Ambulatory Visit (INDEPENDENT_AMBULATORY_CARE_PROVIDER_SITE_OTHER): Payer: Self-pay | Admitting: Family Medicine

## 2022-10-13 ENCOUNTER — Ambulatory Visit (INDEPENDENT_AMBULATORY_CARE_PROVIDER_SITE_OTHER): Payer: Self-pay | Admitting: Orthopedic Surgery

## 2022-10-13 VITALS — BP 151/62 | HR 73 | Temp 97.7°F | Ht 61.0 in | Wt 176.7 lb

## 2022-10-13 VITALS — BP 142/79

## 2022-10-13 VITALS — BP 117/51 | HR 64 | Temp 98.6°F | Resp 14 | Ht 61.0 in | Wt 181.0 lb

## 2022-10-13 DIAGNOSIS — K319 Disease of stomach and duodenum, unspecified: Secondary | ICD-10-CM

## 2022-10-13 DIAGNOSIS — K209 Esophagitis, unspecified without bleeding: Secondary | ICD-10-CM

## 2022-10-13 DIAGNOSIS — K2289 Other specified disease of esophagus: Secondary | ICD-10-CM

## 2022-10-13 DIAGNOSIS — K2970 Gastritis, unspecified, without bleeding: Secondary | ICD-10-CM

## 2022-10-13 DIAGNOSIS — M5416 Radiculopathy, lumbar region: Secondary | ICD-10-CM

## 2022-10-13 DIAGNOSIS — Z0000 Encounter for general adult medical examination without abnormal findings: Secondary | ICD-10-CM | POA: Insufficient documentation

## 2022-10-13 DIAGNOSIS — R748 Abnormal levels of other serum enzymes: Secondary | ICD-10-CM

## 2022-10-13 DIAGNOSIS — K219 Gastro-esophageal reflux disease without esophagitis: Secondary | ICD-10-CM

## 2022-10-13 DIAGNOSIS — D649 Anemia, unspecified: Secondary | ICD-10-CM

## 2022-10-13 DIAGNOSIS — E1165 Type 2 diabetes mellitus with hyperglycemia: Secondary | ICD-10-CM

## 2022-10-13 DIAGNOSIS — K2980 Duodenitis without bleeding: Secondary | ICD-10-CM

## 2022-10-13 HISTORY — PX: UPPER GASTROINTESTINAL ENDOSCOPY: SHX188

## 2022-10-13 MED ORDER — IRON (FERROUS SULFATE) 325 (65 FE) MG PO TABS
325.0000 mg | ORAL_TABLET | ORAL | 0 refills | Status: DC
  Filled 2022-10-13: qty 30, 70d supply, fill #0

## 2022-10-13 MED ORDER — GLIPIZIDE 5 MG PO TABS
5.0000 mg | ORAL_TABLET | Freq: Two times a day (BID) | ORAL | 1 refills | Status: DC
  Filled 2022-10-13: qty 180, 90d supply, fill #0

## 2022-10-13 MED ORDER — OMEPRAZOLE 40 MG PO CPDR
40.0000 mg | DELAYED_RELEASE_CAPSULE | Freq: Every day | ORAL | 3 refills | Status: DC
  Filled 2022-10-13: qty 90, 90d supply, fill #0

## 2022-10-13 MED ORDER — SODIUM CHLORIDE 0.9 % IV SOLN
500.0000 mL | Freq: Once | INTRAVENOUS | Status: DC
Start: 2022-10-13 — End: 2022-10-13

## 2022-10-13 MED ORDER — OMEPRAZOLE 40 MG PO CPDR
40.0000 mg | DELAYED_RELEASE_CAPSULE | Freq: Two times a day (BID) | ORAL | 1 refills | Status: DC
  Filled 2022-10-13: qty 120, 60d supply, fill #0
  Filled 2022-10-13: qty 60, 30d supply, fill #0

## 2022-10-13 MED ORDER — METHOCARBAMOL 500 MG PO TABS
500.0000 mg | ORAL_TABLET | Freq: Four times a day (QID) | ORAL | 0 refills | Status: DC | PRN
  Filled 2022-10-13: qty 50, 13d supply, fill #0

## 2022-10-13 MED ORDER — METFORMIN HCL 1000 MG PO TABS
1000.0000 mg | ORAL_TABLET | Freq: Two times a day (BID) | ORAL | 3 refills | Status: DC
  Filled 2022-10-13: qty 180, 90d supply, fill #0

## 2022-10-13 NOTE — Progress Notes (Signed)
Interpreter used today at the [facility] for this pt.  Interpreter's name is-Mariel Ronie Spies

## 2022-10-13 NOTE — Patient Instructions (Signed)
  Medication Instructions:  Your physician recommends that you continue on your current medications as directed. Please refer to the Current Medication list given to you today. --If you need a refill on any your medications before your next appointment, please call your pharmacy first. If no refills are authorized on file call the office.-- Lab Work: Your physician has recommended that you have lab work today: No If you have labs (blood work) drawn today and your tests are completely normal, you will receive your results via MyChart message OR a phone call from our staff.  Please ensure you check your voicemail in the event that you authorized detailed messages to be left on a delegated number. If you have any lab test that is abnormal or we need to change your treatment, we will call you to review the results.  Referrals/Procedures/Imaging: Yes  Follow-Up: Your next appointment:   Your physician recommends that you schedule a follow-up appointment in: 2-3 months with Dr. de [place].  You will receive a text message or e-mail with a link to a survey about your care and experience with us today! We would greatly appreciate your feedback!   Thanks for letting us be apart of your health journey!!  Primary Care and Sports Medicine   Dr. Raymond de [place]   We encourage you to activate your patient portal called "MyChart".  Sign up information is provided on this After Visit Summary.  MyChart is used to connect with patients for Virtual Visits (Telemedicine).  Patients are able to view lab/test results, encounter notes, upcoming appointments, etc.  Non-urgent messages can be sent to your provider as well. To learn more about what you can do with MyChart, please visit --  [email or web address].    

## 2022-10-13 NOTE — Progress Notes (Signed)
GASTROENTEROLOGY PROCEDURE H&P NOTE   Primary Care Physician: de [place], Buren Kos, MD    Reason for Procedure:   Epigastric and LUQ ab pain, GERD, IDA  Plan:    EGD  Patient is appropriate for endoscopic procedure(s) in the ambulatory ([facility]) setting.  The nature of the procedure, as well as the risks, benefits, and alternatives were carefully and thoroughly reviewed with the patient. Ample time for discussion and questions allowed. The patient understood, was satisfied, and agreed to proceed.     HPI: Julia Knight is a 72 y.o. female who presents for EGD for evaluation of epigastric and LUQ ab pain, GERD, IDA .  Patient was most recently seen in the Gastroenterology Clinic on 09/20/22.  No interval change in medical history since that appointment. Please refer to that note for full details regarding GI history and clinical presentation.   Past Medical History:  Diagnosis Date   Acute blood loss anemia 09/10/2020   Allergic rhinitis 08/24/2011   Allergy    Anxiety    Arthritis    back and hands   Constipation due to opioid therapy 09/18/2020   COVID-19 11/16/2018   Depression    Diabetes mellitus without complication (HCC)    type 2   Dizziness 08/16/2017   Encounter for medical examination to establish care 07/24/2020   Gastroenteritis, infectious, presumed 08/16/2017   GERD (gastroesophageal reflux disease)    Glossitis 09/10/2020   Hyperlipemia    Hypertension    Neuromuscular disorder (HCC)     Past Surgical History:  Procedure Laterality Date   APPENDECTOMY     CHOLECYSTECTOMY     TONSILLECTOMY     removed as an adult    Prior to Admission medications   Medication Sig Start Date End Date Taking? Authorizing Provider  atorvastatin (LIPITOR) 40 MG tablet Take 1 tablet (40 mg total) by mouth at bedtime. 01/25/22   Tollie Eth, NP  calcium carbonate (OSCAL) 1500 (600 Ca) MG TABS tablet Take 600 mg of elemental calcium by mouth in the morning.    [provider]  glipiZIDE (GLUCOTROL) 5 MG tablet Take 1 tablet (5 mg total) by mouth 2 (two) times daily before a meal. 10/13/22   de [place], Buren Kos, MD  Iron, Ferrous Sulfate, 325 (65 Fe) MG TABS Take 1 tablet by mouth every Monday, Wednesday, and Friday. 10/14/22   de [place], Buren Kos, MD  lisinopril (ZESTRIL) 20 MG tablet Take 1 tablet (20 mg total) by mouth daily. 01/25/22   Tollie Eth, NP  metFORMIN (GLUCOPHAGE) 1000 MG tablet Take 1 tablet (1,000 mg total) by mouth 2 (two) times daily (in the morning and evening with a meal). 10/13/22   de [place], Buren Kos, MD  methocarbamol (ROBAXIN) 500 MG tablet Take 1 tablet (500 mg total) by mouth every 6 (six) hours as needed (pain, muscle spasms/cramps). 10/13/22   London Sheer, MD  Multiple Vitamin (MULTIVITAMIN WITH MINERALS) TABS tablet Take 1 tablet by mouth in the morning.    [provider]  omega-3 acid ethyl esters (LOVAZA) 1 g capsule Take 1 g by mouth daily.    [provider]  omeprazole (PRILOSEC) 40 MG capsule Take 1 capsule (40 mg total) by mouth daily. Take 30 mintues before breakfast. 09/20/22   Arnaldo Natal, NP    Current Outpatient Medications  Medication Sig Dispense Refill   atorvastatin (LIPITOR) 40 MG tablet Take 1 tablet (40 mg total) by mouth at bedtime.  90 tablet 3   calcium carbonate (OSCAL) 1500 (600 Ca) MG TABS tablet Take 600 mg of elemental calcium by mouth in the morning.     glipiZIDE (GLUCOTROL) 5 MG tablet Take 1 tablet (5 mg total) by mouth 2 (two) times daily before a meal. 180 tablet 1   [START ON 10/14/2022] Iron, Ferrous Sulfate, 325 (65 Fe) MG TABS Take 1 tablet by mouth every Monday, Wednesday, and Friday. 30 tablet 0   lisinopril (ZESTRIL) 20 MG tablet Take 1 tablet (20 mg total) by mouth daily. 90 tablet 3   metFORMIN (GLUCOPHAGE) 1000 MG tablet Take 1 tablet (1,000 mg total) by mouth 2 (two) times daily (in the morning and evening with a meal). 180 tablet 3   methocarbamol (ROBAXIN)  500 MG tablet Take 1 tablet (500 mg total) by mouth every 6 (six) hours as needed (pain, muscle spasms/cramps). 50 tablet 0   Multiple Vitamin (MULTIVITAMIN WITH MINERALS) TABS tablet Take 1 tablet by mouth in the morning.     omega-3 acid ethyl esters (LOVAZA) 1 g capsule Take 1 g by mouth daily.     omeprazole (PRILOSEC) 40 MG capsule Take 1 capsule (40 mg total) by mouth daily. Take 30 mintues before breakfast. 30 capsule 2   Current Facility-Administered Medications  Medication Dose Route Frequency Provider Last Rate Last Admin   0.9 %  sodium chloride infusion  500 mL Intravenous Once Imogene Burn, MD        Allergies as of 10/13/2022   (No Known Allergies)    Family History  Problem Relation Age of Onset   Diabetes Brother     Social History   Socioeconomic History   Marital status: Widowed    Spouse name: Not on file   Number of children: Not on file   Years of education: Not on file   Highest education level: Not on file  Occupational History   Not on file  Tobacco Use   Smoking status: Former    Types: Cigarettes   Smokeless tobacco: Never  Vaping Use   Vaping Use: Never used  Substance and Sexual Activity   Alcohol use: Never   Drug use: Never   Sexual activity: Not Currently  Other Topics Concern   Not on file  Social History Narrative   ** Merged History Encounter **       Social Determinants of Health   Financial Resource Strain: Not on file  Food Insecurity: Not on file  Transportation Needs: Not on file  Physical Activity: Not on file  Stress: Not on file  Social Connections: Not on file  Intimate Partner Violence: Not on file    Physical Exam: Vital signs in last 24 hours: BP (!) 147/72   Pulse 74   Temp 98.6 F (37 C) (Other (Comment)) Comment (Src): forehead  Ht 5\' 1"  (1.549 m)   Wt 181 lb (82.1 kg)   SpO2 96%   BMI 34.20 kg/m  GEN: NAD EYE: Sclerae anicteric ENT: MMM CV: Non-tachycardic Pulm: No increased WOB GI:  Soft NEURO:  Alert & Oriented   Eulah Pont, MD [facility]   10/13/2022 2:55 PM

## 2022-10-13 NOTE — Progress Notes (Signed)
Subjective:    CC: Annual Physical Exam  HPI:  Otis Ternes is a 72 y.o. presenting for annual physical  I reviewed the past medical history, family history, social history, surgical history, and allergies today and no changes were needed.  Please see the problem list section below in epic for further details.  Past Medical History: Past Medical History:  Diagnosis Date   Acute blood loss anemia 09/10/2020   Allergic rhinitis 08/24/2011   Allergy    Anxiety    Arthritis    back and hands   Constipation due to opioid therapy 09/18/2020   COVID-19 11/16/2018   Depression    Diabetes mellitus without complication (HCC)    type 2   Dizziness 08/16/2017   Encounter for medical examination to establish care 07/24/2020   Gastroenteritis, infectious, presumed 08/16/2017   GERD (gastroesophageal reflux disease)    Glossitis 09/10/2020   Hyperlipemia    Hypertension    Neuromuscular disorder (HCC)    Past Surgical History: Past Surgical History:  Procedure Laterality Date   APPENDECTOMY     CHOLECYSTECTOMY     TONSILLECTOMY     removed as an adult   Social History: Social History   Socioeconomic History   Marital status: Widowed    Spouse name: Not on file   Number of children: Not on file   Years of education: Not on file   Highest education level: Not on file  Occupational History   Not on file  Tobacco Use   Smoking status: Former    Types: Cigarettes   Smokeless tobacco: Never  Vaping Use   Vaping Use: Never used  Substance and Sexual Activity   Alcohol use: Never   Drug use: Never   Sexual activity: Not Currently  Other Topics Concern   Not on file  Social History Narrative   ** Merged History Encounter **       Social Determinants of Health   Financial Resource Strain: Not on file  Food Insecurity: Not on file  Transportation Needs: Not on file  Physical Activity: Not on file  Stress: Not on file  Social Connections: Not on file   Family  History: Family History  Problem Relation Age of Onset   Diabetes Brother    Allergies: No Known Allergies Medications: See med rec.  Review of Systems: No headache, visual changes, nausea, vomiting, diarrhea, constipation, dizziness, abdominal pain, skin rash, fevers, chills, night sweats, swollen lymph nodes, weight loss, chest pain, body aches, joint swelling, muscle aches, shortness of breath, mood changes, visual or auditory hallucinations.  Objective:    BP (!) 151/62 (BP Location: Right Arm, Patient Position: Sitting, Cuff Size: Normal)   Pulse 73   Temp 97.7 F (36.5 C) (Oral)   Ht 5\' 1"  (1.549 m)   Wt 176 lb 11.2 oz (80.2 kg)   SpO2 99%   BMI 33.39 kg/m   General: Well Developed, well nourished, and in no acute distress. Neuro: Alert and oriented x3, extra-ocular muscles intact, sensation grossly intact. Cranial nerves II through XII are intact, motor, sensory, and coordinative functions are all intact. HEENT: Normocephalic, atraumatic, pupils equal round reactive to light, neck supple, no masses, no lymphadenopathy, thyroid nonpalpable. Oropharynx, nasopharynx, external ear canals are unremarkable. Skin: Warm and dry, no rashes noted. Cardiac: Regular rate and rhythm, no murmurs rubs or gallops. Respiratory: Clear to auscultation bilaterally. Not using accessory muscles, speaking in full sentences. Abdominal: Soft, nontender, nondistended, positive bowel sounds, no masses, no organomegaly.  Musculoskeletal: Shoulder, elbow, wrist, hip, knee, ankle stable, and with full range of motion.  Impression and Recommendations:    Wellness examination Routine HCM labs ordered. HCM reviewed/discussed. Anticipatory guidance regarding healthy weight, lifestyle and choices given. Recommend healthy diet.  Recommend approximately 150 minutes/week of moderate intensity exercise Recommend regular dental and vision exams Always use seatbelt/lap and shoulder restraints Recommend using  smoke alarms and checking batteries at least twice a year Recommend using sunscreen when outside Discussed colon cancer screening recommendations, options.  Patient has upcoming procedure with GI Discussed immunization recommendations  T2DM (type 2 diabetes mellitus) (HCC) Recent A1c elevated at 9.9%. Long discussion previously about meds, unfortunately, still did not start glipizide as prescribed. Needing to get A1c down for orthopedic surgery. New prescriptions for both metformin and glipizide - she has been taking metformin Provided contact info for Dr. Talmage Nap, referral placed last appt Complete foot exam at next appointment Referral to ophtho today  Return in about 2 months (around 12/13/2022) for DM. NV in 3-4 weeks for labs.   ___________________________________________ Yago Ludvigsen de [place], MD, ABFM, CAQSM Primary Care and Sports Medicine [facility]

## 2022-10-13 NOTE — Progress Notes (Signed)
Called to room to assist during endoscopic procedure.  Patient ID and intended procedure confirmed with present staff. Received instructions for my participation in the procedure from the performing physician.  

## 2022-10-13 NOTE — Assessment & Plan Note (Signed)
Routine HCM labs ordered. HCM reviewed/discussed. Anticipatory guidance regarding healthy weight, lifestyle and choices given. Recommend healthy diet.  Recommend approximately 150 minutes/week of moderate intensity exercise Recommend regular dental and vision exams Always use seatbelt/lap and shoulder restraints Recommend using smoke alarms and checking batteries at least twice a year Recommend using sunscreen when outside Discussed colon cancer screening recommendations, options.  Patient has upcoming procedure with GI Discussed immunization recommendations

## 2022-10-13 NOTE — Progress Notes (Signed)
Patient ID: Julia Knight, female   DOB: 1951/01/17, 72 y.o.   MRN: [id number]  Interpreter used for recovery care: Nile Riggs.  Lina Sar, RN

## 2022-10-13 NOTE — Op Note (Signed)
[facility] Patient Name: Julia Knight Procedure Date: 10/13/2022 2:09 PM MRN: [id number] Endoscopist: Madelyn Brunner [place] , , [id number] Age: 72 Referring MD:  Date of Birth: [birth year] Gender: Female Account #: [id number] Procedure:                Upper GI endoscopy Indications:              Epigastric abdominal pain, Abdominal pain in the                            left upper quadrant, Iron deficiency anemia,                            Heartburn Medicines:                Monitored Anesthesia Care Procedure:                Pre-Anesthesia Assessment:                           - Prior to the procedure, a History and Physical                            was performed, and patient medications and                            allergies were reviewed. The patient's tolerance of                            previous anesthesia was also reviewed. The risks                            and benefits of the procedure and the sedation                            options and risks were discussed with the patient.                            All questions were answered, and informed consent                            was obtained. Prior Anticoagulants: The patient has                            taken no anticoagulant or antiplatelet agents. ASA                            Grade Assessment: III - A patient with severe                            systemic disease. After reviewing the risks and                            benefits, the patient was deemed in satisfactory  condition to undergo the procedure.                           After obtaining informed consent, the endoscope was                            passed under direct vision. Throughout the                            procedure, the patient's blood pressure, pulse, and                            oxygen saturations were monitored continuously. The                            Olympus Scope [id number] was  introduced through the                            mouth, and advanced to the second part of duodenum.                            The upper GI endoscopy was accomplished without                            difficulty. The patient tolerated the procedure                            well. Scope In: Scope Out: Findings:                 Esophagitis with no bleeding was found in the                            distal esophagus. Biopsies were taken with a cold                            forceps for histology.                           Localized mild inflammation characterized by                            congestion (edema), erosions and erythema was found                            in the gastric antrum. Biopsies were taken with a                            cold forceps for histology.                           The examined duodenum was normal. Biopsies were                            taken with a cold forceps for histology. Complications:  No immediate complications. Estimated Blood Loss:     Estimated blood loss was minimal. Impression:               - Esophagitis with no bleeding. Biopsied.                           - Gastritis. Biopsied.                           - Normal examined duodenum. Biopsied. Recommendation:           - Discharge patient to home (with escort).                           - Await pathology results.                           - Use Prilosec (omeprazole) 40 mg PO BID for 10                            weeks.                           - Will need to have her colonoscopy rescheduled                            with previsit for instructions on bowel prep.                           - Will plan for further evaluation with HIDA scan.                           - The findings and recommendations were discussed                            with the patient. Dr Particia Lather "Alan Ripper" Leonides Schanz,  10/13/2022 3:34:40 PM

## 2022-10-13 NOTE — Patient Instructions (Addendum)
Recommendation: - Discharge patient to home (with escort).                           - Await pathology results.                           - Use Prilosec (omeprazole) 40 mg PO BID for 10                            weeks.                           - Will need to have her colonoscopy rescheduled                            with previsit for instructions on bowel prep.                           - Will plan for further evaluation with HIDA scan.                           - The findings and recommendations were discussed                            with the patient.  USTED TUVO UN PROCEDIMIENTO ENDOSCPICO HOY EN EL [facility]:   Lea el informe del procedimiento que se le entreg para cualquier pregunta especfica sobre lo que se Dentist.  Si el informe del examen no responde a sus preguntas, por favor llame a su gastroenterlogo para aclararlo.  Si usted solicit que no se le den [employer] de lo que se Clinical cytogeneticist en su procedimiento al [employer] va a cuidar, entonces el informe del procedimiento se ha incluido en un sobre sellado para que usted lo revise despus cuando le sea ms conveniente.   LO QUE PUEDE ESPERAR: Algunas sensaciones de hinchazn en el abdomen.  Puede tener ms gases de lo normal.  El caminar puede ayudarle a eliminar el aire que se le puso en el tracto gastrointestinal durante el procedimiento y reducir la hinchazn.  Si le hicieron una endoscopia inferior (como una colonoscopia o una sigmoidoscopia flexible), podra notar manchas de sangre en las heces fecales o en el papel higinico.  Si se someti a una preparacin intestinal para su procedimiento, es posible que no tenga una evacuacin intestinal normal durante [employer].   Tenga en cuenta:  Es posible que note un poco de irritacin y congestin en la nariz o algn drenaje.  Esto es debido al oxgeno [employer] durante su procedimiento.  No hay que preocuparse y esto debe desaparecer ms o Office manager.   SNTOMAS PARA REPORTAR INMEDIATAMENTE:  Despus de una endoscopia inferior (colonoscopia o sigmoidoscopia flexible):  Cantidades excesivas de sangre en las heces fecales  Sensibilidad significativa o empeoramiento de los dolores abdominales   Hinchazn aguda del abdomen que antes no tena   Fiebre de 100F o ms   Despus de la endoscopia superior (EGD)  Vmitos de Retail buyer o material como caf molido   Dolor en el pecho o dolor debajo de los omplatos que antes no tena  Dolor o dificultad persistente para tragar  Falta de aire que antes no tena   Fiebre de 100F o ms  Heces fecales negras y pegajosas   Para asuntos urgentes o de Associate Professor, puede comunicarse con un gastroenterlogo a cualquier hora llamando al [phone].  DIETA:  Recomendamos una comida pequea al principio, pero luego puede continuar con su dieta normal.  Tome muchos lquidos, Tax adviser las bebidas alcohlicas durante 24 horas.    ACTIVIDAD:  Debe planear tomarse las cosas con calma por el resto del da y no debe CONDUCIR ni usar maquinaria pesada Patent examiner (debido a los medicamentos de sedacin utilizados durante el examen).     SEGUIMIENTO: Nuestro personal llamar al nmero que aparece en su historial al siguiente da hbil de su procedimiento para ver cmo se siente y para responder cualquier pregunta o inquietud que pueda tener con respecto a la informacin que se le dio despus del procedimiento. Si no podemos contactarle, le dejaremos un mensaje.  Sin embargo, si se siente bien y no tiene English as a second language teacher, no es necesario que nos devuelva la llamada.  Asumiremos que ha regresado a sus actividades diarias normales sin incidentes. Si se le tomaron algunas biopsias, le contactaremos por telfono o por carta en las prximas 3 semanas.  Si no ha sabido [employer] biopsias en el transcurso de 3 semanas, por favor llmenos al [phone].   FIRMAS/CONFIDENCIALIDAD: Usted y/o el  acompaante que le cuide han firmado documentos que se ingresarn en su historial mdico Forensic scientist.  Estas firmas atestiguan el hecho de que la informacin anterior YOU HAD AN ENDOSCOPIC PROCEDURE TODAY AT THE [facility]:   Refer to the procedure report that was given to you for any specific questions about what was found during the examination.  If the procedure report does not answer your questions, please call your gastroenterologist to clarify.  If you requested that your care partner not be given the details of your procedure findings, then the procedure report has been included in a sealed envelope for you to review at your convenience later.  YOU SHOULD EXPECT: Some feelings of bloating in the abdomen. Passage of more gas than usual.  Walking can help get rid of the air that was put into your GI tract during the procedure and reduce the bloating. If you had a lower endoscopy (such as a colonoscopy or flexible sigmoidoscopy) you may notice spotting of blood in your stool or on the toilet paper. If you underwent a bowel prep for your procedure, you may not have a normal bowel movement for a few days.  Please Note:  You might notice some irritation and congestion in your nose or some drainage.  This is from the oxygen used during your procedure.  There is no need for concern and it should clear up in a day or so.  SYMPTOMS TO REPORT IMMEDIATELY:  Following upper endoscopy (EGD)  Vomiting of blood or coffee ground material  New chest pain or pain under the shoulder blades  Painful or persistently difficult swallowing  New shortness of breath  Fever of 100F or higher  Black, tarry-looking stools  For urgent or emergent issues, a gastroenterologist can be reached at any hour by calling [phone]. Do not use MyChart messaging for urgent concerns.    DIET:  We do recommend a small meal at first, but then you may proceed to your regular diet.  Drink plenty of fluids but you  should avoid  alcoholic beverages for 24 hours.  ACTIVITY:  You should plan to take it easy for the rest of today and you should NOT DRIVE or use heavy machinery until tomorrow (because of the sedation medicines used during the test).    FOLLOW UP: Our staff will call the number listed on your records the next business day following your procedure.  We will call around 7:15- 8:00 am to check on you and address any questions or concerns that you may have regarding the information given to you following your procedure. If we do not reach you, we will leave a message.     If any biopsies were taken you will be contacted by phone or by letter within the next 1-3 weeks.  Please call us at [phone] if you have not heard about the biopsies in 3 weeks.    SIGNATURES/CONFIDENTIALITY: You and/or your care partner have signed paperwork which will be entered into your electronic medical record.  These signatures attest to the fact that that the information above on your After Visit Summary has been reviewed and is understood.  Full responsibility of the confidentiality of this discharge information lies with you and/or your care-partner.

## 2022-10-13 NOTE — Assessment & Plan Note (Addendum)
Recent A1c elevated at 9.9%. Long discussion previously about meds, unfortunately, still did not start glipizide as prescribed. Needing to get A1c down for orthopedic surgery. New prescriptions for both metformin and glipizide - she has been taking metformin Provided contact info for Dr. Talmage Nap, referral placed last appt Complete foot exam at next appointment Referral to ophtho today

## 2022-10-13 NOTE — Progress Notes (Signed)
Orthopedic Spine Surgery Office Note   Assessment: Patient is a 72 y.o. female with low back pain that radiates into her bilateral lower extremities. 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      Plan: -Patient has tried conservative treatments, but is not a good operative candidate at this time due to her elevated A1C -Discussed indirect decompression with L2/3 & L3/4 XLIF and extension of her posterior fusion to L2 -I told her that spinal cord stimulator would be an option as well since she has had prior decompression and is continue to have radicular type pain -Prescribed robaxin  -Would need an A1C of 7.5 or less prior to any elective spine surgery -Return to office in 6 weeks, x-rays at next visit: AP/lateral/flex/ex lumbar     Patient expressed understanding of the plan and all questions were answered to the patient's satisfaction.    __________________________________________________________________________   History: Patient is a 72 y.o. female who has been previously seen in the office for symptoms of lumbar radiculopathy. I had sent her to [facility] for concern of myelopathy with stenosis up into the upper cervical spine. They did not recommend operative intervention so she comes back in today to talk about her lumbar spine.  She is feeling pain in her low back and radiates into her bilateral lower extremities.  She feels it along the lateral and anterior aspect of the thighs bilaterally.  It goes into the anterolateral aspect of the legs as well.  Is more significant on the right side.  Has got good relief with injections to L2 and L3 in the past.   Previous treatments: L2-3 steroid injection, ibuprofen, activity modification, cane use    Physical Exam:   General: no acute distress, appears stated age Neurologic: alert, answering questions appropriately, following commands Respiratory: unlabored breathing on room air, symmetric chest rise Psychiatric:  appropriate affect, normal cadence to speech     MSK (spine):   -Strength exam                                                   Left                  Right   EHL                              4/5                  4/5 TA                                 5/5                  5/5 GSC                             5/5                  5/5 Knee extension            5/5                  5/5 Hip flexion  4+/5                4+/5   -Sensory exam                           Sensation intact to light touch in L3-S1 nerve distributions of bilateral lower extremities             Sensation intact to light touch in C5-T1 nerve distributions of bilateral upper extremities   -Achilles DTR: 1/4 on the left, 1/4 on the right -Patellar tendon DTR: 1/4 on the left, 1/4 on the right   -Negative straight leg raise bilaterally   Left hip exam: No pain through range of motion, negative Stinchfield, negative FABER Right hip exam: No pain through range of motion, negative Stinchfield, negative FABER    Imaging: X-ray of the lumbar spine from 05/19/2022 was previously independently reviewed and interpreted, showing transitional anatomy but for labeling purposes the lowest instrumented level will be S1.  There is a L4-S1 instrumented fusion with interbody's at L4/5 and L5/S1.  There is no bridging bone seen at the cranial aspect of the L5/S1 interbody device.  No lucency seen around the screws and no backing out of the screw seen.  Disc height loss at L2/3 and L3/4 -vacuum disc phenomenon at those levels as well.  Laminectomy defects from L2-S1.  No fracture or dislocation seen.   MRI of the lumbar spine from 09/22/2021 was previously independently reviewed and interpreted, showing transitional anatomy.  For labeling purposes, the lowest instrumented level is S1.  DDD at L2/3 and L3/4.  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  The foraminal stenosis is more significant at L3/4.  Lateral recess  stenosis at L2-3.  Laminectomy defects from L2-S1.    Patient name: Julia Knight Patient MRN: [id number] Date of visit: 10/13/22

## 2022-10-13 NOTE — Progress Notes (Signed)
Uneventful anesthetic. Report to pacu rn. Vss. Care resumed by rn. 

## 2022-10-14 ENCOUNTER — Telehealth: Payer: Self-pay | Admitting: *Deleted

## 2022-10-14 ENCOUNTER — Telehealth: Payer: Self-pay

## 2022-10-14 DIAGNOSIS — R1013 Epigastric pain: Secondary | ICD-10-CM

## 2022-10-14 NOTE — Telephone Encounter (Signed)
I have spoken to [facility] in radiology scheduling. The first availability for "urgent" right now is 10/22/22 at 8 am. Dr Leonides Schanz has been made aware and verbalizes understanding. Patient is scheduled for 10/22/22 at 8 am, [facility]. She will need to check in at the emergency room. Patient should remain NPO 6 hours prior to appointment and should not have any opiate based medication. Patient will be given pre-service center phone number [phone]) to contact if she needs to look at cost of imaging (she is self pay).   Patient's daughter, Victorio Palm (who interprets for her mother) verbalizes understanding of this information and is in agreement with the plan.

## 2022-10-14 NOTE — Telephone Encounter (Signed)
Follow up call to pt, no answer.  

## 2022-10-14 NOTE — Telephone Encounter (Signed)
-----   Message from Imogene Burn, MD sent at 10/13/2022  3:32 PM EDT ----- Elio Forget, let's get her set up for an urgent HIDA scan for epigastric ab pain. Thanks.

## 2022-10-18 ENCOUNTER — Encounter: Payer: Self-pay | Admitting: Internal Medicine

## 2022-10-19 NOTE — Telephone Encounter (Signed)
Julia Knight from scheduling called to state that unfortunately, patient's 10/22/22 HIDA scan must be rescheduled due to hospital conflict. Patient is rescheduled to Monday, 10/24/22 at 11:00 am, 10:30 am arrival at [facility] Radiology. She will now need to go directly to radiology for registration rather than the ER.   I have left a voicemail for [place] (daughter) to call us back as soon as possible to discuss.

## 2022-10-19 NOTE — Telephone Encounter (Signed)
I have spoken to [place] (daughter) to advise of change in appointment time and date for HIDA scan. She verbalizes understanding and states that this change is fine.

## 2022-10-22 ENCOUNTER — Other Ambulatory Visit (HOSPITAL_COMMUNITY): Payer: Self-pay

## 2022-10-24 ENCOUNTER — Ambulatory Visit (HOSPITAL_COMMUNITY)
Admission: RE | Admit: 2022-10-24 | Discharge: 2022-10-24 | Disposition: A | Discharge status: Home / Self Care | Payer: Self-pay | Source: Ambulatory Visit | Attending: Internal Medicine | Admitting: Internal Medicine

## 2022-10-24 DIAGNOSIS — R1013 Epigastric pain: Secondary | ICD-10-CM | POA: Insufficient documentation

## 2022-10-27 ENCOUNTER — Ambulatory Visit (HOSPITAL_COMMUNITY)
Admission: RE | Admit: 2022-10-27 | Discharge: 2022-10-27 | Disposition: A | Discharge status: Home / Self Care | Payer: Self-pay | Source: Ambulatory Visit | Attending: Internal Medicine | Admitting: Internal Medicine

## 2022-10-27 DIAGNOSIS — R1013 Epigastric pain: Secondary | ICD-10-CM | POA: Insufficient documentation

## 2022-10-27 MED ORDER — TECHNETIUM TC 99M MEBROFENIN IV KIT
5.2000 | PACK | Freq: Once | INTRAVENOUS | Status: AC | PRN
  Administered 2022-10-27: 5.2 via INTRAVENOUS

## 2022-11-03 ENCOUNTER — Ambulatory Visit (AMBULATORY_SURGERY_CENTER): Payer: Self-pay

## 2022-11-03 ENCOUNTER — Other Ambulatory Visit (HOSPITAL_BASED_OUTPATIENT_CLINIC_OR_DEPARTMENT_OTHER): Payer: Self-pay

## 2022-11-03 VITALS — Ht 61.0 in | Wt 177.2 lb

## 2022-11-03 DIAGNOSIS — E1165 Type 2 diabetes mellitus with hyperglycemia: Secondary | ICD-10-CM

## 2022-11-03 DIAGNOSIS — D649 Anemia, unspecified: Secondary | ICD-10-CM

## 2022-11-03 DIAGNOSIS — Z1211 Encounter for screening for malignant neoplasm of colon: Secondary | ICD-10-CM

## 2022-11-03 DIAGNOSIS — Z0000 Encounter for general adult medical examination without abnormal findings: Secondary | ICD-10-CM

## 2022-11-03 LAB — LIPID PANEL
Chol/HDL Ratio: 3.3 ratio (ref 0.0–4.4)
Cholesterol, Total: 166 mg/dL (ref 100–199)
HDL: 50 mg/dL (ref >39)
LDL Chol Calc (NIH): 86 mg/dL (ref 0–99)
Triglycerides: 174 mg/dL — ABNORMAL HIGH (ref 0–149)
VLDL Cholesterol Cal: 30 mg/dL (ref 5–40)

## 2022-11-03 LAB — CBC WITH DIFFERENTIAL/PLATELET
Basophils Absolute: 0.1 10*3/uL (ref 0.0–0.2)
Basos: 1 %
EOS (ABSOLUTE): 0.6 10*3/uL — ABNORMAL HIGH (ref 0.0–0.4)
Eos: 5 %
Hematocrit: 34.5 % (ref 34.0–46.6)
Hemoglobin: 11.3 g/dL (ref 11.1–15.9)
Immature Grans (Abs): 0 10*3/uL (ref 0.0–0.1)
Immature Granulocytes: 0 %
Lymphocytes Absolute: 3.3 10*3/uL — ABNORMAL HIGH (ref 0.7–3.1)
Lymphs: 31 %
MCH: 27.8 pg (ref 26.6–33.0)
MCHC: 32.8 g/dL (ref 31.5–35.7)
MCV: 85 fL (ref 79–97)
Monocytes Absolute: 0.8 10*3/uL (ref 0.1–0.9)
Monocytes: 7 %
Neutrophils Absolute: 6 10*3/uL (ref 1.4–7.0)
Neutrophils: 56 %
Platelets: 308 10*3/uL (ref 150–450)
RBC: 4.07 x10E6/uL (ref 3.77–5.28)
RDW: 13 % (ref 11.7–15.4)
WBC: 10.9 10*3/uL — ABNORMAL HIGH (ref 3.4–10.8)

## 2022-11-03 LAB — HEMOGLOBIN A1C
Est. average glucose Bld gHb Est-mCnc: 186 mg/dL
Hgb A1c MFr Bld: 8.1 % — ABNORMAL HIGH (ref 4.8–5.6)

## 2022-11-03 LAB — COMPREHENSIVE METABOLIC PANEL
ALT: 16 IU/L (ref 0–32)
AST: 13 IU/L (ref 0–40)
Albumin: 4.6 g/dL (ref 3.9–4.9)
Alkaline Phosphatase: 219 IU/L — ABNORMAL HIGH (ref 44–121)
BUN/Creatinine Ratio: 17 (ref 12–28)
BUN: 18 mg/dL (ref 8–27)
Bilirubin Total: <0.2 mg/dL (ref 0.0–1.2)
CO2: 18 mmol/L — ABNORMAL LOW (ref 20–29)
Calcium: 9.3 mg/dL (ref 8.7–10.3)
Chloride: 106 mmol/L (ref 96–106)
Creatinine, Ser: 1.07 mg/dL — ABNORMAL HIGH (ref 0.57–1.00)
Globulin, Total: 2.7 g/dL (ref 1.5–4.5)
Glucose: 154 mg/dL — ABNORMAL HIGH (ref 70–99)
Potassium: 5.9 mmol/L — ABNORMAL HIGH (ref 3.5–5.2)
Sodium: 137 mmol/L (ref 134–144)
Total Protein: 7.3 g/dL (ref 6.0–8.5)
eGFR: 56 mL/min/{1.73_m2} — ABNORMAL LOW (ref >59)

## 2022-11-03 LAB — TSH RFX ON ABNORMAL TO FREE T4: TSH: 1.66 u[IU]/mL (ref 0.450–4.500)

## 2022-11-03 NOTE — Progress Notes (Signed)
No egg or soy allergy known to patient  No issues known to pt with past sedation with any surgeries or procedures Patient denies ever being told they had issues or difficulty with intubation  No FH of Malignant Hyperthermia Pt is not on diet pills Pt is not on  home 02  Pt is not on blood thinners  Pt reports constipation  No A fib or A flutter Have any cardiac testing pending--no LOA: independent Prep: Plenvu xtra miralax   Patient's chart reviewed by Cathlyn Parsons CNRA prior to previsit and patient appropriate for the [facility].  Previsit completed and red dot placed by patient's name on their procedure day (on provider's schedule).     PV competed with patient using spanish interpreter services. Prep instructions reviewed with patient and daughter. Plnevu sample given to patient at visit

## 2022-11-08 ENCOUNTER — Other Ambulatory Visit (HOSPITAL_BASED_OUTPATIENT_CLINIC_OR_DEPARTMENT_OTHER): Payer: Self-pay | Admitting: Family Medicine

## 2022-11-08 DIAGNOSIS — E875 Hyperkalemia: Secondary | ICD-10-CM

## 2022-11-11 ENCOUNTER — Ambulatory Visit: Payer: Self-pay | Admitting: Orthopaedic Surgery

## 2022-11-15 ENCOUNTER — Ambulatory Visit (AMBULATORY_SURGERY_CENTER): Payer: Self-pay | Admitting: Internal Medicine

## 2022-11-15 ENCOUNTER — Encounter: Payer: Self-pay | Admitting: Internal Medicine

## 2022-11-15 VITALS — BP 124/68 | HR 63 | Temp 98.4°F | Resp 17 | Ht 61.0 in | Wt 177.2 lb

## 2022-11-15 DIAGNOSIS — D122 Benign neoplasm of ascending colon: Secondary | ICD-10-CM

## 2022-11-15 DIAGNOSIS — Z1211 Encounter for screening for malignant neoplasm of colon: Secondary | ICD-10-CM

## 2022-11-15 MED ORDER — SODIUM CHLORIDE 0.9 % IV SOLN
500.0000 mL | Freq: Once | INTRAVENOUS | Status: DC
Start: 2022-11-15 — End: 2022-11-15

## 2022-11-15 NOTE — Progress Notes (Signed)
Called to room to assist during endoscopic procedure.  Patient ID and intended procedure confirmed with present staff. Received instructions for my participation in the procedure from the performing physician.  

## 2022-11-15 NOTE — Progress Notes (Signed)
Pt's states no medical or surgical changes since previsit or office visit. 

## 2022-11-15 NOTE — Op Note (Signed)
[facility] Patient Name: Julia Knight Procedure Date: 11/15/2022 1:58 PM MRN: [id number] Endoscopist: Madelyn Brunner [place] , , [id number] Age: 72 Referring MD:  Date of Birth: 1951-02-14 Gender: Female Account #: [id number] Procedure:                Colonoscopy Indications:              Screening for colorectal malignant neoplasm Medicines:                Monitored Anesthesia Care Procedure:                Pre-Anesthesia Assessment:                           - Prior to the procedure, a History and Physical                            was performed, and patient medications and                            allergies were reviewed. The patient's tolerance of                            previous anesthesia was also reviewed. The risks                            and benefits of the procedure and the sedation                            options and risks were discussed with the patient.                            All questions were answered, and informed consent                            was obtained. Prior Anticoagulants: The patient has                            taken no anticoagulant or antiplatelet agents. ASA                            Grade Assessment: II - A patient with mild systemic                            disease. After reviewing the risks and benefits,                            the patient was deemed in satisfactory condition to                            undergo the procedure.                           After obtaining informed consent, the colonoscope  was passed under direct vision. Throughout the                            procedure, the patient's blood pressure, pulse, and                            oxygen saturations were monitored continuously. The                            Olympus Scope SN: [id number] was introduced through                            the anus and advanced to the the terminal ileum.                             The colonoscopy was performed without difficulty.                            The patient tolerated the procedure well. The                            quality of the bowel preparation was good. The                            terminal ileum, ileocecal valve, appendiceal                            orifice, and rectum were photographed. Scope In: 2:12:23 PM Scope Out: 2:34:34 PM Scope Withdrawal Time: 0 hours 17 minutes 55 seconds  Total Procedure Duration: 0 hours 22 minutes 11 seconds  Findings:                 The terminal ileum appeared normal.                           A 12 mm polyp was found in the ascending colon. The                            polyp was sessile. The polyp was removed with a                            cold snare. Resection and retrieval were complete.                           Non-bleeding internal hemorrhoids were found during                            retroflexion. Complications:            No immediate complications. Estimated Blood Loss:     Estimated blood loss was minimal. Impression:               - The examined portion of the ileum was normal.                           -  One 12 mm polyp in the ascending colon, removed                            with a cold snare. Resected and retrieved.                           - Non-bleeding internal hemorrhoids. Recommendation:           - Discharge patient to home (with escort).                           - Await pathology results.                           - The findings and recommendations were discussed                            with the patient. Dr Particia Lather "[address] [place],  11/15/2022 2:36:32 PM

## 2022-11-15 NOTE — Progress Notes (Signed)
GASTROENTEROLOGY PROCEDURE H&P NOTE   Primary Care Physician: [place], Buren Kos, MD    Reason for Procedure:   Colon cancer screening  Plan:    Colonoscopy  Patient is appropriate for endoscopic procedure(s) in the ambulatory ([facility]) setting.  The nature of the procedure, as well as the risks, benefits, and alternatives were carefully and thoroughly reviewed with the patient. Ample time for discussion and questions allowed. The patient understood, was satisfied, and agreed to proceed.     HPI: Julia Knight is a 72 y.o. female who presents for colonoscopy for evaluation of colon cancer screening .  Patient was most recently seen in the Gastroenterology Clinic on 09/20/22.  No interval change in medical history since that appointment. Please refer to that note for full details regarding GI history and clinical presentation.   Past Medical History:  Diagnosis Date   Acute blood loss anemia 09/10/2020   Allergic rhinitis 08/24/2011   Allergy    Anxiety    Arthritis    back and hands   Constipation due to opioid therapy 09/18/2020   COVID-19 11/16/2018   Depression    Diabetes mellitus without complication (HCC)    type 2   Dizziness 08/16/2017   Encounter for medical examination to establish care 07/24/2020   Gastroenteritis, infectious, presumed 08/16/2017   GERD (gastroesophageal reflux disease)    Glossitis 09/10/2020   Hyperlipemia    Hypertension    Neuromuscular disorder (HCC)     Past Surgical History:  Procedure Laterality Date   APPENDECTOMY     CHOLECYSTECTOMY     TONSILLECTOMY     removed as an adult   UPPER GASTROINTESTINAL ENDOSCOPY  10/13/2022    Prior to Admission medications   Medication Sig Start Date End Date Taking? Authorizing Provider  atorvastatin (LIPITOR) 40 MG tablet Take 1 tablet (40 mg total) by mouth at bedtime. 01/25/22  Yes Early, Sung Amabile, NP  calcium carbonate (OSCAL) 1500 (600 Ca) MG TABS tablet Take 600 mg of elemental calcium  by mouth in the morning.   Yes [provider]  glipiZIDE (GLUCOTROL) 5 MG tablet Take 1 tablet (5 mg total) by mouth 2 (two) times daily before a meal. 10/13/22  Yes de [place], Buren Kos, MD  IBUPROFEN PO Take by mouth as needed (cramps).   Yes [provider]  lisinopril (ZESTRIL) 20 MG tablet Take 1 tablet (20 mg total) by mouth daily. 01/25/22  Yes Early, Sung Amabile, NP  metFORMIN (GLUCOPHAGE) 1000 MG tablet Take 1 tablet (1,000 mg total) by mouth 2 (two) times daily (in the morning and evening with a meal). 10/13/22  Yes de [place], Raymond J, MD  omeprazole (PRILOSEC) 40 MG capsule Take 1 capsule (40 mg total) by mouth in the morning and at bedtime. 10/13/22  Yes Imogene Burn, MD  Iron, Ferrous Sulfate, 325 (65 Fe) MG TABS Take 1 tablet by mouth every Monday, Wednesday, and Friday. 10/14/22   de [place], Buren Kos, MD  methocarbamol (ROBAXIN) 500 MG tablet Take 1 tablet (500 mg total) by mouth every 6 (six) hours as needed (pain, muscle spasms/cramps). 10/13/22   London Sheer, MD  Multiple Vitamin (MULTIVITAMIN WITH MINERALS) TABS tablet Take 1 tablet by mouth in the morning.    [provider]  omega-3 acid ethyl esters (LOVAZA) 1 g capsule Take 1 g by mouth daily. Patient not taking: Reported on 11/03/2022    [provider]    Current Outpatient Medications  Medication  Sig Dispense Refill   atorvastatin (LIPITOR) 40 MG tablet Take 1 tablet (40 mg total) by mouth at bedtime. 90 tablet 3   calcium carbonate (OSCAL) 1500 (600 Ca) MG TABS tablet Take 600 mg of elemental calcium by mouth in the morning.     glipiZIDE (GLUCOTROL) 5 MG tablet Take 1 tablet (5 mg total) by mouth 2 (two) times daily before a meal. 180 tablet 1   IBUPROFEN PO Take by mouth as needed (cramps).     lisinopril (ZESTRIL) 20 MG tablet Take 1 tablet (20 mg total) by mouth daily. 90 tablet 3   metFORMIN (GLUCOPHAGE) 1000 MG tablet Take 1 tablet (1,000 mg total) by mouth 2 (two) times daily (in the  morning and evening with a meal). 180 tablet 3   omeprazole (PRILOSEC) 40 MG capsule Take 1 capsule (40 mg total) by mouth in the morning and at bedtime. 120 capsule 1   Iron, Ferrous Sulfate, 325 (65 Fe) MG TABS Take 1 tablet by mouth every Monday, Wednesday, and Friday. 30 tablet 0   methocarbamol (ROBAXIN) 500 MG tablet Take 1 tablet (500 mg total) by mouth every 6 (six) hours as needed (pain, muscle spasms/cramps). 50 tablet 0   Multiple Vitamin (MULTIVITAMIN WITH MINERALS) TABS tablet Take 1 tablet by mouth in the morning.     omega-3 acid ethyl esters (LOVAZA) 1 g capsule Take 1 g by mouth daily. (Patient not taking: Reported on 11/03/2022)     Current Facility-Administered Medications  Medication Dose Route Frequency Provider Last Rate Last Admin   0.9 %  sodium chloride infusion  500 mL Intravenous Once Imogene Burn, MD        Allergies as of 11/15/2022   (No Known Allergies)    Family History  Problem Relation Age of Onset   Diabetes Brother    Colon cancer Neg Hx    Esophageal cancer Neg Hx    Rectal cancer Neg Hx    Stomach cancer Neg Hx     Social History   Socioeconomic History   Marital status: Widowed    Spouse name: Not on file   Number of children: Not on file   Years of education: Not on file   Highest education level: Not on file  Occupational History   Not on file  Tobacco Use   Smoking status: Former    Types: Cigarettes   Smokeless tobacco: Never  Vaping Use   Vaping Use: Never used  Substance and Sexual Activity   Alcohol use: Never   Drug use: Never   Sexual activity: Not Currently  Other Topics Concern   Not on file  Social History Narrative   ** Merged History Encounter **       Social Determinants of Health   Financial Resource Strain: Not on file  Food Insecurity: Not on file  Transportation Needs: Not on file  Physical Activity: Not on file  Stress: Not on file  Social Connections: Not on file  Intimate Partner Violence: Not on  file    Physical Exam: Vital signs in last 24 hours: BP (!) 148/68   Pulse 90   Temp 98.4 F (36.9 C) (Temporal)   Ht 5\' 1"  (1.549 m)   Wt 177 lb 3.2 oz (80.4 kg)   SpO2 95%   BMI 33.48 kg/m  GEN: NAD EYE: Sclerae anicteric ENT: MMM CV: Non-tachycardic Pulm: No increased WOB GI: Soft NEURO:  Alert & Oriented   Eulah Pont, MD [facility]  11/15/2022 1:56 PM

## 2022-11-15 NOTE — Patient Instructions (Addendum)
Resume all of your previous medications.  USTED TUVO UN PROCEDIMIENTO ENDOSCPICO HOY EN EL [facility]:   Lea el informe del procedimiento que se le entreg para cualquier pregunta especfica sobre lo que se encontr durante su examen.  Si el informe del examen no responde a sus preguntas, por favor llame a su gastroenterlogo para aclararlo.  Si usted solicit que no se le den detalles de lo que se encontr en su procedimiento al acompaante que le va a cuidar, entonces el informe del procedimiento se ha incluido en un sobre sellado para que usted lo revise despus cuando le sea ms conveniente.   LO QUE PUEDE ESPERAR: Algunas sensaciones de hinchazn en el abdomen.  Puede tener ms gases de lo normal.  El caminar puede ayudarle a eliminar el aire que se le puso en el tracto gastrointestinal durante el procedimiento y reducir la hinchazn.  Si le hicieron una endoscopia inferior (como una colonoscopia o una sigmoidoscopia flexible), podra notar manchas de sangre en las heces fecales o en el papel higinico.  Si se someti a una preparacin intestinal para su procedimiento, es posible que no tenga una evacuacin intestinal normal durante algunos das.   Tenga en cuenta:  Es posible que note un poco de irritacin y congestin en la nariz o algn drenaje.  Esto es debido al oxgeno utilizado durante su procedimiento.  No hay que preocuparse y esto debe desaparecer ms o menos en un da.   SNTOMAS PARA REPORTAR INMEDIATAMENTE:  Despus de una endoscopia inferior (colonoscopia o sigmoidoscopia flexible):  Cantidades excesivas de sangre en las heces fecales  Sensibilidad significativa o empeoramiento de los dolores abdominales   Hinchazn aguda del abdomen que antes no tena   Fiebre de 100F o ms     Para asuntos urgentes o de emergencia, puede comunicarse con un gastroenterlogo a cualquier hora llamando al [phone].  DIETA:  Recomendamos una comida pequea al principio, pero  luego puede continuar con su dieta normal.  Tome muchos lquidos, pero debe evitar las bebidas alcohlicas durante 24 horas.    ACTIVIDAD:  Debe planear tomarse las cosas con calma por el resto del da y no debe CONDUCIR ni usar maquinaria pesada hasta maana (debido a los medicamentos de sedacin utilizados durante el examen).     SEGUIMIENTO: Nuestro personal llamar al nmero que aparece en su historial al siguiente da hbil de su procedimiento para ver cmo se siente y para responder cualquier pregunta o inquietud que pueda tener con respecto a la informacin que se le dio despus del procedimiento. Si no podemos contactarle, le dejaremos un mensaje.  Sin embargo, si se siente bien y no tiene ningn problema, no es necesario que nos devuelva la llamada.  Asumiremos que ha regresado a sus actividades diarias normales sin incidentes. Si se le tomaron algunas biopsias, le contactaremos por telfono o por carta en las prximas 3 semanas.  Si no ha sabido nada sobre las biopsias en el transcurso de 3 semanas, por favor llmenos al [phone].   FIRMAS/CONFIDENCIALIDAD: Usted y/o el acompaante que le cuide han firmado documentos que se ingresarn en su historial mdico electrnico.  Estas firmas atestiguan el hecho de que la informacin anterior  

## 2022-11-15 NOTE — Progress Notes (Signed)
A/ox3, pleased with MAC, report to RN 

## 2022-11-16 ENCOUNTER — Telehealth: Payer: Self-pay

## 2022-11-16 NOTE — Telephone Encounter (Signed)
  Follow up Call-     11/15/2022    1:13 PM 10/13/2022    2:49 PM  Call back number  Post procedure Call Back phone  # [phone]- Joellen Jersey [phone]  Permission to leave phone message Yes Yes     Patient questions:  Do you have a fever, pain , or abdominal swelling? No. Pain Score  0 *  Have you tolerated food without any problems? Yes.    Have you been able to return to your normal activities? Yes.    Do you have any questions about your discharge instructions: Diet   No. Medications  No. Follow up visit  No.  Do you have questions or concerns about your Care? No.  Actions: * If pain score is 4 or above: No action needed, pain <4.

## 2022-11-18 ENCOUNTER — Telehealth: Payer: Self-pay | Admitting: Internal Medicine

## 2022-11-18 ENCOUNTER — Other Ambulatory Visit: Payer: Self-pay

## 2022-11-18 ENCOUNTER — Telehealth: Payer: Self-pay

## 2022-11-18 DIAGNOSIS — C189 Malignant neoplasm of colon, unspecified: Secondary | ICD-10-CM

## 2022-11-18 NOTE — Telephone Encounter (Signed)
Records faxed to [facility] . Referral placed for oncology at [facility]. Orders for CT chest/abd/pelvis entered and scheduling notified.

## 2022-11-18 NOTE — Telephone Encounter (Signed)
Referrals placed to [facility]. Also to Oncology Hematology. Patient may be medicaid eligible. Radiology scheduling notified of the need for scheduling of her CT.

## 2022-11-18 NOTE — Telephone Encounter (Signed)
Spoke to the patient with the help of a Spanish phone interpreter. 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 Told the patient that I would be referring her to colorectal surgery for further treatment and will plan for a CT C/A/P w/contrast for staging purposes.  Beth, please place an urgent referral to colorectal surgery (I'm not sure what the best option for her would be since she is self pay). Please place an order for CT C/A/P with contrast for staging purposes.

## 2022-11-24 ENCOUNTER — Other Ambulatory Visit: Payer: Self-pay

## 2022-11-24 ENCOUNTER — Ambulatory Visit (INDEPENDENT_AMBULATORY_CARE_PROVIDER_SITE_OTHER): Payer: Self-pay | Admitting: Orthopedic Surgery

## 2022-11-24 ENCOUNTER — Other Ambulatory Visit (INDEPENDENT_AMBULATORY_CARE_PROVIDER_SITE_OTHER): Payer: Self-pay

## 2022-11-24 DIAGNOSIS — Z9889 Other specified postprocedural states: Secondary | ICD-10-CM

## 2022-11-24 DIAGNOSIS — M5416 Radiculopathy, lumbar region: Secondary | ICD-10-CM

## 2022-11-24 MED ORDER — CYCLOBENZAPRINE HCL 10 MG PO TABS
10.0000 mg | ORAL_TABLET | Freq: Three times a day (TID) | ORAL | 1 refills | Status: DC | PRN
  Filled 2022-11-24: qty 50, 17d supply, fill #0

## 2022-11-24 MED ORDER — ONDANSETRON HCL 4 MG PO TABS
4.0000 mg | ORAL_TABLET | Freq: Three times a day (TID) | ORAL | 0 refills | Status: AC | PRN
  Filled 2022-11-24: qty 20, 7d supply, fill #0

## 2022-11-24 NOTE — Progress Notes (Signed)
Orthopedic Spine Surgery Office Note   Assessment: Patient is a 72 y.o. female with low back pain that radiates into her bilateral lower extremities. 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  PI-LL mismatch of 15     Plan: -Patient remains interested in surgery.  However, she is not a good candidate with an A1c of 8.1. If she gets her A1c down to 7.5 or less, then should be a surgical candidate. Discussed indirect decompression with L2/3 & L3/4 XLIF and extension of her posterior fusion to L2.  Importantly, though, she was recently diagnosed with colon cancer so I told her that she should focus on that and do what needs to get done for that first -Ordered EMG/NCS of the lower extremities to evaluate for neuropathy as she describes some symptoms of this and has had poorly controlled diabetes for a while -Prescribed Flexeril and Zofran for her muscle spasms and nausea, respectively -Return to office in 8 weeks, x-rays at next visit: None     Patient expressed understanding of the plan and all questions were answered to the patient's satisfaction.    __________________________________________________________________________   History: Patient is a 72 y.o. female who has been previously seen in the office for symptoms of lumbar radiculopathy.  Several months ago, I had sent her to [facility] for concern of myelopathy with stenosis up into the upper cervical spine. They did not feel that her symptoms and imaging was consistent with myelopathy.  Since that time, we have been talking more about her lumbar spine.  She is still having pain in her low back that radiates into the bilateral lower extremities.  She feels it along the lateral and anterior aspect of the thighs going into the anterolateral aspect of the legs.  She states it radiates into the feet on both the dorsal and plantar aspects of the feet.  She says that the pain is gotten to the point that it limits her ability to walk.   She does get some relief if she sits  She also describes numbness, paresthesias, and pain starting in her toes and going up to her knees in multiple distributions.  Of note, she was recently diagnosed with colon cancer.  She was told this news on Friday and has not heard what treatments will be involved for this diagnosis.   Previous treatments: L2-3 steroid injection, ibuprofen, activity modification, cane use, Robaxin    Physical Exam:   General: no acute distress, appears stated age Neurologic: alert, answering questions appropriately, following commands Respiratory: unlabored breathing on room air, symmetric chest rise Psychiatric: appropriate affect, normal cadence to speech     MSK (spine):   -Strength exam                                                   Left                  Right   EHL                              4/5                  4/5 TA  5/5                  5/5 GSC                             5/5                  5/5 Knee extension            5/5                  5/5 Hip flexion                    4+/5                4+/5   -Sensory exam                           Sensation intact to light touch in L3-S1 nerve distributions of bilateral lower extremities (decreased distal to the knees bilaterally)             Sensation intact to light touch in C5-T1 nerve distributions of bilateral upper extremities   -Achilles DTR: 1/4 on the left, 1/4 on the right -Patellar tendon DTR: 1/4 on the left, 1/4 on the right   -Negative straight leg raise bilaterally   Left hip exam: No pain through range of motion, negative Stinchfield, negative FABER Right hip exam: No pain through range of motion, negative Stinchfield, negative FABER     Imaging: X-ray of the lumbar spine from 11/24/2022 was independently reviewed and interpreted, showing posterior instrumentation from L4-S1.  Screws not backed out.  There is no lucency around the screws.  There  are interbody devices at L4/5 and L5/S1 that appear in appropriate position.  Disc height loss is seen at L1/2, L2/3, L3/4.  No evidence of instability on flexion/extension views.  Vacuum disc phenomenon at L2/3 and L3/4.  No fracture or dislocation seen.  PI of 43, LL of 28   MRI of the lumbar spine from 09/22/2021 was previously independently reviewed and interpreted, showing transitional anatomy.  For labeling purposes, the lowest instrumented level is S1.  DDD at L2/3 and L3/4.  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  The foraminal stenosis is more significant at L3/4.  Lateral recess stenosis at L2-3.  Laminectomy defects from L2-S1.     Patient name: Julia Knight Patient MRN: [id number] Date of visit: 11/24/22

## 2022-11-25 NOTE — Telephone Encounter (Signed)
Patients daughter called to ask if the patient can be referred to a place where the [facility] financial assistance program is accepted.

## 2022-11-28 NOTE — Telephone Encounter (Signed)
Patient's daughter returned your call.  

## 2022-11-28 NOTE — Telephone Encounter (Signed)
PT called with son translating. Returning call. Please advise.

## 2022-11-28 NOTE — Telephone Encounter (Addendum)
Called the daughter. No answer. Left more detail on her voicemail explaining the referral. ) she needs to see a Careers adviser. There is not a place that will take the [facility] financial assistance program that is surgical. The [facility] in [facility] does. )She needs to get the CT done asap. This will be needed in deciding the best course of treatment for her.

## 2022-11-28 NOTE — Telephone Encounter (Addendum)
Called to speak with daughter. No answer. Left a message asking for a call back to discuss. Needs to schedule CT of the chest, abd, pelvis. Ordered 11/18/22.  Referral to [facility] faxed on 11/21/22. They have their own financial assistance department that she will need to talk with. Patient needs surgical consult. [facility] Card) does not have any surgeons to refer to.

## 2022-11-28 NOTE — Telephone Encounter (Signed)
Spoke with patients daughter she was advised that the surgeons are non par with the orange card she has within [facility], I advised her she would be able to call the surgeon's office at [facility] and get more information on how to proceed with their financial assistants program. Patients daughter understood. I also provided her with the phone number to get the Ct scan scheduled as soon as possible.

## 2022-12-06 NOTE — Telephone Encounter (Signed)
Patient was scheduled for 12/08/22 @ 9 am with Dr Byrd Hesselbach at [facility].

## 2022-12-07 ENCOUNTER — Ambulatory Visit (HOSPITAL_COMMUNITY)
Admission: RE | Admit: 2022-12-07 | Discharge: 2022-12-07 | Disposition: A | Discharge status: Home / Self Care | Payer: Self-pay | Source: Ambulatory Visit | Attending: Internal Medicine | Admitting: Internal Medicine

## 2022-12-07 DIAGNOSIS — C189 Malignant neoplasm of colon, unspecified: Secondary | ICD-10-CM | POA: Insufficient documentation

## 2022-12-07 MED ORDER — SODIUM CHLORIDE (PF) 0.9 % IJ SOLN
INTRAMUSCULAR | Status: AC
  Filled 2022-12-07: qty 50

## 2022-12-07 MED ORDER — IOHEXOL 300 MG/ML  SOLN
100.0000 mL | Freq: Once | INTRAMUSCULAR | Status: AC | PRN
  Administered 2022-12-07: 100 mL via INTRAVENOUS

## 2022-12-08 DIAGNOSIS — C189 Malignant neoplasm of colon, unspecified: Secondary | ICD-10-CM | POA: Insufficient documentation

## 2022-12-08 HISTORY — PX: COLON SURGERY: SHX602

## 2022-12-13 ENCOUNTER — Ambulatory Visit (INDEPENDENT_AMBULATORY_CARE_PROVIDER_SITE_OTHER): Payer: Self-pay | Admitting: Family Medicine

## 2022-12-13 ENCOUNTER — Other Ambulatory Visit: Payer: Self-pay

## 2022-12-13 VITALS — BP 142/56 | HR 97 | Temp 97.6°F | Ht 61.0 in | Wt 178.9 lb

## 2022-12-13 DIAGNOSIS — Z7984 Long term (current) use of oral hypoglycemic drugs: Secondary | ICD-10-CM

## 2022-12-13 DIAGNOSIS — R911 Solitary pulmonary nodule: Secondary | ICD-10-CM | POA: Insufficient documentation

## 2022-12-13 DIAGNOSIS — E1165 Type 2 diabetes mellitus with hyperglycemia: Secondary | ICD-10-CM

## 2022-12-13 DIAGNOSIS — E782 Mixed hyperlipidemia: Secondary | ICD-10-CM

## 2022-12-13 DIAGNOSIS — I10 Essential (primary) hypertension: Secondary | ICD-10-CM

## 2022-12-13 MED ORDER — METFORMIN HCL 1000 MG PO TABS
1000.0000 mg | ORAL_TABLET | Freq: Two times a day (BID) | ORAL | 3 refills | Status: DC
Start: 2022-12-13 — End: 2023-01-30
  Filled 2022-12-13: qty 180, 90d supply, fill #0

## 2022-12-13 MED ORDER — OMEPRAZOLE 40 MG PO CPDR
40.0000 mg | DELAYED_RELEASE_CAPSULE | Freq: Two times a day (BID) | ORAL | 1 refills | Status: DC
  Filled 2022-12-13: qty 120, 60d supply, fill #0
  Filled 2023-03-31: qty 120, 60d supply, fill #1

## 2022-12-13 MED ORDER — LISINOPRIL 20 MG PO TABS
20.0000 mg | ORAL_TABLET | Freq: Every day | ORAL | 3 refills | Status: DC
  Filled 2022-12-13: qty 90, 90d supply, fill #0

## 2022-12-13 MED ORDER — GLIPIZIDE 5 MG PO TABS
5.0000 mg | ORAL_TABLET | Freq: Two times a day (BID) | ORAL | 1 refills | Status: DC
Start: 2022-12-13 — End: 2023-07-27
  Filled 2022-12-13 – 2023-03-31 (×2): qty 180, 90d supply, fill #0

## 2022-12-13 MED ORDER — ATORVASTATIN CALCIUM 40 MG PO TABS
40.0000 mg | ORAL_TABLET | Freq: Every day | ORAL | 3 refills | Status: DC
Start: 2022-12-13 — End: 2023-07-27
  Filled 2022-12-13: qty 90, 90d supply, fill #0

## 2022-12-13 NOTE — Progress Notes (Signed)
Procedures performed today:    None.  Independent interpretation of notes and tests performed by another provider:   None.  Brief History, Exam, Impression, and Recommendations:    BP (!) 142/56 (BP Location: Right Arm, Patient Position: Sitting, Cuff Size: Normal)   Pulse 97   Temp 97.6 F (36.4 C)   Ht 5\' 1"  (1.549 m)   Wt 178 lb 14.4 oz (81.1 kg)   SpO2 98%   BMI 33.80 kg/m   Visit conducted with assistance of in person interpreter  Type 2 diabetes mellitus with hyperglycemia, without long-term current use of insulin (HCC) Assessment & Plan: Recent A1c improving at 7.6%. Currently taking metformin and glipizide as prescribed. Has been taking current medications for about 2 months now. Last A1c was 1 week ago. Provided contact info for Dr. Talmage Nap, referral placed last appt yet Complete foot exam at next appointment Given progress thus far with medications and that both medications have only been taken for about 2 months now, can continue with this regimen, no changes today.  Expect that A1c should continue to gradually improve and ultimately the less than 7.5% which is target for patient both medically and for desired spine surgery.  She does have upcoming colon surgery related to diagnosis of colon cancer.  This certainly takes priority over spine surgery, this patient plans to postpone spine surgery until after recovering from colon surgery.  Orders: -     Basic metabolic panel -     Microalbumin / creatinine urine ratio -     glipiZIDE; Take 1 tablet (5 mg total) by mouth 2 (two) times daily before a meal.  Dispense: 180 tablet; Refill: 1 -     metFORMIN HCl; Take 1 tablet (1,000 mg total) by mouth 2 (two) times daily (in the morning and evening with a meal).  Dispense: 180 tablet; Refill: 3  Primary hypertension Assessment & Plan: Blood pressure borderline in office, systolic slightly above goal, diastolic at goal.  No current issues with chest pain or headaches.   Patient continues with lisinopril as prescribed.  Requesting refill today. Blood pressure remains slightly elevated on recheck.  Recommend continuing with current medication regimen, focusing on lifestyle modifications, DASH diet.  Recommend intermittent monitoring of blood pressure at home  Orders: -     Basic metabolic panel  Pulmonary nodule Assessment & Plan: Observed on prior CT scans, most recently last week.  On CT scan, solid 4 mm subpleural nodule was observed in left lower lobe, appears stable and benign, but recommended short-term follow-up imaging.  Patient is currently asymptomatic in this regard. Plan for proceeding with CT scan in 3-6 months. Consider pulmonology referral.   Mixed hyperlipidemia Assessment & Plan: Patient continues with atorvastatin, denies any issues myalgias or other side effects related to medication.  Requesting refill today.  Prior lipid panel with adequate control. At this time, can continue atorvastatin, refill sent to pharmacy on file  Orders: -     Atorvastatin Calcium; Take 1 tablet (40 mg total) by mouth at bedtime.  Dispense: 90 tablet; Refill: 3  Other orders -     Lisinopril; Take 1 tablet (20 mg total) by mouth daily.  Dispense: 90 tablet; Refill: 3 -     Omeprazole; Take 1 capsule (40 mg total) by mouth in the morning and at bedtime.  Dispense: 120 capsule; Refill: 1  Patient recently did see gastroenterology and had colonoscopy performed with findings consistent with colon cancer.  Given this, she did  establish with surgeon with plan for colon resection.  This is arranged for later this month.  Did review prior gastroenterology and surgeon documentation related to this.  Return in about 2 months (around 02/12/2023) for diabetes, hypertension.  Spent 43 minutes on this patient encounter, including preparation, chart review, face-to-face counseling with patient and coordination of care, and documentation of  encounter   ___________________________________________  de [place], MD, ABFM, [facility] Sports Medicine [facility]

## 2022-12-13 NOTE — Assessment & Plan Note (Signed)
Observed on prior CT scans, most recently last week.  On CT scan, solid 4 mm subpleural nodule was observed in left lower lobe, appears stable and benign, but recommended short-term follow-up imaging.  Patient is currently asymptomatic in this regard. Plan for proceeding with CT scan in 3-6 months. Consider pulmonology referral.

## 2022-12-13 NOTE — Assessment & Plan Note (Signed)
Patient continues with atorvastatin, denies any issues myalgias or other side effects related to medication.  Requesting refill today.  Prior lipid panel with adequate control. At this time, can continue atorvastatin, refill sent to pharmacy on file

## 2022-12-13 NOTE — Assessment & Plan Note (Addendum)
Blood pressure borderline in office, systolic slightly above goal, diastolic at goal.  No current issues with chest pain or headaches.  Patient continues with lisinopril as prescribed.  Requesting refill today. Blood pressure remains slightly elevated on recheck.  Recommend continuing with current medication regimen, focusing on lifestyle modifications, DASH diet.  Recommend intermittent monitoring of blood pressure at home

## 2022-12-13 NOTE — Assessment & Plan Note (Addendum)
Recent A1c improving at 7.6%. Currently taking metformin and glipizide as prescribed. Has been taking current medications for about 2 months now. Last A1c was 1 week ago. Provided contact info for Dr. Talmage Nap, referral placed last appt yet Complete foot exam at next appointment Given progress thus far with medications and that both medications have only been taken for about 2 months now, can continue with this regimen, no changes today.  Expect that A1c should continue to gradually improve and ultimately the less than 7.5% which is target for patient both medically and for desired spine surgery.  She does have upcoming colon surgery related to diagnosis of colon cancer.  This certainly takes priority over spine surgery, this patient plans to postpone spine surgery until after recovering from colon surgery.

## 2022-12-19 ENCOUNTER — Other Ambulatory Visit: Payer: Self-pay

## 2022-12-20 ENCOUNTER — Other Ambulatory Visit: Payer: Self-pay

## 2023-01-25 ENCOUNTER — Ambulatory Visit: Payer: Self-pay | Admitting: Orthopedic Surgery

## 2023-01-30 ENCOUNTER — Other Ambulatory Visit (HOSPITAL_BASED_OUTPATIENT_CLINIC_OR_DEPARTMENT_OTHER): Payer: Self-pay

## 2023-01-30 ENCOUNTER — Other Ambulatory Visit (HOSPITAL_BASED_OUTPATIENT_CLINIC_OR_DEPARTMENT_OTHER): Payer: Self-pay | Admitting: *Deleted

## 2023-01-30 DIAGNOSIS — E1165 Type 2 diabetes mellitus with hyperglycemia: Secondary | ICD-10-CM

## 2023-01-30 MED ORDER — METFORMIN HCL 1000 MG PO TABS
1000.0000 mg | ORAL_TABLET | Freq: Two times a day (BID) | ORAL | 3 refills | Status: DC
Start: 2023-01-30 — End: 2023-06-21
  Filled 2023-01-30: qty 60, 30d supply, fill #0
  Filled 2023-05-29: qty 180, 90d supply, fill #0

## 2023-02-09 ENCOUNTER — Other Ambulatory Visit (HOSPITAL_BASED_OUTPATIENT_CLINIC_OR_DEPARTMENT_OTHER): Payer: Self-pay

## 2023-02-14 ENCOUNTER — Ambulatory Visit (HOSPITAL_BASED_OUTPATIENT_CLINIC_OR_DEPARTMENT_OTHER): Payer: Self-pay | Admitting: Family Medicine

## 2023-02-22 ENCOUNTER — Other Ambulatory Visit: Payer: Self-pay | Admitting: Family Medicine

## 2023-02-22 DIAGNOSIS — Z1231 Encounter for screening mammogram for malignant neoplasm of breast: Secondary | ICD-10-CM

## 2023-03-24 ENCOUNTER — Ambulatory Visit: Payer: Self-pay

## 2023-03-29 ENCOUNTER — Other Ambulatory Visit: Payer: Self-pay | Admitting: Obstetrics and Gynecology

## 2023-03-29 DIAGNOSIS — Z1231 Encounter for screening mammogram for malignant neoplasm of breast: Secondary | ICD-10-CM

## 2023-03-30 ENCOUNTER — Ambulatory Visit (INDEPENDENT_AMBULATORY_CARE_PROVIDER_SITE_OTHER): Payer: Self-pay | Admitting: Orthopedic Surgery

## 2023-03-30 ENCOUNTER — Other Ambulatory Visit (HOSPITAL_BASED_OUTPATIENT_CLINIC_OR_DEPARTMENT_OTHER): Payer: Self-pay

## 2023-03-30 DIAGNOSIS — M5416 Radiculopathy, lumbar region: Secondary | ICD-10-CM

## 2023-03-30 MED ORDER — CELECOXIB 100 MG PO CAPS
100.0000 mg | ORAL_CAPSULE | Freq: Two times a day (BID) | ORAL | 0 refills | Status: AC
  Filled 2023-03-30 – 2023-03-31 (×2): qty 60, 30d supply, fill #0

## 2023-03-30 NOTE — Progress Notes (Addendum)
Orthopedic Spine Surgery Office Note   Assessment: Patient is a 72 y.o. female with low back pain that radiates into her bilateral lower extremities. 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     Plan: -Patient has tried conservative treatment over 1 year now without any relief of her symptoms so discussed surgery as a treatment option. She has now made significant improvement in her blood sugar control and her most recent A1c was 7.6 so I feel that she is a better operative candidate. Discussed indirect decompression with L2/3 & L3/4 XLIF and extension of her posterior fusion to L2. After this discussion, patient elected to proceed -Her colon cancer was local. It was excised. She has not and does not need any chemo/radiating per her report -Patient did not get her EMG/NCS. I told her that some of her pain seems neuropathic in nature and would not get better with surgery. Has previous ABIs that were normal.  -Return to office in 8 weeks, x-rays at next visit: None     The patient has symptoms consistent with lumbar stenosis. The patient's symptoms did not improve with conservative treatment so operative management was discussed in the form of L2/3 and L3/4 XLIF with posterior instrumented spinal fusion. The risks including but not limited to dural tear, nerve root injury, paralysis, pseudarthrosis, persistent pain, infection, bleeding, hardware failure, adjacent segment disease, vascular injury, psoas weakness, lumbar plexus injury, heart attack, death, stroke, fracture, dvt/pe, and need for additional procedures were discussed with the patient. Since this will be an indirect decompression, there is a risk of persistent symptoms which the patient understood after explaining the reason. She also has symptoms consistent with neuropathy that I do not expect to get better after surgery. The benefit of the surgery would be improvement in the patient's radiating anterior thigh and groin pain. I  explained that back pain relief is not the goal of the surgery and it is not reliably alleviated with this surgery. The alternatives to surgical management were covered with the patient and included continued monitoring, physical therapy, over-the-counter pain medications, ambulatory aids, injections, and activity modification. All the patient's questions were answered to her and her daughter's satisfaction. After this discussion, the patient expressed understanding and elected to proceed with surgical intervention.      __________________________________________________________________________   History: Patient is a 72 y.o. female who has been previously seen in the office for symptoms of lumbar radiculopathy. She comes back in today with worsening bilateral lower extremity pain. She feels the pain starts in her back and goes into her anterior and lateral thighs. It sometimes radiates into the bilateral anterolateral legs. She has decreased sensation/paresthesias/pain in her bilateral feet up to the level of the knees. She says that the pain has gotten worse she was last seen but she has not developed any new symptoms.    Previous treatments: L2-3 steroid injection, ibuprofen, activity modification, cane use, Robaxin     Physical Exam:   General: no acute distress, appears stated age Neurologic: alert, answering questions appropriately, following commands Respiratory: unlabored breathing on room air, symmetric chest rise Psychiatric: appropriate affect, normal cadence to speech     MSK (spine):   -Strength exam  Left                  Right   EHL                              4/5                  4/5 TA                                 5/5                  5/5 GSC                             5/5                  5/5 Knee extension            5/5                  5/5 Hip flexion                    4+/5                4+/5   -Sensory exam                            Sensation intact to light touch in L2-S1 nerve distributions of bilateral lower extremities (decreased distal to the knees bilaterally)   -Achilles DTR: 1/4 on the left, 1/4 on the right -Patellar tendon DTR: 1/4 on the left, 1/4 on the right  -No beats of clonus bilaterally -Negative straight leg raise bilaterally   Left hip exam: No pain through range of motion, negative Stinchfield, negative FABER Right hip exam: No pain through range of motion, negative Stinchfield, negative FABER     Imaging: X-rays of the lumbar spine from 11/24/2022 were previously independently reviewed and interpreted, showing posterior instrumentation from L4-S1.  Screws not backed out.  There is no lucency around the screws.  There are interbody devices at L4/5 and L5/S1 that appear in appropriate position.  Disc height loss is seen at L1/2, L2/3, L3/4.  No evidence of instability on flexion/extension views.  Vacuum disc phenomenon at L2/3 and L3/4.  No fracture or dislocation seen.  PI of 43, LL of 28   MRI of the lumbar spine from 09/22/2021 was previously independently reviewed and interpreted, showing transitional anatomy.  For labeling purposes, the lowest instrumented level is S1.  DDD at L2/3 and L3/4.  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  The foraminal stenosis is more significant at L3/4.  Lateral recess stenosis at L2-3.  Laminectomy defects from L2-S1.     Patient name: Julia Knight Patient MRN: [id number] Date of visit: 03/30/23

## 2023-03-31 ENCOUNTER — Other Ambulatory Visit (HOSPITAL_BASED_OUTPATIENT_CLINIC_OR_DEPARTMENT_OTHER): Payer: Self-pay

## 2023-03-31 ENCOUNTER — Other Ambulatory Visit (HOSPITAL_COMMUNITY): Payer: Self-pay

## 2023-03-31 ENCOUNTER — Other Ambulatory Visit: Payer: Self-pay

## 2023-04-04 ENCOUNTER — Ambulatory Visit: Payer: Self-pay | Admitting: Nurse Practitioner

## 2023-04-04 NOTE — Progress Notes (Deleted)
     04/04/2023 Julia Knight [id number] 05/22/1950   Chief Complaint:  History of Present Illness: Julia Knight is a 72 year old female with a past medical history of anxiety, depression, arthritis, hypertension, hyperlipidemia and GERD. She presents to our office today as referred by Dr. de Peru for further evaluation regarding possible hematemesis and GERD.  She is accompanied by a [place] Spanish interpreter to facilitate communication throughout today's office visit.   EGD 10/13/2022: - Esophagitis with no bleeding. Biopsied.  - Gastritis. Biopsied.  - Normal examined duodenum. Biopsied  Colonoscopy 11/15/2022: - The examined portion of the ileum was normal.  - One 12 mm polyp in the ascending colon, removed with a cold snare. Resected and retrieved.  - Non-bleeding internal hemorrhoids.   Current Medications, Allergies, Past Medical History, Past Surgical History, Family History and Social History were reviewed in [employer] record.   Review of Systems:   Constitutional: Negative for fever, sweats, chills or weight loss.  Respiratory: Negative for shortness of breath.   Cardiovascular: Negative for chest pain, palpitations and leg swelling.  Gastrointestinal: See HPI.  Musculoskeletal: Negative for back pain or muscle aches.  Neurological: Negative for dizziness, headaches or paresthesias.    Physical Exam: There were no vitals taken for this visit. General: in no acute distress. Head: Normocephalic and atraumatic. Eyes: No scleral icterus. Conjunctiva pink . Ears: Normal auditory acuity. Mouth: Dentition intact. No ulcers or lesions.  Lungs: Clear throughout to auscultation. Heart: Regular rate and rhythm, no murmur. Abdomen: Soft, nontender and nondistended. No masses or hepatomegaly. Normal bowel sounds x 4 quadrants.  Rectal: Deferred.  Musculoskeletal: Symmetrical with no gross deformities. Extremities: No  edema. Neurological: Alert oriented x 4. No focal deficits.  Psychological: Alert and cooperative. Normal mood and affect  Assessment and Recommendations:   72 year old female with heartburn, epigastric and LUQ pain. + Chronic NSAID use.     Colon cancer screening  Occasional constipation -MiraLAX nightly as needed   Mild anemia    Elevated alk phophatase level. Normal T. Bili, AST/ALT levels. CTAP 08/06/2022 showed a normal liver without biliary ductal dilatation.

## 2023-04-12 ENCOUNTER — Ambulatory Visit (INDEPENDENT_AMBULATORY_CARE_PROVIDER_SITE_OTHER): Payer: Self-pay | Admitting: Family Medicine

## 2023-04-12 ENCOUNTER — Encounter (HOSPITAL_BASED_OUTPATIENT_CLINIC_OR_DEPARTMENT_OTHER): Payer: Self-pay | Admitting: Family Medicine

## 2023-04-12 VITALS — BP 113/61 | HR 89 | Ht 61.0 in | Wt 170.6 lb

## 2023-04-12 DIAGNOSIS — E1165 Type 2 diabetes mellitus with hyperglycemia: Secondary | ICD-10-CM

## 2023-04-12 DIAGNOSIS — Z7984 Long term (current) use of oral hypoglycemic drugs: Secondary | ICD-10-CM

## 2023-04-12 DIAGNOSIS — Z01818 Encounter for other preprocedural examination: Secondary | ICD-10-CM

## 2023-04-12 DIAGNOSIS — N1831 Chronic kidney disease, stage 3a: Secondary | ICD-10-CM | POA: Insufficient documentation

## 2023-04-12 DIAGNOSIS — Z23 Encounter for immunization: Secondary | ICD-10-CM

## 2023-04-12 NOTE — Progress Notes (Signed)
Subjective:       Julia Knight 04/03/1951 04/12/2023  Chief Complaint  Patient presents with   surgical clearance    Patient is needing to have surgery clearance for lower back surgery. Denies any current concerns.     HPI: Pt is a 72 y.o. female who is here for preoperative clearance for decompression of L2/L3 and L3/L4 XLIf and extension of posterior fusion to L2 with Dr. Willia Craze with [facility].   1) High Risk Cardiac Conditions:  1) Recent MI - No.  2) Decompensated Heart Failure - No.  3) Unstable angina - No.  4) Symptomatic arrythmia - No.  5) Sx Valvular Disease - No.  2) Intermediate Risk Factors: DM, CKD, CVA, CHF, CAD - Yes.    Type 2 DM: Reports glucose is average 118 daily. She currently takes Glipizide 5mg  BID and metformin 1000mg  BID.   - Last A1c 8.2 on 01/20/23.     CKD Stage 3a: Will repeat BMP today with labs to evaluate renal function. Currently taking Celebrex for pain relief per Ortho. She does hydrate well. She is on ACE Inhibitor (Lisinopril) and BP well controlled.   Lab Results  Component Value Date   NA 137 04/12/2023   CL 104 04/12/2023   K 5.9 (H) 04/12/2023   CO2 16 (L) 04/12/2023   BUN 40 (H) 04/12/2023   CREATININE 1.43 (H) 04/12/2023   EGFR 39 (L) 04/12/2023   CALCIUM 10.3 04/12/2023   ALBUMIN 4.6 11/03/2022   GLUCOSE 88 04/12/2023    2) Functional Status: > 4 mets (Walk, run, climb stairs) Yes.   > 4 METS less than 10 . Duke Activity Status Index: 10.7 Points   3) Surgery Specific Risk:   Intermediate (Carotid, Head and Neck, Orthopaedic )  4) Further Noninvasive evaluation:   1) EKG - Yes.     Hx of MI, CVA, CAD, DM, CKD  2) Echo - No.   Worsening dyspnea or CHF without an echo in the past year  3) Stress Testing - Active Cardiac Disease - No.  4) CXR No.   If asymptomatic, healthy, no respiratory symptoms it is not needed  5)PFTs No.   OSA, OHS, or significant cardiopulmonary  history  5) Need for medical therapy - Beta Blocker, Statins indicated ? Yes.  Currently taking Atorvastatin   RCRI: Class 1 Risk   New complaints: None   Relevant past medical, surgical, family and social history reviewed and updated as indicated. Interim medical history since our last visit reviewed.  Allergies and medications reviewed and updated.  Patient Active Problem List   Diagnosis Date Noted   CKD stage 3a, GFR 45-59 ml/min (HCC) 04/12/2023   Pulmonary nodule 12/13/2022   Malignant neoplasm of colon, unspecified (HCC) 12/08/2022   Wellness examination 10/13/2022   Balance problem 09/16/2022   Paresthesias 09/16/2022   Weakness of both arms 09/16/2022   Midline low back pain with right-sided sciatica 02/01/2022   Right hip pain 02/01/2022   Peripheral polyneuropathy 02/01/2022   Nonalcoholic hepatosteatosis 03/29/2021   Pain of upper abdomen 03/19/2021   Nausea 03/19/2021   Hematemesis with nausea 03/19/2021   Sore throat 03/19/2021   Sinobronchitis 03/19/2021   Pressure injury of sacral region, stage 2 (HCC) 09/18/2020   Postoperative anemia due to acute blood loss 09/03/2020    Class: Acute   Spinal stenosis, 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   Spondylolisthesis at L4-L5 level 09/01/2020    Class: Chronic  Status post lumbar spinal fusion 09/01/2020   Hyperlipidemia 07/24/2020   BMI 34.0-34.9,adult 05/22/2020   Spondylolisthesis at L3-L4 level 05/22/2020   Anxiety 06/07/2018   Pre-ulcerative corn or callous 02/10/2017   Acute appendicitis 08/09/2013   Acute appendicitis with rupture 08/09/2013   Leukocytosis 08/09/2013   GERD (gastroesophageal reflux disease) 06/03/2013   UTI (urinary tract infection) 06/03/2013   Pyelonephritis 04/25/2013   Left-sided low back pain with sciatica 10/09/2012   Left leg pain 08/14/2012   Depression 03/08/2012   Primary hypertension 08/24/2011   T2DM (type 2 diabetes mellitus) (HCC) 02/28/2011    Pharyngitis 02/28/2011   Left knee pain 02/28/2011   Past Medical History:  Diagnosis Date   Acute blood loss anemia 09/10/2020   Allergic rhinitis 08/24/2011   Allergy    Anxiety    Arthritis    back and hands   Constipation due to opioid therapy 09/18/2020   COVID-19 11/16/2018   Depression    Diabetes mellitus without complication (HCC)    type 2   Dizziness 08/16/2017   Encounter for medical examination to establish care 07/24/2020   Gastroenteritis, infectious, presumed 08/16/2017   GERD (gastroesophageal reflux disease)    Glossitis 09/10/2020   Hyperlipemia    Hypertension    Neuromuscular disorder (HCC)    Past Surgical History:  Procedure Laterality Date   APPENDECTOMY     CHOLECYSTECTOMY     TONSILLECTOMY     removed as an adult   UPPER GASTROINTESTINAL ENDOSCOPY  10/13/2022   Family History  Problem Relation Age of Onset   Diabetes Brother    Colon cancer Neg Hx    Esophageal cancer Neg Hx    Rectal cancer Neg Hx    Stomach cancer Neg Hx    Outpatient Medications Prior to Visit  Medication Sig Dispense Refill   atorvastatin (LIPITOR) 40 MG tablet Take 1 tablet (40 mg total) by mouth at bedtime. 90 tablet 3   calcium carbonate (OSCAL) 1500 (600 Ca) MG TABS tablet Take 600 mg of elemental calcium by mouth in the morning.     celecoxib (CELEBREX) 100 MG capsule Take 1 capsule (100 mg total) by mouth 2 (two) times daily. 60 capsule 0   cyclobenzaprine (FLEXERIL) 10 MG tablet Take 1 tablet (10 mg total) by mouth 3 (three) times daily as needed for muscle spasms. 50 tablet 1   glipiZIDE (GLUCOTROL) 5 MG tablet Take 1 tablet (5 mg total) by mouth 2 (two) times daily before a meal. 180 tablet 1   Glucosamine Sulfate 500 MG TABS Take by mouth.     IBUPROFEN PO Take by mouth as needed (cramps).     Iron, Ferrous Sulfate, 325 (65 Fe) MG TABS Take 1 tablet by mouth every Monday, Wednesday, and Friday. 30 tablet 0   lisinopril (ZESTRIL) 20 MG tablet Take 1 tablet (20 mg total)  by mouth daily. 90 tablet 3   metFORMIN (GLUCOPHAGE) 1000 MG tablet Take 1 tablet (1,000 mg total) by mouth 2 (two) times daily (in the morning and evening with a meal). 180 tablet 3   Multiple Vitamin (MULTIVITAMIN WITH MINERALS) TABS tablet Take 1 tablet by mouth in the morning.     omega-3 acid ethyl esters (LOVAZA) 1 g capsule Take 1 g by mouth daily.     Omega-3 Fatty Acids (FISH OIL) 1000 MG CAPS Take 1 capsule by mouth daily.     omeprazole (PRILOSEC) 40 MG capsule Take 1 capsule (40 mg total) by mouth in the  morning and at bedtime. 120 capsule 1   ondansetron (ZOFRAN) 4 MG tablet Take 1 tablet (4 mg total) by mouth every 8 (eight) hours as needed for nausea or vomiting. 20 tablet 0   oxyCODONE (OXY IR/ROXICODONE) 5 MG immediate release tablet Take by mouth.     No facility-administered medications prior to visit.   No Known Allergies   ROS: Negative unless specifically indicated above in HPI.    Current Outpatient Medications:    atorvastatin (LIPITOR) 40 MG tablet, Take 1 tablet (40 mg total) by mouth at bedtime., Disp: 90 tablet, Rfl: 3   calcium carbonate (OSCAL) 1500 (600 Ca) MG TABS tablet, Take 600 mg of elemental calcium by mouth in the morning., Disp: , Rfl:    celecoxib (CELEBREX) 100 MG capsule, Take 1 capsule (100 mg total) by mouth 2 (two) times daily., Disp: 60 capsule, Rfl: 0   cyclobenzaprine (FLEXERIL) 10 MG tablet, Take 1 tablet (10 mg total) by mouth 3 (three) times daily as needed for muscle spasms., Disp: 50 tablet, Rfl: 1   glipiZIDE (GLUCOTROL) 5 MG tablet, Take 1 tablet (5 mg total) by mouth 2 (two) times daily before a meal., Disp: 180 tablet, Rfl: 1   Glucosamine Sulfate 500 MG TABS, Take by mouth., Disp: , Rfl:    IBUPROFEN PO, Take by mouth as needed (cramps)., Disp: , Rfl:    Iron, Ferrous Sulfate, 325 (65 Fe) MG TABS, Take 1 tablet by mouth every Monday, Wednesday, and Friday., Disp: 30 tablet, Rfl: 0   lisinopril (ZESTRIL) 20 MG tablet, Take 1 tablet  (20 mg total) by mouth daily., Disp: 90 tablet, Rfl: 3   metFORMIN (GLUCOPHAGE) 1000 MG tablet, Take 1 tablet (1,000 mg total) by mouth 2 (two) times daily (in the morning and evening with a meal)., Disp: 180 tablet, Rfl: 3   Multiple Vitamin (MULTIVITAMIN WITH MINERALS) TABS tablet, Take 1 tablet by mouth in the morning., Disp: , Rfl:    omega-3 acid ethyl esters (LOVAZA) 1 g capsule, Take 1 g by mouth daily., Disp: , Rfl:    Omega-3 Fatty Acids (FISH OIL) 1000 MG CAPS, Take 1 capsule by mouth daily., Disp: , Rfl:    omeprazole (PRILOSEC) 40 MG capsule, Take 1 capsule (40 mg total) by mouth in the morning and at bedtime., Disp: 120 capsule, Rfl: 1   ondansetron (ZOFRAN) 4 MG tablet, Take 1 tablet (4 mg total) by mouth every 8 (eight) hours as needed for nausea or vomiting., Disp: 20 tablet, Rfl: 0   oxyCODONE (OXY IR/ROXICODONE) 5 MG immediate release tablet, Take by mouth., Disp: , Rfl:       Objective:    Today's Vitals   04/12/23 1422  BP: 113/61  Pulse: 89  SpO2: 96%  Weight: 170 lb 9.6 oz (77.4 kg)  Height: 5\' 1"  (1.549 m)    Wt Readings from Last 3 Encounters:  04/12/23 170 lb 9.6 oz (77.4 kg)  12/13/22 178 lb 14.4 oz (81.1 kg)  11/15/22 177 lb 3.2 oz (80.4 kg)    GENERAL: Well-appearing, in NAD. Well nourished.  SKIN: Pink, warm and dry. No rash, lesion, ulceration, or ecchymoses.  Head: Normocephalic. NECK: Trachea midline. Full ROM w/o pain or tenderness.  RESPIRATORY: Chest wall symmetrical. Respirations even and non-labored. Breath sounds clear to auscultation bilaterally.  CARDIAC: S1, S2 present, regular rate and rhythm without murmur or gallops. Peripheral pulses 2+ bilaterally.  MSK: Muscle tone and strength appropriate for age. Midline spinal healed surgical incision present.  EXTREMITIES: Without clubbing, cyanosis, or edema.  NEUROLOGIC: No motor or sensory deficits. Steady, even gait. C2-C12 intact.  PSYCH/MENTAL STATUS: Alert, oriented x 3. Cooperative,  appropriate mood and affect.     EKG: normal EKG, normal sinus rhythm, unchanged from previous tracings.   Assessment & Plan:  1. Pre-operative clearance Surgical clearance completed. Patient will follow Ortho instructions to schedule.   2. CKD stage 3a, GFR 45-59 ml/min (HCC) Renal function declined possibly due to Celebrex use. Will continue on ACE/ARB therapy, renal diet, and NSAID avoidance. Will increase clear hydration and repeat in 2 weeks at next visit.  - Basic Metabolic Panel (BMET)  3. Type 2 diabetes mellitus with hyperglycemia, without long-term current use of insulin (HCC) Will obtain A1C within 2 weeks per insurance to assess glycemic control and operative risk with DM.   4. Immunization due - Pneumococcal conjugate vaccine 20-valent (Prevnar 20) - Flu Vaccine Trivalent High Dose (Fluad)   I have independently evaluated patient.  Julia Knight is a 72 y.o. female who is moderate risk for a intermediate risk surgery.  There are modifiable risk factors with improvement of A1C and renal function.   Return in about 12 days (around 04/24/2023) for DIABETES CHECK UP (A1C at visit) .  Patient to reach out to office if new, worrisome, or unresolved symptoms arise or if no improvement in patient's condition. Patient verbalized understanding and is agreeable to treatment plan. All questions answered to patient's satisfaction.    Audie Pinto FNP-C

## 2023-04-13 LAB — BASIC METABOLIC PANEL
BUN/Creatinine Ratio: 28 (ref 12–28)
BUN: 40 mg/dL — ABNORMAL HIGH (ref 8–27)
CO2: 16 mmol/L — ABNORMAL LOW (ref 20–29)
Calcium: 10.3 mg/dL (ref 8.7–10.3)
Chloride: 104 mmol/L (ref 96–106)
Creatinine, Ser: 1.43 mg/dL — ABNORMAL HIGH (ref 0.57–1.00)
Glucose: 88 mg/dL (ref 70–99)
Potassium: 5.9 mmol/L — ABNORMAL HIGH (ref 3.5–5.2)
Sodium: 137 mmol/L (ref 134–144)
eGFR: 39 mL/min/{1.73_m2} — ABNORMAL LOW (ref >59)

## 2023-04-14 NOTE — Progress Notes (Signed)
Please call patient and advise her to increase her clear fluid intake to improve her kidney function.  It is slightly decreased from prior.  Her potassium is also slightly elevated and may be due to dehydration.  Will recheck this lab at her appointment in 2 weeks.

## 2023-04-26 ENCOUNTER — Ambulatory Visit (HOSPITAL_BASED_OUTPATIENT_CLINIC_OR_DEPARTMENT_OTHER): Payer: Self-pay | Admitting: Family Medicine

## 2023-04-26 ENCOUNTER — Encounter (HOSPITAL_BASED_OUTPATIENT_CLINIC_OR_DEPARTMENT_OTHER): Payer: Self-pay | Admitting: Family Medicine

## 2023-04-26 VITALS — BP 118/62 | HR 82 | Ht 61.0 in | Wt 168.4 lb

## 2023-04-26 DIAGNOSIS — N1831 Chronic kidney disease, stage 3a: Secondary | ICD-10-CM

## 2023-04-26 DIAGNOSIS — Z7984 Long term (current) use of oral hypoglycemic drugs: Secondary | ICD-10-CM

## 2023-04-26 DIAGNOSIS — M21612 Bunion of left foot: Secondary | ICD-10-CM

## 2023-04-26 DIAGNOSIS — G629 Polyneuropathy, unspecified: Secondary | ICD-10-CM | POA: Insufficient documentation

## 2023-04-26 DIAGNOSIS — E1165 Type 2 diabetes mellitus with hyperglycemia: Secondary | ICD-10-CM

## 2023-04-26 LAB — POCT GLYCOSYLATED HEMOGLOBIN (HGB A1C)
HbA1c POC (<> result, manual entry): 7.5 % (ref 4.0–5.6)
HbA1c, POC (prediabetic range): 7.5 % — AB (ref 5.7–6.4)
Hemoglobin A1C: 7.5 % — AB (ref 4.0–5.6)

## 2023-04-26 NOTE — Patient Instructions (Signed)
Please call your surgeon to schedule.    Please use Bunion cushions for your left toe and follow up with Podiatry.

## 2023-04-26 NOTE — Progress Notes (Signed)
Subjective:   Julia Knight 02-06-1951 04/26/2023  Chief Complaint  Patient presents with   Follow-up    Follow up diabetes     HPI: Julia Knight presents today for re-assessment and management of chronic medical conditions.  Julia Knight, Spanish Interpeter at bedside.   DIABETES MELLITUS: Julia Knight presents for the medical management of diabetes.  Current diabetes medication regimen: Glipizide 10mg  BID and Metformin 1000mg  BID Patient is  adhering to a diabetic diet.  Patient is not exercising regularly.  Patient is  checking BS regularly (states it was 6 months ago).  Patient is  checking their feet regularly.  Denies polydipsia, polyphagia, polyuria, open wounds or ulcers on feet.   Lab Results  Component Value Date   HGBA1C 7.5 (A) 04/26/2023   HGBA1C 7.5 04/26/2023   HGBA1C 7.5 (A) 04/26/2023    04/26/2023 Lab Results  Component Value Date   LABMICR 3.1 12/13/2022   MICROALBUR 10 10/26/2020    Wt Readings from Last 3 Encounters:  04/26/23 168 lb 6.4 oz (76.4 kg)  04/12/23 170 lb 9.6 oz (77.4 kg)  12/13/22 178 lb 14.4 oz (81.1 kg)    CHRONIC KIDNEY DISEASE: Julia Knight presents for the medical management of Chronic Kidney Disease.  Patient is  adhering to renal diet. Patient is  on ACE1/ARB therapy- Lisinopril 20mg  Patient is  taking Celebrex for back pain currently but is avoiding additional NSAIDS.   Her BMP was drawn on 12/04 and showed mild decreased in her GFR and increase in her Potassium to 5.9. Recommend patient to increase clear fluid intake and we would repeat her lab today. She is asymptomatic for hyperkalemia or renal injury.    Lab Results  Component Value Date   NA 137 04/12/2023   K 5.9 (H) 04/12/2023   CO2 16 (L) 04/12/2023   GLUCOSE 88 04/12/2023   BUN 40 (H) 04/12/2023   CREATININE 1.43 (H) 04/12/2023   CALCIUM 10.3 04/12/2023   GFR 52.86 (L) 09/20/2022   EGFR 39 (L)  04/12/2023   GFRNONAA 48 (L) 08/05/2022     The following portions of the patient's history were reviewed and updated as appropriate: past medical history, past surgical history, family history, social history, allergies, medications, and problem list.   Patient Active Problem List   Diagnosis Date Noted   Neuropathy 04/26/2023   CKD stage 3a, GFR 45-59 ml/min (HCC) 04/12/2023   Pulmonary nodule 12/13/2022   Malignant neoplasm of colon, unspecified (HCC) 12/08/2022   Wellness examination 10/13/2022   Balance problem 09/16/2022   Paresthesias 09/16/2022   Weakness of both arms 09/16/2022   Midline low back pain with right-sided sciatica 02/01/2022   Right hip pain 02/01/2022   Peripheral polyneuropathy 02/01/2022   Nonalcoholic hepatosteatosis 03/29/2021   Pain of upper abdomen 03/19/2021   Nausea 03/19/2021   Hematemesis with nausea 03/19/2021   Sore throat 03/19/2021   Sinobronchitis 03/19/2021   Pressure injury of sacral region, stage 2 (HCC) 09/18/2020   Postoperative anemia due to acute blood loss 09/03/2020    Class: Acute   Spinal stenosis, 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   Spondylolisthesis at L4-L5 level 09/01/2020    Class: Chronic   Status post lumbar spinal fusion 09/01/2020   Hyperlipidemia 07/24/2020   BMI 34.0-34.9,adult 05/22/2020   Spondylolisthesis at L3-L4 level 05/22/2020   Anxiety 06/07/2018   Pre-ulcerative corn or callous 02/10/2017   Acute appendicitis 08/09/2013   Acute appendicitis with rupture 08/09/2013  Leukocytosis 08/09/2013   GERD (gastroesophageal reflux disease) 06/03/2013   UTI (urinary tract infection) 06/03/2013   Pyelonephritis 04/25/2013   Left-sided low back pain with sciatica 10/09/2012   Left leg pain 08/14/2012   Depression 03/08/2012   Primary hypertension 08/24/2011   T2DM (type 2 diabetes mellitus) (HCC) 02/28/2011   Pharyngitis 02/28/2011   Left knee pain 02/28/2011   Past Medical History:   Diagnosis Date   Acute blood loss anemia 09/10/2020   Allergic rhinitis 08/24/2011   Allergy    Anxiety    Arthritis    back and hands   Constipation due to opioid therapy 09/18/2020   COVID-19 11/16/2018   Depression    Diabetes mellitus without complication (HCC)    type 2   Dizziness 08/16/2017   Encounter for medical examination to establish care 07/24/2020   Gastroenteritis, infectious, presumed 08/16/2017   GERD (gastroesophageal reflux disease)    Glossitis 09/10/2020   Hyperlipemia    Hypertension    Neuromuscular disorder (HCC)    Past Surgical History:  Procedure Laterality Date   APPENDECTOMY     CHOLECYSTECTOMY     TONSILLECTOMY     removed as an adult   UPPER GASTROINTESTINAL ENDOSCOPY  10/13/2022   Family History  Problem Relation Age of Onset   Diabetes Brother    Colon cancer Neg Hx    Esophageal cancer Neg Hx    Rectal cancer Neg Hx    Stomach cancer Neg Hx    Outpatient Medications Prior to Visit  Medication Sig Dispense Refill   atorvastatin (LIPITOR) 40 MG tablet Take 1 tablet (40 mg total) by mouth at bedtime. 90 tablet 3   calcium carbonate (OSCAL) 1500 (600 Ca) MG TABS tablet Take 600 mg of elemental calcium by mouth in the morning.     celecoxib (CELEBREX) 100 MG capsule Take 1 capsule (100 mg total) by mouth 2 (two) times daily. 60 capsule 0   cyclobenzaprine (FLEXERIL) 10 MG tablet Take 1 tablet (10 mg total) by mouth 3 (three) times daily as needed for muscle spasms. 50 tablet 1   glipiZIDE (GLUCOTROL) 5 MG tablet Take 1 tablet (5 mg total) by mouth 2 (two) times daily before a meal. 180 tablet 1   Glucosamine Sulfate 500 MG TABS Take by mouth.     lisinopril (ZESTRIL) 20 MG tablet Take 1 tablet (20 mg total) by mouth daily. 90 tablet 3   metFORMIN (GLUCOPHAGE) 1000 MG tablet Take 1 tablet (1,000 mg total) by mouth 2 (two) times daily (in the morning and evening with a meal). 180 tablet 3   Multiple Vitamin (MULTIVITAMIN WITH MINERALS) TABS tablet  Take 1 tablet by mouth in the morning.     omeprazole (PRILOSEC) 40 MG capsule Take 1 capsule (40 mg total) by mouth in the morning and at bedtime. 120 capsule 1   ondansetron (ZOFRAN) 4 MG tablet Take 1 tablet (4 mg total) by mouth every 8 (eight) hours as needed for nausea or vomiting. 20 tablet 0   oxyCODONE (OXY IR/ROXICODONE) 5 MG immediate release tablet Take by mouth.     IBUPROFEN PO Take by mouth as needed (cramps). (Patient not taking: Reported on 04/26/2023)     Iron, Ferrous Sulfate, 325 (65 Fe) MG TABS Take 1 tablet by mouth every Monday, Wednesday, and Friday. (Patient not taking: Reported on 04/26/2023) 30 tablet 0   omega-3 acid ethyl esters (LOVAZA) 1 g capsule Take 1 g by mouth daily. (Patient not taking: Reported on  04/26/2023)     Omega-3 Fatty Acids (FISH OIL) 1000 MG CAPS Take 1 capsule by mouth daily. (Patient not taking: Reported on 04/26/2023)     No facility-administered medications prior to visit.   No Known Allergies   ROS: A complete ROS was performed with pertinent positives/negatives noted in the HPI. The remainder of the ROS are negative.    Objective:   Today's Vitals   04/26/23 1044  BP: 118/62  Pulse: 82  Weight: 168 lb 6.4 oz (76.4 kg)  Height: 5\' 1"  (1.549 m)  PainSc: 8   PainLoc: Foot    Physical Exam          GENERAL: Well-appearing, in NAD. Well nourished.  SKIN: Pink, warm and dry. No rash, lesion, ulceration, or ecchymoses.  Head: Normocephalic. NECK: Trachea midline. Full ROM w/o pain or tenderness.  RESPIRATORY: Chest wall symmetrical. Respirations even and non-labored. Breath sounds clear to auscultation bilaterally.  CARDIAC: S1, S2 present, regular rate and rhythm without murmur or gallops. Peripheral pulses 2+ bilaterally.  MSK: Muscle tone and strength appropriate for age.  EXTREMITIES: Without clubbing, cyanosis, or edema. Mild callous and slight erythema present to left lateral surface of left 5th toe.  NEUROLOGIC: No motor or  sensory deficits. Steady, even gait. C2-C12 intact.  PSYCH/MENTAL STATUS: Alert, oriented x 3. Cooperative, appropriate mood and affect.   Title   Diabetic Foot Exam - detailed Date & Time: 04/26/2023  1:21 PM Diabetic Foot exam was performed with the following findings: Yes  Visual Foot Exam completed.: Yes  Is there a history of foot ulcer?: No Is there a foot ulcer now?: No Is there swelling?: No Is there elevated skin temperature?: No Is there abnormal foot shape?: No Is there a claw toe deformity?: No Are the toenails long?: No Are the toenails thick?: Yes Are the toenails ingrown?: No Is the skin thin, fragile, shiny and hairless?": No Normal Range of Motion?: Yes Is there foot or ankle muscle weakness?: No Do you have pain in calf while walking?: No Are the shoes appropriate in style and fit?: Yes Can the patient see the bottom of their feet?: Yes Pulse Foot Exam completed.: Yes   Right Posterior Tibialis: Present Left posterior Tibialis: Present   Right Dorsalis Pedis: Present Left Dorsalis Pedis: Present     Semmes-Weinstein Monofilament Test "+" means "has sensation" and "-" means "no sensation"  R Foot Test Control: Neg L Foot Test Control: Neg   R Site 1-Great Toe: Neg L Site 1-Great Toe: Neg   R Site 4: Neg L Site 4: Neg   R site 5: Neg L Site 5: Neg  R Site 6: Neg L Site 6: Neg     Image components are not supported.   Image components are not supported. Image components are not supported.  Tuning Fork Right vibratory: diminished Left vibratory: diminished  Comments      Health Maintenance Due  Topic Date Due   OPHTHALMOLOGY EXAM  Never done   MAMMOGRAM  12/04/2022    Results for orders placed or performed in visit on 04/26/23  POCT glycosylated hemoglobin (Hb A1C)  Result Value Ref Range   Hemoglobin A1C 7.5 (A) 4.0 - 5.6 %   HbA1c POC (<> result, manual entry) 7.5 4.0 - 5.6 %   HbA1c, POC (prediabetic range) 7.5 (A) 5.7 - 6.4 %   HbA1c, POC  (controlled diabetic range)      The 10-year ASCVD risk score (Arnett DK, et al., 2019)  is: 19.2%     Assessment & Plan:  1. Type 2 diabetes mellitus with hyperglycemia, without long-term current use of insulin (HCC) (Primary) A1C improved. Continue current regimen. Patient doing well with diet and medication management. Recommend more frequent monitoring of blood glucose. Referral for DM eye exam placed. Foot exam completed today and patient has total peripheral neuropathy likely due to DM and neurogenic claudication from lumbar stenosis     - Ambulatory referral to Ophthalmology - Ambulatory referral to Podiatry - POCT glycosylated hemoglobin (Hb A1C)  2. CKD stage 3a, GFR 45-59 ml/min (HCC) Will repeat BMP today. Discussed possibility of stopping Glipizide and switching to Jardiance pending GFR. She is avoiding additional NSAIDS, staying well hydrated and following DM/renal diet.  - Basic Metabolic Panel (BMET)  3. Bunion of left foot Discussed OTC bandages to use and referral to Podiatry placed.  - Ambulatory referral to Podiatry  4. Peripheral polyneuropathy Foot exam completed today and patient has total peripheral neuropathy likely due to DM and neurogenic claudication from lumbar stenosis. Discussed to follow up with Ortho surgeon as directed for upcoming surgery.   Lab Orders         Basic Metabolic Panel (BMET)         POCT glycosylated hemoglobin (Hb A1C)     No images are attached to the encounter or orders placed in the encounter.  Return in about 4 months (around 08/25/2023) for DIABETES CHECK UP, HLD (fasting labs at visit) .    Patient to reach out to office if new, worrisome, or unresolved symptoms arise or if no improvement in patient's condition. Patient verbalized understanding and is agreeable to treatment plan. All questions answered to patient's satisfaction.    Hilbert Bible, [place]

## 2023-04-27 LAB — BASIC METABOLIC PANEL
BUN/Creatinine Ratio: 24 (ref 12–28)
BUN: 32 mg/dL — ABNORMAL HIGH (ref 8–27)
CO2: 13 mmol/L — ABNORMAL LOW (ref 20–29)
Calcium: 10 mg/dL (ref 8.7–10.3)
Chloride: 108 mmol/L — ABNORMAL HIGH (ref 96–106)
Creatinine, Ser: 1.31 mg/dL — ABNORMAL HIGH (ref 0.57–1.00)
Glucose: 80 mg/dL (ref 70–99)
Potassium: 6 mmol/L — ABNORMAL HIGH (ref 3.5–5.2)
Sodium: 137 mmol/L (ref 134–144)
eGFR: 44 mL/min/{1.73_m2} — ABNORMAL LOW (ref >59)

## 2023-04-27 NOTE — Progress Notes (Signed)
Please let patient know her kidney function has improved, but her potassium is still running high. This is likely due to her Lisinopril as it holds onto potassium in the body. I would like to switch her from lisinopril to amlodipine and repeat her BMP in 1-2 weeks. If she is agreeable, let me know and I will send the new medication in for her.

## 2023-05-11 ENCOUNTER — Encounter (HOSPITAL_COMMUNITY): Payer: Self-pay

## 2023-05-11 NOTE — Pre-Procedure Instructions (Signed)
 Surgical Instructions   Your procedure is scheduled on Tuesday, May 16, 2023. Report to [facility] Main Entrance A at 5:30 A.M., then check in with the Admitting office. Any questions or running late day of surgery: call [phone]  Questions prior to your surgery date: call [phone], Monday-Friday, 8am-4pm. If you experience any cold or flu symptoms such as cough, fever, chills, shortness of breath, etc. between now and your scheduled surgery, please notify us  at the above number.     Remember:  Do not eat after midnight the night before your surgery  You may drink clear liquids until 4:30am the morning of your surgery.   Clear liquids allowed are: Water, Non-Citrus Juices (without pulp), Carbonated Beverages, Clear Tea (no milk, honey, etc.), Black Coffee Only (NO MILK, CREAM OR POWDERED CREAMER of any kind), and Gatorade.  Patient Instructions  The night before surgery:  No food after midnight. ONLY clear liquids after midnight  The day of surgery (if you have diabetes): Drink ONE (1) 12 oz G2 Gatorade OR 8 ounces bottle of water given to you in your pre admission testing appointment by 4:30am the morning of surgery. Drink in one sitting. Do not sip.  This drink was given to you during your hospital  pre-op appointment visit.  Nothing else to drink after completing the  12 oz bottle of G2 or water.         If you have questions, please contact your surgeon's office.   Take these medicines the morning of surgery with A SIP OF WATER : Atorvastatin  (Lipitor) Omeprazole  (Prilosec)  May take these medicines IF NEEDED: Acetaminophen  (Tylenol ) Ondansetron  (Zofran ) Cyclobenzaprine  (Flexeril )  One week prior to surgery, STOP taking any Aspirin (unless otherwise instructed by your surgeon) Aleve, Naproxen, Ibuprofen, Motrin, Advil, Goody's, BC's, all herbal medications, fish oil, and non-prescription vitamins.  WHAT DO I DO ABOUT MY DIABETES MEDICATION?   Do not take  oral diabetes medicines (pills) the morning of surgery. NO METFORMIN  (GLUCOPHAGE ) OR GLIPIZIDE .  THE NIGHT BEFORE SURGERY, DO NOT TAKE YOUR EVENING DOSE OF GLIPIZIDE .    HOW TO MANAGE YOUR DIABETES BEFORE AND AFTER SURGERY  Why is it important to control my blood sugar before and after surgery? Improving blood sugar levels before and after surgery helps healing and can limit problems. A way of improving blood sugar control is eating a healthy diet by:  Eating less sugar and carbohydrates  Increasing activity/exercise  Talking with your doctor about reaching your blood sugar goals High blood sugars (greater than 180 mg/dL) can raise your risk of infections and slow your recovery, so you will need to focus on controlling your diabetes during the weeks before surgery. Make sure that the doctor who takes care of your diabetes knows about your planned surgery including the date and location.  How do I manage my blood sugar before surgery? Check your blood sugar at least 4 times a day, starting 2 days before surgery, to make sure that the level is not too high or low.  Check your blood sugar the morning of your surgery when you wake up and every 2 hours until you get to the Short Stay unit.  If your blood sugar is less than 70 mg/dL, you will need to treat for low blood sugar: Do not take insulin . Treat a low blood sugar (less than 70 mg/dL) with  cup of clear juice (cranberry or apple), 4 glucose tablets, OR glucose gel. Recheck blood sugar in 15 minutes after treatment (  to make sure it is greater than 70 mg/dL). If your blood sugar is not greater than 70 mg/dL on recheck, call [phone] for further instructions. Report your blood sugar to the short stay nurse when you get to Short Stay.  If you are admitted to the hospital after surgery: Your blood sugar will be checked by the staff and you will probably be given insulin  after surgery (instead of oral diabetes medicines) to make sure you  have good blood sugar levels. The goal for blood sugar control after surgery is 80-180 mg/dL.                      Do NOT Smoke (Tobacco/Vaping) for 24 hours prior to your procedure.  If you use a CPAP at night, you may bring your mask/headgear for your overnight stay.   You will be asked to remove any contacts, glasses, piercing's, hearing aid's, dentures/partials prior to surgery. Please bring cases for these items if needed.    Patients discharged the day of surgery will not be allowed to drive home, and someone needs to stay with them for 24 hours.  SURGICAL WAITING ROOM VISITATION Patients may have no more than 2 support people in the waiting area - these visitors may rotate.   Pre-op nurse will coordinate an appropriate time for 1 ADULT support person, who may not rotate, to accompany patient in pre-op.  Children under the age of 8 must have an adult with them who is not the patient and must remain in the main waiting area with an adult.  If the patient needs to stay at the hospital during part of their recovery, the visitor guidelines for inpatient rooms apply.  Please refer to the [facility] website for the visitor guidelines for any additional information.   If you received a COVID test during your pre-op visit  it is requested that you wear a mask when out in public, stay away from anyone that may not be feeling well and notify your surgeon if you develop symptoms. If you have been in contact with anyone that has tested positive in the last 10 days please notify you surgeon.      Pre-operative 5 CHG Bathing Instructions   You can play a key role in reducing the risk of infection after surgery. Your skin needs to be as free of germs as possible. You can reduce the number of germs on your skin by washing with CHG (chlorhexidine  gluconate) soap before surgery. CHG is an antiseptic soap that kills germs and continues to kill germs even after washing.   DO NOT use if you have  an allergy to chlorhexidine /CHG or antibacterial soaps. If your skin becomes reddened or irritated, stop using the CHG and notify one of our RNs at [phone].   Please shower with the CHG soap starting 4 days before surgery using the following schedule:     Please keep in mind the following:  DO NOT shave, including legs and underarms, starting the day of your first shower.   You may shave your face at any point before/day of surgery.  Place clean sheets on your bed the day you start using CHG soap. Use a clean washcloth (not used since being washed) for each shower. DO NOT sleep with pets once you start using the CHG.   CHG Shower Instructions:  Wash your face and private area with normal soap. If you choose to wash your hair, wash first with your normal shampoo.  After you use shampoo/soap, rinse your hair and body thoroughly to remove shampoo/soap residue.  Turn the water OFF and apply about 3 tablespoons (45 ml) of CHG soap to a CLEAN washcloth.  Apply CHG soap ONLY FROM YOUR NECK DOWN TO YOUR TOES (washing for 3-5 minutes)  DO NOT use CHG soap on face, private areas, open wounds, or sores.  Pay special attention to the area where your surgery is being performed.  If you are having back surgery, having someone wash your back for you may be helpful. Wait 2 minutes after CHG soap is applied, then you may rinse off the CHG soap.  Pat dry with a clean towel  Put on clean clothes/pajamas   If you choose to wear lotion, please use ONLY the CHG-compatible lotions on the back of this paper.   Additional instructions for the day of surgery: DO NOT APPLY any lotions, deodorants, cologne, or perfumes.   Do not bring valuables to the hospital. [facility] is not responsible for any belongings/valuables. Do not wear nail polish, gel polish, artificial nails, or any other type of covering on natural nails (fingers and toes) Do not wear jewelry or makeup Put on clean/comfortable clothes.   Please brush your teeth.  Ask your nurse before applying any prescription medications to the skin.     CHG Compatible Lotions   Aveeno Moisturizing lotion  Cetaphil Moisturizing Cream  Cetaphil Moisturizing Lotion  Clairol Herbal Essence Moisturizing Lotion, Dry Skin  Clairol Herbal Essence Moisturizing Lotion, Extra Dry Skin  Clairol Herbal Essence Moisturizing Lotion, Normal Skin  Curel Age Defying Therapeutic Moisturizing Lotion with Alpha Hydroxy  Curel Extreme Care Body Lotion  Curel Soothing Hands Moisturizing Hand Lotion  Curel Therapeutic Moisturizing Cream, Fragrance-Free  Curel Therapeutic Moisturizing Lotion, Fragrance-Free  Curel Therapeutic Moisturizing Lotion, Original Formula  Eucerin Daily Replenishing Lotion  Eucerin Dry Skin Therapy Plus Alpha Hydroxy Crme  Eucerin Dry Skin Therapy Plus Alpha Hydroxy Lotion  Eucerin Original Crme  Eucerin Original Lotion  Eucerin Plus Crme Eucerin Plus Lotion  Eucerin TriLipid Replenishing Lotion  Keri Anti-Bacterial Hand Lotion  Keri Deep Conditioning Original Lotion Dry Skin Formula Softly Scented  Keri Deep Conditioning Original Lotion, Fragrance Free Sensitive Skin Formula  Keri Lotion Fast Absorbing Fragrance Free Sensitive Skin Formula  Keri Lotion Fast Absorbing Softly Scented Dry Skin Formula  Keri Original Lotion  Keri Skin Renewal Lotion Keri Silky Smooth Lotion  Keri Silky Smooth Sensitive Skin Lotion  Nivea Body Creamy Conditioning Oil  Nivea Body Extra Enriched Lotion  Nivea Body Original Lotion  Nivea Body Sheer Moisturizing Lotion Nivea Crme  Nivea Skin Firming Lotion  NutraDerm 30 Skin Lotion  NutraDerm Skin Lotion  NutraDerm Therapeutic Skin Cream  NutraDerm Therapeutic Skin Lotion  ProShield Protective Hand Cream  Provon moisturizing lotion  Please read over the following fact sheets that you were given.

## 2023-05-12 ENCOUNTER — Encounter (HOSPITAL_COMMUNITY): Payer: Self-pay

## 2023-05-12 ENCOUNTER — Encounter (HOSPITAL_COMMUNITY)
Admission: RE | Admit: 2023-05-12 | Discharge: 2023-05-12 | Disposition: A | Discharge status: Home / Self Care | Payer: Self-pay | Source: Ambulatory Visit | Attending: Orthopedic Surgery | Admitting: Orthopedic Surgery

## 2023-05-12 ENCOUNTER — Other Ambulatory Visit: Payer: Self-pay

## 2023-05-12 DIAGNOSIS — Z87891 Personal history of nicotine dependence: Secondary | ICD-10-CM | POA: Insufficient documentation

## 2023-05-12 DIAGNOSIS — Z01818 Encounter for other preprocedural examination: Secondary | ICD-10-CM

## 2023-05-12 DIAGNOSIS — I10 Essential (primary) hypertension: Secondary | ICD-10-CM | POA: Insufficient documentation

## 2023-05-12 DIAGNOSIS — E785 Hyperlipidemia, unspecified: Secondary | ICD-10-CM | POA: Insufficient documentation

## 2023-05-12 DIAGNOSIS — Z01812 Encounter for preprocedural laboratory examination: Secondary | ICD-10-CM | POA: Insufficient documentation

## 2023-05-12 DIAGNOSIS — K219 Gastro-esophageal reflux disease without esophagitis: Secondary | ICD-10-CM | POA: Insufficient documentation

## 2023-05-12 DIAGNOSIS — D649 Anemia, unspecified: Secondary | ICD-10-CM | POA: Insufficient documentation

## 2023-05-12 DIAGNOSIS — E875 Hyperkalemia: Secondary | ICD-10-CM | POA: Insufficient documentation

## 2023-05-12 DIAGNOSIS — Z85038 Personal history of other malignant neoplasm of large intestine: Secondary | ICD-10-CM | POA: Insufficient documentation

## 2023-05-12 HISTORY — DX: Malignant (primary) neoplasm, unspecified: C80.1

## 2023-05-12 LAB — CBC
HCT: 33.7 % — ABNORMAL LOW (ref 36.0–46.0)
Hemoglobin: 11 g/dL — ABNORMAL LOW (ref 12.0–15.0)
MCH: 28.5 pg (ref 26.0–34.0)
MCHC: 32.6 g/dL (ref 30.0–36.0)
MCV: 87.3 fL (ref 80.0–100.0)
Platelets: 271 10*3/uL (ref 150–400)
RBC: 3.86 MIL/uL — ABNORMAL LOW (ref 3.87–5.11)
RDW: 13.8 % (ref 11.5–15.5)
WBC: 9.7 10*3/uL (ref 4.0–10.5)
nRBC: 0 % (ref 0.0–0.2)

## 2023-05-12 LAB — TYPE AND SCREEN
ABO/RH(D): O POS
Antibody Screen: NEGATIVE

## 2023-05-12 LAB — BASIC METABOLIC PANEL
Anion gap: 8 (ref 5–15)
BUN: 17 mg/dL (ref 8–23)
CO2: 20 mmol/L — ABNORMAL LOW (ref 22–32)
Calcium: 9.8 mg/dL (ref 8.9–10.3)
Chloride: 109 mmol/L (ref 98–111)
Creatinine, Ser: 1.04 mg/dL — ABNORMAL HIGH (ref 0.44–1.00)
GFR, Estimated: 58 mL/min — ABNORMAL LOW (ref >60)
Glucose, Bld: 120 mg/dL — ABNORMAL HIGH (ref 70–99)
Potassium: 5.5 mmol/L — ABNORMAL HIGH (ref 3.5–5.1)
Sodium: 137 mmol/L (ref 135–145)

## 2023-05-12 LAB — GLUCOSE, CAPILLARY: Glucose-Capillary: 116 mg/dL — ABNORMAL HIGH (ref 70–99)

## 2023-05-12 LAB — SURGICAL PCR SCREEN
MRSA, PCR: NEGATIVE
Staphylococcus aureus: NEGATIVE

## 2023-05-12 NOTE — Progress Notes (Signed)
 PCP - Knute Thersia Bitters, FNP  Cardiologist - denies  PPM/ICD - denies Device Orders - n/a Rep Notified - n/a  Chest x-ray -  EKG - 04-12-23 Stress Test - denies ECHO - denies Cardiac Cath - denies  Sleep Study - denies CPAP -   Fasting Blood Sugar - between 90-110 per patient Checks Blood Sugar BID times a day A1C 7.5 04-26-23  Blood Thinner Instructions: denies Aspirin Instructions:n/a  ERAS Protcol - clear liquids until 430 am  COVID TEST-  no   Anesthesia review: Yes HTN, CKD, DM  Patient denies shortness of breath, fever, cough and chest pain at PAT appointment   All instructions explained to the patient, with a verbal understanding of the material. Patient agrees to go over the instructions while at home for a better understanding. Patient also instructed to self quarantine after being tested for COVID-19. The opportunity to ask questions was provided.

## 2023-05-15 NOTE — Progress Notes (Signed)
 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  Preop labs notable for hyperkalemia with potassium 5.5. Kidney function near normal with creatinine 1.04, GFR 58. Review of recent labs shows mild chronic hyperkalemia (last 6.0 on 04/26/23). Per note on labs from 04/26/23, PCP recommended switching from lisinopril  to amlodipine  but it does not appear this has happened yet.   Potassium (mmol/L)  Date Value  05/12/2023 5.5 (H)  04/26/2023 6.0 (H)  04/12/2023 5.9 (H)  12/13/2022 5.5 (H)  11/03/2022 5.9 (H)   Follows with oncology at [facility] for hx of laparoscopic right hemicolectomy for adenocarcinoma of ascending colon 01/02/23. Cancer was fully resected, no chemo or radiation required. Doing well at followup with surgeon on 01/26/23.  Preop labs otherwise unremarkable, mild anemia Hgb 11.0.  EKG 04/12/23: NSR. Rate 86.    Lynwood Geofm RIGGERS [facility] Short Stay Center/Anesthesiology Phone [phone] 05/15/2023 9:15 AM

## 2023-05-15 NOTE — Anesthesia Preprocedure Evaluation (Addendum)
 Anesthesia Evaluation  Patient identified by MRN, date of birth, ID band Patient awake    Reviewed: Allergy & Precautions, NPO status , Patient's Chart, lab work & pertinent test results  History of Anesthesia Complications Negative for: history of anesthetic complications  Airway Mallampati: II  TM Distance: >3 FB Neck ROM: Full    Dental  (+) Missing, Dental Advisory Given   Pulmonary former smoker   breath sounds clear to auscultation       Cardiovascular hypertension, Pt. on medications (-) angina  Rhythm:Regular Rate:Normal     Neuro/Psych Chronic back pain: tylenol      GI/Hepatic Neg liver ROS,GERD  Medicated and Controlled,,H/o colon cancer   Endo/Other  diabetes (glu 137), Oral Hypoglycemic Agents  Class 3 obesity (BMI 32.7)  Renal/GU Renal InsufficiencyRenal disease     Musculoskeletal  (+) Arthritis ,    Abdominal   Peds  Hematology  (+) Blood dyscrasia (Hb 11, plt 271k), anemia   Anesthesia Other Findings   Reproductive/Obstetrics                             Anesthesia Physical Anesthesia Plan  ASA: 3  Anesthesia Plan: General   Post-op Pain Management: Tylenol  PO (pre-op)*   Induction: Intravenous  PONV Risk Score and Plan: Ondansetron , Dexamethasone  and Treatment may vary due to age or medical condition  Airway Management Planned: Oral ETT  Additional Equipment: None  Intra-op Plan:   Post-operative Plan: Extubation in OR  Informed Consent: I have reviewed the patients History and Physical, chart, labs and discussed the procedure including the risks, benefits and alternatives for the proposed anesthesia with the patient or authorized representative who has indicated his/her understanding and acceptance.     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  Preop labs notable for hyperkalemia with potassium 5.5. Kidney function near normal with creatinine 1.04, GFR 58. Review of recent labs shows mild chronic hyperkalemia (last 6.0 on 04/26/23). Per note on labs from 04/26/23, PCP recommended switching from lisinopril  to amlodipine  but it does not appear this has happened yet.   Potassium (mmol/L) Date Value 05/12/2023 5.5 (H) 04/26/2023 6.0 (H) 04/12/2023 5.9 (H) 12/13/2022 5.5 (H) 11/03/2022 5.9 (H)  Follows with oncology at [facility] for hx of laparoscopic right hemicolectomy for adenocarcinoma of ascending colon 01/02/23. Cancer was fully resected, no chemo or radiation required. Doing well at followup with surgeon on 01/26/23.  Preop labs otherwise unremarkable, mild anemia Hgb 11.0.  EKG 04/12/23: NSR. Rate 86.  )        Anesthesia Quick Evaluation

## 2023-05-16 ENCOUNTER — Inpatient Hospital Stay (HOSPITAL_COMMUNITY): Payer: Self-pay | Admitting: Physician Assistant

## 2023-05-16 ENCOUNTER — Encounter (HOSPITAL_COMMUNITY): Payer: Self-pay | Admitting: Orthopedic Surgery

## 2023-05-16 ENCOUNTER — Other Ambulatory Visit: Payer: Self-pay

## 2023-05-16 ENCOUNTER — Encounter (HOSPITAL_COMMUNITY)
Admission: RE | Disposition: A | Discharge status: Home / Self Care | Payer: Self-pay | Source: Home / Self Care | Attending: Orthopedic Surgery

## 2023-05-16 ENCOUNTER — Inpatient Hospital Stay (HOSPITAL_COMMUNITY): Payer: Self-pay | Admitting: Anesthesiology

## 2023-05-16 ENCOUNTER — Inpatient Hospital Stay (HOSPITAL_COMMUNITY)
Admission: RE | Admit: 2023-05-16 | Discharge: 2023-05-21 | DRG: 427 | Disposition: A | Discharge status: Home / Self Care | Payer: Self-pay | Attending: Orthopedic Surgery | Admitting: Orthopedic Surgery

## 2023-05-16 ENCOUNTER — Inpatient Hospital Stay (HOSPITAL_COMMUNITY): Payer: Self-pay

## 2023-05-16 DIAGNOSIS — Z01818 Encounter for other preprocedural examination: Principal | ICD-10-CM

## 2023-05-16 DIAGNOSIS — N1831 Chronic kidney disease, stage 3a: Secondary | ICD-10-CM

## 2023-05-16 DIAGNOSIS — G8929 Other chronic pain: Secondary | ICD-10-CM | POA: Diagnosis present

## 2023-05-16 DIAGNOSIS — M5416 Radiculopathy, lumbar region: Secondary | ICD-10-CM | POA: Diagnosis present

## 2023-05-16 DIAGNOSIS — I10 Essential (primary) hypertension: Secondary | ICD-10-CM | POA: Diagnosis present

## 2023-05-16 DIAGNOSIS — E66813 Obesity, class 3: Secondary | ICD-10-CM | POA: Diagnosis present

## 2023-05-16 DIAGNOSIS — M5116 Intervertebral disc disorders with radiculopathy, lumbar region: Secondary | ICD-10-CM

## 2023-05-16 DIAGNOSIS — Z6832 Body mass index (BMI) 32.0-32.9, adult: Secondary | ICD-10-CM

## 2023-05-16 DIAGNOSIS — D62 Acute posthemorrhagic anemia: Secondary | ICD-10-CM | POA: Diagnosis not present

## 2023-05-16 DIAGNOSIS — K219 Gastro-esophageal reflux disease without esophagitis: Secondary | ICD-10-CM | POA: Diagnosis present

## 2023-05-16 DIAGNOSIS — F419 Anxiety disorder, unspecified: Secondary | ICD-10-CM | POA: Diagnosis present

## 2023-05-16 DIAGNOSIS — N179 Acute kidney failure, unspecified: Secondary | ICD-10-CM | POA: Diagnosis not present

## 2023-05-16 DIAGNOSIS — E785 Hyperlipidemia, unspecified: Secondary | ICD-10-CM | POA: Diagnosis present

## 2023-05-16 DIAGNOSIS — I129 Hypertensive chronic kidney disease with stage 1 through stage 4 chronic kidney disease, or unspecified chronic kidney disease: Secondary | ICD-10-CM

## 2023-05-16 DIAGNOSIS — Z87891 Personal history of nicotine dependence: Secondary | ICD-10-CM

## 2023-05-16 DIAGNOSIS — Z85038 Personal history of other malignant neoplasm of large intestine: Secondary | ICD-10-CM

## 2023-05-16 DIAGNOSIS — E119 Type 2 diabetes mellitus without complications: Secondary | ICD-10-CM | POA: Diagnosis present

## 2023-05-16 DIAGNOSIS — M48061 Spinal stenosis, lumbar region without neurogenic claudication: Principal | ICD-10-CM | POA: Diagnosis present

## 2023-05-16 LAB — GLUCOSE, CAPILLARY
Glucose-Capillary: 137 mg/dL — ABNORMAL HIGH (ref 70–99)
Glucose-Capillary: 139 mg/dL — ABNORMAL HIGH (ref 70–99)
Glucose-Capillary: 223 mg/dL — ABNORMAL HIGH (ref 70–99)
Glucose-Capillary: 194 mg/dL — ABNORMAL HIGH (ref 70–99)

## 2023-05-16 SURGERY — POSTERIOR LUMBAR FUSION 2 LEVEL
Anesthesia: General | Site: Spine Lumbar

## 2023-05-16 MED ORDER — ADULT MULTIVITAMIN W/MINERALS CH
1.0000 | ORAL_TABLET | Freq: Every morning | ORAL | Status: DC
  Administered 2023-05-17 – 2023-05-21 (×5): 1 via ORAL
  Filled 2023-05-16 (×5): qty 1

## 2023-05-16 MED ORDER — MIDAZOLAM HCL 2 MG/2ML IJ SOLN
0.5000 mg | Freq: Once | INTRAMUSCULAR | Status: DC | PRN
Start: 2023-05-16 — End: 2023-05-16

## 2023-05-16 MED ORDER — INSULIN ASPART 100 UNIT/ML IJ SOLN
0.0000 [IU] | Freq: Every day | INTRAMUSCULAR | Status: DC
Start: 2023-05-16 — End: 2023-05-21

## 2023-05-16 MED ORDER — PROPOFOL 10 MG/ML IV BOLUS
INTRAVENOUS | Status: AC
  Filled 2023-05-16: qty 20

## 2023-05-16 MED ORDER — FENTANYL CITRATE (PF) 250 MCG/5ML IJ SOLN
INTRAMUSCULAR | Status: AC
  Filled 2023-05-16: qty 5

## 2023-05-16 MED ORDER — ACETAMINOPHEN 500 MG PO TABS
1000.0000 mg | ORAL_TABLET | Freq: Three times a day (TID) | ORAL | Status: DC
  Administered 2023-05-16 – 2023-05-21 (×14): 1000 mg via ORAL
  Filled 2023-05-16 (×14): qty 2

## 2023-05-16 MED ORDER — HYDROMORPHONE HCL 1 MG/ML IJ SOLN
INTRAMUSCULAR | Status: AC
  Filled 2023-05-16: qty 0.5

## 2023-05-16 MED ORDER — POVIDONE-IODINE 10 % EX SWAB: 2.0000 | Freq: Once | CUTANEOUS | Status: DC

## 2023-05-16 MED ORDER — ATORVASTATIN CALCIUM 40 MG PO TABS
40.0000 mg | ORAL_TABLET | Freq: Every morning | ORAL | Status: DC
  Administered 2023-05-17 – 2023-05-21 (×5): 40 mg via ORAL
  Filled 2023-05-16 (×5): qty 1

## 2023-05-16 MED ORDER — VANCOMYCIN HCL 1000 MG IV SOLR
INTRAVENOUS | Status: AC
  Filled 2023-05-16: qty 20

## 2023-05-16 MED ORDER — EPHEDRINE 5 MG/ML INJ
INTRAVENOUS | Status: AC
  Filled 2023-05-16: qty 5

## 2023-05-16 MED ORDER — PROPOFOL 500 MG/50ML IV EMUL
INTRAVENOUS | Status: DC | PRN
  Administered 2023-05-16: 75 ug/kg/min via INTRAVENOUS

## 2023-05-16 MED ORDER — INSULIN ASPART 100 UNIT/ML IJ SOLN: 0.0000 [IU] | INTRAMUSCULAR | Status: DC | PRN

## 2023-05-16 MED ORDER — CEFAZOLIN SODIUM-DEXTROSE 2-4 GM/100ML-% IV SOLN
2.0000 g | Freq: Four times a day (QID) | INTRAVENOUS | Status: AC
  Administered 2023-05-16 – 2023-05-17 (×2): 2 g via INTRAVENOUS
  Filled 2023-05-16 (×2): qty 100

## 2023-05-16 MED ORDER — EPHEDRINE SULFATE-NACL 50-0.9 MG/10ML-% IV SOSY
PREFILLED_SYRINGE | INTRAVENOUS | Status: DC | PRN
  Administered 2023-05-16: 5 mg via INTRAVENOUS

## 2023-05-16 MED ORDER — THROMBIN 20000 UNITS EX SOLR
CUTANEOUS | Status: DC | PRN
  Administered 2023-05-16: 20 mL via TOPICAL

## 2023-05-16 MED ORDER — HYDROMORPHONE HCL 1 MG/ML IJ SOLN
0.5000 mg | INTRAMUSCULAR | Status: AC | PRN
  Administered 2023-05-16 – 2023-05-17 (×4): 0.5 mg via INTRAVENOUS
  Filled 2023-05-16 (×4): qty 0.5

## 2023-05-16 MED ORDER — ORAL CARE MOUTH RINSE: 15.0000 mL | Freq: Once | OROMUCOSAL | Status: AC

## 2023-05-16 MED ORDER — SODIUM CHLORIDE 0.9 % IV SOLN
0.0125 ug/kg/min | INTRAVENOUS | Status: DC
  Filled 2023-05-16: qty 2000

## 2023-05-16 MED ORDER — THROMBIN 20000 UNITS EX SOLR
CUTANEOUS | Status: AC
  Filled 2023-05-16: qty 20000

## 2023-05-16 MED ORDER — GLYCOPYRROLATE PF 0.2 MG/ML IJ SOSY
PREFILLED_SYRINGE | INTRAMUSCULAR | Status: AC
  Filled 2023-05-16: qty 1

## 2023-05-16 MED ORDER — SODIUM CHLORIDE 0.9 % IV SOLN: 0.1500 ug/kg/min | INTRAVENOUS | Status: DC

## 2023-05-16 MED ORDER — OXYCODONE HCL 5 MG/5ML PO SOLN: 5.0000 mg | Freq: Once | ORAL | Status: DC | PRN

## 2023-05-16 MED ORDER — ACETAMINOPHEN 500 MG PO TABS
1000.0000 mg | ORAL_TABLET | Freq: Once | ORAL | Status: AC
  Administered 2023-05-16: 1000 mg via ORAL
  Filled 2023-05-16: qty 2

## 2023-05-16 MED ORDER — ONDANSETRON HCL 4 MG PO TABS: 4.0000 mg | ORAL_TABLET | Freq: Four times a day (QID) | ORAL | Status: DC | PRN

## 2023-05-16 MED ORDER — CYCLOBENZAPRINE HCL 10 MG PO TABS
10.0000 mg | ORAL_TABLET | Freq: Three times a day (TID) | ORAL | Status: DC
  Administered 2023-05-16 – 2023-05-21 (×15): 10 mg via ORAL
  Filled 2023-05-16 (×15): qty 1

## 2023-05-16 MED ORDER — SODIUM CHLORIDE 0.9 % IV SOLN
0.0125 ug/kg/min | INTRAVENOUS | Status: AC
  Administered 2023-05-16: .15 ug/kg/min via INTRAVENOUS
  Filled 2023-05-16: qty 2000

## 2023-05-16 MED ORDER — DEXAMETHASONE SODIUM PHOSPHATE 10 MG/ML IJ SOLN
10.0000 mg | Freq: Once | INTRAMUSCULAR | Status: AC
  Administered 2023-05-16: 10 mg via INTRAVENOUS

## 2023-05-16 MED ORDER — ROCURONIUM BROMIDE 10 MG/ML (PF) SYRINGE
PREFILLED_SYRINGE | INTRAVENOUS | Status: AC
  Filled 2023-05-16: qty 10

## 2023-05-16 MED ORDER — FENTANYL CITRATE (PF) 250 MCG/5ML IJ SOLN
INTRAMUSCULAR | Status: DC | PRN
  Administered 2023-05-16: 100 ug via INTRAVENOUS
  Administered 2023-05-16 (×2): 50 ug via INTRAVENOUS
  Administered 2023-05-16 (×2): 100 ug via INTRAVENOUS

## 2023-05-16 MED ORDER — LACTATED RINGERS IV SOLN: INTRAVENOUS | Status: DC | PRN

## 2023-05-16 MED ORDER — INSULIN ASPART 100 UNIT/ML IJ SOLN
0.0000 [IU] | Freq: Three times a day (TID) | INTRAMUSCULAR | Status: DC
  Administered 2023-05-17 (×2): 3 [IU] via SUBCUTANEOUS
  Administered 2023-05-17 – 2023-05-18 (×2): 2 [IU] via SUBCUTANEOUS
  Administered 2023-05-18: 5 [IU] via SUBCUTANEOUS
  Administered 2023-05-18: 2 [IU] via SUBCUTANEOUS
  Administered 2023-05-19: 3 [IU] via SUBCUTANEOUS
  Administered 2023-05-19: 2 [IU] via SUBCUTANEOUS
  Administered 2023-05-20: 3 [IU] via SUBCUTANEOUS
  Administered 2023-05-20: 2 [IU] via SUBCUTANEOUS

## 2023-05-16 MED ORDER — BUPIVACAINE-EPINEPHRINE (PF) 0.25% -1:200000 IJ SOLN
INTRAMUSCULAR | Status: AC
  Filled 2023-05-16: qty 30

## 2023-05-16 MED ORDER — METFORMIN HCL 500 MG PO TABS
1000.0000 mg | ORAL_TABLET | Freq: Two times a day (BID) | ORAL | Status: DC
  Administered 2023-05-16 – 2023-05-21 (×10): 1000 mg via ORAL
  Filled 2023-05-16 (×10): qty 2

## 2023-05-16 MED ORDER — PHENYLEPHRINE 80 MCG/ML (10ML) SYRINGE FOR IV PUSH (FOR BLOOD PRESSURE SUPPORT)
PREFILLED_SYRINGE | INTRAVENOUS | Status: AC
  Filled 2023-05-16: qty 10

## 2023-05-16 MED ORDER — CALCIUM CARBONATE 1250 (500 CA) MG PO TABS
1.0000 | ORAL_TABLET | Freq: Every day | ORAL | Status: DC
  Administered 2023-05-17 – 2023-05-21 (×5): 1250 mg via ORAL
  Filled 2023-05-16 (×5): qty 1

## 2023-05-16 MED ORDER — SUCCINYLCHOLINE CHLORIDE 200 MG/10ML IV SOSY
PREFILLED_SYRINGE | INTRAVENOUS | Status: DC | PRN
  Administered 2023-05-16: 100 mg via INTRAVENOUS

## 2023-05-16 MED ORDER — VANCOMYCIN HCL 1000 MG IV SOLR
INTRAVENOUS | Status: DC | PRN
  Administered 2023-05-16: 1000 mg

## 2023-05-16 MED ORDER — ARTIFICIAL TEARS OPHTHALMIC OINT
TOPICAL_OINTMENT | OPHTHALMIC | Status: AC
  Filled 2023-05-16: qty 3.5

## 2023-05-16 MED ORDER — TRANEXAMIC ACID-NACL 1000-0.7 MG/100ML-% IV SOLN
1000.0000 mg | INTRAVENOUS | Status: AC
  Administered 2023-05-16: 1000 mg via INTRAVENOUS
  Filled 2023-05-16: qty 100

## 2023-05-16 MED ORDER — LIDOCAINE 2% (20 MG/ML) 5 ML SYRINGE
INTRAMUSCULAR | Status: DC | PRN
  Administered 2023-05-16: 60 mg via INTRAVENOUS

## 2023-05-16 MED ORDER — HYDROMORPHONE HCL 1 MG/ML IJ SOLN
0.2500 mg | INTRAMUSCULAR | Status: DC | PRN
  Administered 2023-05-16 (×2): 0.25 mg via INTRAVENOUS
  Administered 2023-05-16: 0.5 mg via INTRAVENOUS

## 2023-05-16 MED ORDER — CHLORHEXIDINE GLUCONATE 0.12 % MT SOLN
15.0000 mL | Freq: Once | OROMUCOSAL | Status: AC
  Administered 2023-05-16: 15 mL via OROMUCOSAL
  Filled 2023-05-16: qty 15

## 2023-05-16 MED ORDER — ONDANSETRON HCL 4 MG/2ML IJ SOLN
INTRAMUSCULAR | Status: AC
  Filled 2023-05-16: qty 2

## 2023-05-16 MED ORDER — OXYCODONE HCL 5 MG PO TABS: 5.0000 mg | ORAL_TABLET | Freq: Once | ORAL | Status: DC | PRN

## 2023-05-16 MED ORDER — KETAMINE HCL 50 MG/5ML IJ SOSY
PREFILLED_SYRINGE | INTRAMUSCULAR | Status: AC
  Filled 2023-05-16: qty 5

## 2023-05-16 MED ORDER — SENNA 8.6 MG PO TABS
1.0000 | ORAL_TABLET | Freq: Two times a day (BID) | ORAL | Status: DC
  Administered 2023-05-16 – 2023-05-21 (×10): 8.6 mg via ORAL
  Filled 2023-05-16 (×10): qty 1

## 2023-05-16 MED ORDER — SUCCINYLCHOLINE CHLORIDE 200 MG/10ML IV SOSY
PREFILLED_SYRINGE | INTRAVENOUS | Status: AC
  Filled 2023-05-16: qty 10

## 2023-05-16 MED ORDER — PHENYLEPHRINE HCL-NACL 20-0.9 MG/250ML-% IV SOLN
INTRAVENOUS | Status: DC | PRN
  Administered 2023-05-16: 40 ug/min via INTRAVENOUS

## 2023-05-16 MED ORDER — OXYCODONE HCL 5 MG PO TABS
5.0000 mg | ORAL_TABLET | ORAL | Status: DC | PRN
  Administered 2023-05-16 – 2023-05-18 (×7): 10 mg via ORAL
  Filled 2023-05-16 (×7): qty 2

## 2023-05-16 MED ORDER — CALCIUM CARBONATE 1500 (600 CA) MG PO TABS: 600.0000 mg | ORAL_TABLET | Freq: Every morning | ORAL | Status: DC

## 2023-05-16 MED ORDER — PANTOPRAZOLE SODIUM 40 MG PO TBEC
80.0000 mg | DELAYED_RELEASE_TABLET | Freq: Every day | ORAL | Status: DC
  Administered 2023-05-16 – 2023-05-21 (×6): 80 mg via ORAL
  Filled 2023-05-16 (×7): qty 2

## 2023-05-16 MED ORDER — CEFAZOLIN SODIUM-DEXTROSE 2-4 GM/100ML-% IV SOLN
2.0000 g | INTRAVENOUS | Status: AC
  Administered 2023-05-16 (×2): 2 g via INTRAVENOUS
  Filled 2023-05-16: qty 100

## 2023-05-16 MED ORDER — LIDOCAINE 2% (20 MG/ML) 5 ML SYRINGE
INTRAMUSCULAR | Status: AC
  Filled 2023-05-16: qty 5

## 2023-05-16 MED ORDER — BUPIVACAINE-EPINEPHRINE 0.25% -1:200000 IJ SOLN
INTRAMUSCULAR | Status: DC | PRN
  Administered 2023-05-16: 30 mL

## 2023-05-16 MED ORDER — 0.9 % SODIUM CHLORIDE (POUR BTL) OPTIME
TOPICAL | Status: DC | PRN
  Administered 2023-05-16: 1000 mL

## 2023-05-16 MED ORDER — POLYETHYLENE GLYCOL 3350 17 G PO PACK
17.0000 g | PACK | Freq: Every day | ORAL | Status: DC
  Administered 2023-05-16 – 2023-05-21 (×5): 17 g via ORAL
  Filled 2023-05-16 (×6): qty 1

## 2023-05-16 MED ORDER — SODIUM CHLORIDE 0.9 % IV SOLN
INTRAVENOUS | Status: DC | PRN
Start: 2023-05-16 — End: 2023-05-16

## 2023-05-16 MED ORDER — TRANEXAMIC ACID-NACL 1000-0.7 MG/100ML-% IV SOLN
1000.0000 mg | Freq: Once | INTRAVENOUS | Status: AC
  Administered 2023-05-16: 1000 mg via INTRAVENOUS
  Filled 2023-05-16: qty 100

## 2023-05-16 MED ORDER — HYDROMORPHONE HCL 1 MG/ML IJ SOLN
INTRAMUSCULAR | Status: AC
  Filled 2023-05-16: qty 1

## 2023-05-16 MED ORDER — NEOSTIGMINE METHYLSULFATE 3 MG/3ML IV SOSY
PREFILLED_SYRINGE | INTRAVENOUS | Status: AC
  Filled 2023-05-16: qty 3

## 2023-05-16 MED ORDER — ONDANSETRON HCL 4 MG/2ML IJ SOLN
INTRAMUSCULAR | Status: DC | PRN
  Administered 2023-05-16: 4 mg via INTRAVENOUS

## 2023-05-16 MED ORDER — PHENYLEPHRINE 80 MCG/ML (10ML) SYRINGE FOR IV PUSH (FOR BLOOD PRESSURE SUPPORT)
PREFILLED_SYRINGE | INTRAVENOUS | Status: DC | PRN
  Administered 2023-05-16 (×2): 80 ug via INTRAVENOUS

## 2023-05-16 MED ORDER — ONDANSETRON HCL 4 MG/2ML IJ SOLN: 4.0000 mg | Freq: Four times a day (QID) | INTRAMUSCULAR | Status: DC | PRN

## 2023-05-16 MED ORDER — LISINOPRIL 20 MG PO TABS
20.0000 mg | ORAL_TABLET | Freq: Every day | ORAL | Status: DC
  Administered 2023-05-18: 20 mg via ORAL
  Filled 2023-05-16: qty 1

## 2023-05-16 MED ORDER — PROPOFOL 10 MG/ML IV BOLUS
INTRAVENOUS | Status: DC | PRN
  Administered 2023-05-16: 100 mg via INTRAVENOUS

## 2023-05-16 MED ORDER — HYDROMORPHONE HCL 1 MG/ML IJ SOLN
INTRAMUSCULAR | Status: DC | PRN
  Administered 2023-05-16: .5 mg via INTRAVENOUS

## 2023-05-16 MED ORDER — GLIPIZIDE 5 MG PO TABS
5.0000 mg | ORAL_TABLET | Freq: Every evening | ORAL | Status: DC
  Administered 2023-05-17 – 2023-05-20 (×4): 5 mg via ORAL
  Filled 2023-05-16 (×4): qty 1

## 2023-05-16 SURGICAL SUPPLY — 80 items
BENZOIN TINCTURE PRP APPL 2/3 (GAUZE/BANDAGES/DRESSINGS) ×1 IMPLANT
BONE FIBERS PLIAFX 10 (Bone Implant) ×1 IMPLANT
BUR NEURO DRILL SOFT 3.0X3.8M (BURR) ×1 IMPLANT
CANISTER SUCT 3000ML PPV (MISCELLANEOUS) ×1 IMPLANT
CLSR STERI-STRIP ANTIMIC 1/2X4 (GAUZE/BANDAGES/DRESSINGS) ×1 IMPLANT
CONN RELINE 5-6/5-6M 2H INLINE (Connector) ×1 IMPLANT
CONNECTOR RELN 5-6/5-6M 2 HL (Connector) IMPLANT
CORD BIPOLAR FORCEPS 12FT (ELECTRODE) IMPLANT
COVER SURGICAL LIGHT HANDLE (MISCELLANEOUS) ×1 IMPLANT
DERMABOND ADVANCED .7 DNX12 (GAUZE/BANDAGES/DRESSINGS) IMPLANT
DISSECTOR STICK (MISCELLANEOUS) IMPLANT
DRAIN HEMOVAC 7FR (DRAIN) ×2 IMPLANT
DRAPE C-ARM 42X72 X-RAY (DRAPES) ×1 IMPLANT
DRAPE C-ARMOR (DRAPES) IMPLANT
DRAPE MICROSCOPE LEICA 54X105 (DRAPES) ×1 IMPLANT
DRAPE UTILITY XL STRL (DRAPES) ×2 IMPLANT
DRESSING MEPILEX FLEX 4X4 (GAUZE/BANDAGES/DRESSINGS) ×1 IMPLANT
DRSG MEPILEX FLEX 4X4 (GAUZE/BANDAGES/DRESSINGS) ×1 IMPLANT
DRSG MEPILEX POST OP 4X8 (GAUZE/BANDAGES/DRESSINGS) IMPLANT
DRSG TEGADERM 4X10 (GAUZE/BANDAGES/DRESSINGS) ×1 IMPLANT
DRSG TEGADERM 4X4.75 (GAUZE/BANDAGES/DRESSINGS) IMPLANT
DURAPREP 26ML APPLICATOR (WOUND CARE) ×1 IMPLANT
ELECT BLADE 4.0 EZ CLEAN MEGAD (MISCELLANEOUS) ×1 IMPLANT
ELECT BLADE INSULATED 4IN (ELECTROSURGICAL) ×1 IMPLANT
ELECT COATED BLADE 2.86 ST (ELECTRODE) ×1 IMPLANT
ELECT NVM5 SURFACE MEP/EMG (ELECTRODE) IMPLANT
ELECT PENCIL ROCKER SW 15FT (MISCELLANEOUS) ×1 IMPLANT
ELECT REM PT RETURN 9FT ADLT (ELECTROSURGICAL) ×1 IMPLANT
ELECTRODE BLADE INSULATED 4IN (ELECTROSURGICAL) IMPLANT
ELECTRODE BLDE 4.0 EZ CLN MEGD (MISCELLANEOUS) IMPLANT
ELECTRODE REM PT RTRN 9FT ADLT (ELECTROSURGICAL) ×1 IMPLANT
EVACUATOR 1/8 PVC DRAIN (DRAIN) IMPLANT
GAUZE 4X4 16PLY ~~LOC~~+RFID DBL (SPONGE) IMPLANT
GAUZE SPONGE 4X4 12PLY STRL (GAUZE/BANDAGES/DRESSINGS) ×1 IMPLANT
GLOVE BIO SURGEON STRL SZ7.5 (GLOVE) ×1 IMPLANT
GLOVE BIOGEL PI IND STRL 7.5 (GLOVE) ×1 IMPLANT
GLOVE INDICATOR 8.0 STRL GRN (GLOVE) ×1 IMPLANT
GOWN STRL REUS W/ TWL XL LVL3 (GOWN DISPOSABLE) ×1 IMPLANT
GRAFT BNE FBR PLIAFX PRIME 10 (Bone Implant) IMPLANT
KIT BASIN OR (CUSTOM PROCEDURE TRAY) ×1 IMPLANT
KIT DILATOR XLIF 5 (KITS) IMPLANT
KIT POSITION SURG JACKSON T1 (MISCELLANEOUS) ×1 IMPLANT
KIT SURGICAL ACCESS MAXCESS 4 (KITS) IMPLANT
KIT TURNOVER KIT B (KITS) ×1 IMPLANT
MODULE EMG NDL SSEP NVM5 (NEUROSURGERY SUPPLIES) IMPLANT
MODULE EMG NEEDLE SSEP NVM5 (NEUROSURGERY SUPPLIES) ×1 IMPLANT
NDL 22X1.5 STRL (OR ONLY) (MISCELLANEOUS) ×1 IMPLANT
NEEDLE 22X1.5 STRL (OR ONLY) (MISCELLANEOUS) ×1 IMPLANT
NS IRRIG 1000ML POUR BTL (IV SOLUTION) ×1 IMPLANT
PACK LAMINECTOMY ORTHO (CUSTOM PROCEDURE TRAY) ×1 IMPLANT
PATTIES SURGICAL .5 X.5 (GAUZE/BANDAGES/DRESSINGS) IMPLANT
PUTTY BONE DBX 5CC MIX (Putty) IMPLANT
PUTTY DBM INSTAFILL CART 5CC (Putty) IMPLANT
ROD RELINE O-H CON M 5.0/6.0MM (Rod) IMPLANT
ROD RELINE TI LORD 5.5X70 (Rod) IMPLANT
ROD RELINE-O LORDOTIC 5.5X60MM (Rod) IMPLANT
SCREW LOCK RELINE 5.5 TULIP (Screw) IMPLANT
SCREW RELINE-O POLY 5.5X45MM (Screw) IMPLANT
SCREW RELINE-O POLY 6.5X45 (Screw) IMPLANT
SPACER RISE-L 18X50 8-15 6D (Spacer) IMPLANT
SPONGE SURGIFOAM ABS GEL 100 (HEMOSTASIS) ×1 IMPLANT
SPONGE T-LAP 18X18 ~~LOC~~+RFID (SPONGE) IMPLANT
SPONGE T-LAP 4X18 ~~LOC~~+RFID (SPONGE) ×1 IMPLANT
SUCTION TUBE FRAZIER 10FR DISP (SUCTIONS) ×1 IMPLANT
SUT BONE WAX W31G (SUTURE) ×1 IMPLANT
SUT ETHILON 2 0 FS 18 (SUTURE) IMPLANT
SUT MNCRL AB 3-0 PS2 27 (SUTURE) IMPLANT
SUT VIC AB 0 CT1 18XCR BRD8 (SUTURE) ×1 IMPLANT
SUT VIC AB 2-0 CT1 18 (SUTURE) ×1 IMPLANT
SUT VIC AB 2-0 CT2 18 VCP726D (SUTURE) IMPLANT
SUT VIC AB 3-0 PS2 18XBRD (SUTURE) ×1 IMPLANT
SYR 10ML LL (SYRINGE) IMPLANT
SYR BULB IRRIG 60ML STRL (SYRINGE) ×1 IMPLANT
SYR CONTROL 10ML LL (SYRINGE) ×1 IMPLANT
TIP CONICAL INSTAFILL (ORTHOPEDIC DISPOSABLE SUPPLIES) IMPLANT
TOWEL GREEN STERILE (TOWEL DISPOSABLE) ×1 IMPLANT
TOWEL GREEN STERILE FF (TOWEL DISPOSABLE) ×1 IMPLANT
TUBING FEATHERFLOW (TUBING) ×1 IMPLANT
WATER STERILE IRR 1000ML POUR (IV SOLUTION) ×1 IMPLANT
YANKAUER SUCT BULB TIP NO VENT (SUCTIONS) IMPLANT

## 2023-05-16 NOTE — Progress Notes (Addendum)
 Orthopedic Surgery Post-operative Progress Note  Assessment: Patient is a 73 y.o. female who is currently admitted after undergoing L2-4 XLIF and PSIF   Plan: -Operative plans complete -Needs upright films when able -Drains to be maintained until output slows -Out of bed as tolerated, no brace -No bending/lifting/twisting greater than 10 pounds -OT evaluate and treat -Pain control -Diabetic diet -No chemoprophylaxis for dvt or antiplatelets for 72 hours after surgery -Ancef  x2 post-operative doses -Disposition: to floor from PACU  ___________________________________________________________________________   Subjective: No acute events since surgery. Recovering in PACU. Experiencing significant pain but it is better than when she first work up from anesthesia.    Objective:  General: no acute distress, laying in bed Neurologic: drowsy, following commands Respiratory: unlabored breathing on nasal canula Skin: dressings clear/dry/intact, drains with serosanguinous output  MSK (spine):  -Strength exam      Right  Left  EHL    4/5  4/5 TA    5/5  5/5 GSC    5/5  5/5 Knee extension  5/5  5/5 Hip flexion   4/5  4/5  Hip flexion strength testing limited by pain  -Sensory exam    Sensation intact to light touch in L2-S1 nerve distributions of bilateral lower extremities (decreased distal to the knee bilaterally)   Patient name: Julia Knight Patient MRN: [id number] Date: 05/16/23

## 2023-05-16 NOTE — Brief Op Note (Signed)
 05/16/2023  2:32 PM  PATIENT:  Julia Knight  73 y.o. female  PRE-OPERATIVE DIAGNOSIS:  LUMBAR RADICULOPATHY  POST-OPERATIVE DIAGNOSIS:  LUMBAR RADICULOPATHY  PROCEDURE:  Procedure(s): LUMBAR TWO-LUMBAR THREE, LUMBAR THREE- LUMBAR FOUR LATERAL INTERBODY FUSION AND POSTERIOR LUMBAR FUSION TWO-FOUR LEVEL (N/A)  SURGEON:  Surgeons and Role:    DEWAINE Georgina [name], MD - Primary  PHYSICIAN ASSISTANT: none  ASSISTANTS: none   ANESTHESIA:   general  EBL:  300 mL   BLOOD ADMINISTERED:none  DRAINS:  2 medium hemovac drains in the back    LOCAL MEDICATIONS USED:  MARCAINE      SPECIMEN:  No Specimen  DISPOSITION OF SPECIMEN:  N/A  COUNTS:  YES  TOURNIQUET: NONE  DICTATION: .Note written in EPIC  PLAN OF CARE: Admit to inpatient   PATIENT DISPOSITION:  PACU - hemodynamically stable.   Delay start of Pharmacological VTE agent (>24hrs) due to surgical blood loss or risk of bleeding: yes

## 2023-05-16 NOTE — Op Note (Signed)
 Orthopedic Spine Surgery Operative Report  Procedure: L2/3 and L3/4 posterior lumbar spinal arthrodesis L2, L3 pedicle screw instrumentation with rods Application of demineralized bone matrix  Modifier: none  Date of procedure: 05/16/2023  Patient name: Julia Knight MRN: [id number] DOB: [birth year]  Surgeon: Ozell Ada, MD Assistant: None Pre-operative diagnosis: adjacent segment disease, lumbar radiculopathy Post-operative diagnosis: same as above Findings: degenerative discs at L2/3 and L3/4, hypertrophic facets at L2/3 and L3/4, laminectomy defects at L2/3 and L3/4  Specimens: none Anesthesia: general EBL: 250cc Complications: none Pre-incision antibiotic: ancef  TXA was given prior to incision as well  Implants:  Nuvasive Reline screws   -5.5x45 at L2 (x2)  -6.5x45 at L3 (x2) 5.5x70 titanium rod 5.5x65 titanium rod Demineralized bone matrix  Indication for procedure: See prior operative report which includes the details.  Procedure Description: The patient was already in the operating room at this point. Neuromonitoring leads had been attached and baseline signals had been obtained. Patient was in the prone position with all bony prominences well padded and her eyes free of compression. Time out had already been performed that confirmed the patient identity, planned procedure, and operative levels. No repeat time out was performed.   A midline incision in line with her prior incision was made. It was taken sharply down through skin and dermis. Electrocautery was then used to continue the dissection in the plane of the prior scar tissue. The left sided screws were palpable within the wound. Electrocautery was used to expose the screws and the rod on the left side. Electrocautery was then used to continue the dissection on the right side. The screws were found over there as well. Electrocautery was used to clear the soft tissue off the screws and rods on that  side. Next, electrocautery was used to expose the remaining lamina at L2 and L3. The L1/2 facet joint was exposed bilaterally and care was taken to preserve the joint capsules. Then, electrocautery was used to expose the L2/3 facet capsules. The capsules were removed off of these joints bilaterally. Electrocautery was then used to dissect lateral the facets to find the transverse process at L2, L3, L4 bilaterally. Electrocautery was used to clear the soft tissue off these transverse processes.   Next, the pedicle screws were placed. The starting points for the pedicle screws were identified using anatomical landmarks. Fluoroscopy was brought in in the AP view to confirm the start points. Each pedicle screw was placed under AP fluoroscopy. A true AP was obtained of the vertebra where screw was being placed. Then, a pilot hole was made with a 3mm matchstick burr at the start point. A pedicle finder was advanced into the pilot hole. AP fluoroscopy was used to confirm proper starting point. A image was taken when the pedicle finder had been advanced 10mm, 15mm, and at 20mm. The pedicle finder remained within the pedicle and did not breach medially at any of those markers. The pedicle finder was then advanced to 35mm and then withdrawn. A feeler was placed into the hole and confirmed no pedicle wall breaches. It was advanced to the ventral cortex by palpation. The screw length was then estimated off of the depth of the feeler. The widths of the screws had been predetermined as they were measured pre-operatively on the patient's advanced imaging. That measured length and pre-determined width screw was then inserted under AP fluoroscopic guidance. The screw was advanced approximately 10mm, 15mm, and then 20mm. At each one of those intervals an image was taken  to confirm that the screw was following the trajectory of the pedicle and had not breached medially. The screw was then advanced fully until there was good  purchase. This process was repeated for every pedicle screw.     The screws that were inserted were NuVasive Reline:               Left                  Right L2 5.5x45 (>30) 5.5x45 (>30) L3 6.5x45 (>30) 6.5x45 (>30)   AP and lateral fluoroscopic images were then taken to visualize all the screws. The screws were in satisfactory position. The screws were then stimulated and each stimulated greater than 30.   The set caps of the L4 and L5 screws that had been previously placed were removed. The rod was moved over and lateral to the L4 screws. Both sides L4 screws were then removed. A side-to-side connector was attached on the left-side. A 70mm titanium 5.24mm rod was inserted into the L2 and L3 screws and into the connector. Set caps were placed into the L2 and L3 screws and into the connector. The set caps were tightened down. The L5 set cap from the other system was placed back into its screw and tightened into place. On the right side, an end-to-end connector was used. This was placed over the old rod. A new 5.5x75mm titanium rod was placed into the L2 and L3 pedicle screws and into the end-to-end connector. Set caps were placed into the L2 and L3 screws and tightened into place. The connector's screws were also tightened. The L5 set cap was placed back into the other system's screw.   A high speed burr was used to decorticate the L2/3 and L3/4 facet joints and the transverse processes at L2, L3, L4 bilaterally. Demineralized bone matrix was placed over the decorticated bone. Final AP and lateral fluoroscopic images were obtained which showed satisfactory position of the interbody devices and the posterior instrumentation. All set caps including the other system's set caps were final tightened.   1g of vancomycin  powder was placed into the wound. A medium hemovac drain was placed deep to the fascia. The fascia was reapproximated with 0 vicryl suture. A final neuromonitoring check was performed and  there was no change from baseline. Neuromonitoring was discontinued at this time. A subcutaneous hemovac drain was placed above the fascia. The subcutaneous fat was reapproximated with 0 vicryl suture. The deep dermal layer was reapproximated with 2-0 vicryl. The skin as closed with a 3-0 running moncryl. All counts were correct at the end of the case. The incision was dressed with steri strips and benzoine. An island dressing was placed over the wound. The patient was transferred back to a bed and brought to the post-anesthesia care unit by anesthesia staff in stable condition.  Post-operative plan: The patient will recover in the post-anesthesia care unit and then go to the floor. The patient will receive two post-operative doses of ancef . She will get another dose of TXA. The patient will be out of bed as tolerated with no brace. The patient will work with physical therapy. The drains will be removed on the day of discharge. The patient will likely discharge to home in a few days time.       Ozell Ada, MD Orthopedic Surgeon

## 2023-05-16 NOTE — Op Note (Signed)
 Orthopedic Spine Surgery Operative Report  Procedure: L2/3 and L3/4 lateral lumbar interbody arthrodesis Insertion of biomechanical interbody device at L2/3 and L3/4 Application of demineralized bone matrix  Modifier: none  Date of procedure: 05/16/2023  Patient name: Kerah Hardebeck MRN: [id number] DOB: [birth year]  Surgeon: Ozell Ada, MD Assistant: None Pre-operative diagnosis: adjacent segment disease, lumbar radiculopathy Post-operative diagnosis: same as above Findings: degenerative discs at L2/3 and L3/4  Specimens: none Anesthesia: general EBL: 50cc Complications: none Pre-incision antibiotic: ancef  TXA was given prior to incision as well  Implants:  8x8x50 Globus expandable lateral interbody cage (x2) Demineralized bone matrix   Indication for procedure: Patient is a 73 y.o. female who presented to the office with symptoms consistent with lumbar radiculopathy. The patient had tried conservative treatments that did not provide any lasting relief. As result, operative management was discussed. 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 In order to address the symptoms, lateral lumbar interbody arthrodesis at L2/3 and L3/4 was discussed as a part of her surgical treatment option. The risks including but not limited to dural tear, nerve root injury, paralysis, pseudarthrosis, persistent pain, infection, bleeding, hardware failure, adjacent segment disease, vascular injury, psoas weakness, lumbar plexus injury, heart attack, death, fracture, and need for additional procedures were discussed with the patient. Since this was planned to be an indirect decompression, there is a risk of persistent symptoms which the patient understood after explaining the reason. The benefit of the surgery would be relief of the patient's radiating leg pain. The alternatives to surgical management were covered with the patient and included continued monitoring,  physical therapy, over-the-counter pain medications, ambulatory aids, injections, gabapentin /lyrica , and activity modification. All the patient's questions were answered to her satisfaction. After this discussion, the patient expressed understanding and elected to proceed with surgical intervention.  Procedure Description: The patient was met in the pre-operative holding area. The patient's identity and consent were verified. The operative site was marked. The patient's remaining questions about the surgery were answered. The patient was brought back to the operating room. General anesthesia was induced and an endotracheal tube was placed by the anesthesia staff. The patient was transferred to the prone [place] table in the prone position. All bony prominences were well padded. The head of the bed was slightly elevated and the eyes were free from compression by the face pillow. The surgical area was cleansed with alcohol. Neuromonitoring leads were attached to the patient by the neuromonitoring technologist. Baseline neuromonitoring signals were obtained. Fluoroscopy was then brought in to check rotation on the AP image and to mark the anterior, posterior 1/3, and posterior aspects of the L2/3 and L3/4 disc spaces on the lateral image. The patient's skin was then prepped and draped in a standard, sterile fashion. Both the area over the left lateral abdominal wall and the posterior lumbar region were prepped and draped. A time out was performed that identified the patient, the procedure, and the operative levels. All team members agreed with what was stated in the time out.  An oblique incision over the abdominal wall was made connecting the two previously marked out disc spaces. Incision was taken sharply down through skin and dermis. Electrocautery was used to continue the dissection through the subcutaneous tissue and fat to the level of the fascia overlying the external oblique muscle. Electrocautery was used  to make an incision through the fascia in line with the muscle fibers. Metzenbaum scissors were used to dissect in line with the muscle fibers  of the external oblique, the internal oblique, and then the transversus abdominis muscles. Metzenbaum scissors were used to open the transversalis fascia. A sponge stick and long kidner were utilized to sweep the retroperitoneal fat ventral to eventually visualize the psoas muscle.   The initial dilator was placed on the psoas fibers. An AP and lateral fluoroscopic image was taken to confirm dilator was at correct level and in proper position. The dilator was stimulated at L3/4 and all readings when stimulated were greater than 20. The dilator was then rotated to bluntly advance it through the psoas muscle. The position of the dilator was checked and adjusted under fluoroscopy until it was in the proper position near the posterior 1/3 of the disc space. The dilator was again stimulated and all readings were again over 20. A K wire was advanced through the dilator into the L3/4 disc space to secure the dilator's position. Sequential dilators were placed over the initial dilator and rotated to bluntly dissect to the level of the disc space. Each time a new dilator was placed, it was stimulated in all directions and no readings were under 15. The lowest reading was 15 with the largest dilator. The retractor was then advanced over the dilators to the level of the disc space. Fluoroscopy was used to adjust and align the retractor to be parallel to the disc space. The retractor was locked into place using the bed attachment. The position of the retractor was checked again on AP and lateral fluoroscopy. The retractor was in a satisfactory position. The stimulator probe was used to test in all directions, including under the posterior retractor blade. The area where the posterior shim was going to go stimulated at 13. Every other direction it read greater than 20. The shim was  placed and advanced down the retractor into the disc space. The dilators and K wire were removed. The cranial/caudal aspect of the retractor was opened and the anterior blade of the retractor was advanced ventrally by six clicks. A bipolar and long kidner were used to remove the remaining soft tissue off of the disc space. A rectangular annulotomy was made in the disc space. A pituitary was used to remove some of the disc. A Cobb was placed into the disc space and advanced along the endplate to clear cartilaginous and disc material off of the endplate under fluoroscopic guidance. The Cobb was impacted to advance it through the annulus on the contralateral side. The Cobb was rotated and the other end plate was prepared in the same fashion. More disc was removed with a pituitary. Then, a combination of ringer curettes, straight curettes, curved curettes, and pituitaries were used to remove the remaining disc until the endplates were well prepared with cartilaginous and disc material removed off of them. A series of trials were advanced across the disc space under AP fluoroscopic guidance until a good fit was obtained. This occurred with a 8mm trial. Based off the trial it looked like a 50 length cage would be the most appropriate. Neuromonitoring signals were checked and were unchanged from baseline. An expandable 50mm biomechanical interbody device was selected for implantation (see implant section above for specifics). Demineralized bone matrix was packed into interbody device. The interbody device was placed into the disc space and advanced under fluoroscopic guidance until it was across the disc space. AP and lateral fluoroscopic images were taken and confirmed satisfactory position of the implant. Neuromonitoring signals were checked and were unchanged from baseline. The implant was expanded  until disc height appeared restored on the lateral image. The cage was back filled with demineralized bone matrix. The  retractor blades were then closed for a total retractor time of 23 minutes. The retractor was then slowly removed and any bleeding was coagulated with a bipolar. AP and lateral fluoroscopic images were taken and confirmed satisfactory position of the implant.   Next, the psoas muscle was again visualized with the help of a Wiley vein retractor and kidner at the L2/3 disc space. The initial dilator was placed on the psoas fibers. An AP and lateral fluoroscopic image was taken to confirm dilator was at correct level and in proper position. The dilator was stimulated at L2/3 and all readings when stimulated were greater than 20. The dilator was then rotated to bluntly advance it through the psoas muscle. The position of the dilator was checked and adjusted under fluoroscopy until it was in the proper position near the posterior 1/3 of the disc space. The dilator was again stimulated and all readings were again over 20. A K wire was advanced through the dilator into the L2/3 disc space to secure the dilator's position. Sequential dilators were placed over the initial dilator and rotated to bluntly dissect to the level of the disc space. Each time a new dilator was placed, it was stimulated in all directions and no readings were under 20 even with the largest dilator. The retractor was then advanced over the dilators to the level of the disc space. Fluoroscopy was used to adjust and align the retractor to be parallel to the disc space. The retractor was locked into place using the bed attachment. The position of the retractor was checked again on AP and lateral fluoroscopy. The retractor was in a satisfactory position. The stimulator probe was used to test in all directions, including under the posterior retractor blade. The area where the posterior shim was going to go stimulated at 20. Every other direction it all stimulated at 20. The shim was placed and advanced down the retractor into the disc space. The dilators  and K wire were removed. The cranial/caudal aspect of the retractor was opened and the anterior blade of the retractor was advanced ventrally by six clicks. A bipolar and long kidner were used to remove the remaining soft tissue off of the disc space. A rectangular annulotomy was made in the disc space. A pituitary was used to remove some of the disc. A Cobb was placed into the disc space and advanced along the endplate to clear cartilaginous and disc material off of the endplate under fluoroscopic guidance. The Cobb was impacted to advance it through the annulus on the contralateral side. The Cobb was rotated and the other end plate was prepared in the same fashion. More disc was removed with a pituitary. Then, a combination of ringer curettes, straight curettes, curved curettes, and pituitaries were used to remove the remaining disc until the endplates were well prepared with cartilaginous and disc material removed off of them. A series of trials were advanced across the disc space under AP fluoroscopic guidance until a good fit was obtained. This occurred with a 8mm trial. Based off the trial it looked like a 50 length cage would be the most appropriate. Neuromonitoring signals were checked and were unchanged from baseline. An expandable 50mm biomechanical interbody device was selected for implantation (see implant section above for specifics). Demineralized bone matrix was packed into interbody device. The interbody device was placed into the disc space and advanced  under fluoroscopic guidance until it was across the disc space. AP and lateral fluoroscopic images were taken and confirmed satisfactory position of the implant. Neuromonitoring signals were checked and were unchanged from baseline. The implant was expanded until disc height appeared restored on the lateral image. The cage was back filled with demineralized bone matrix. The retractor blades were then closed for a total retractor time of 20 minutes.  The retractor was then slowly removed and any bleeding was coagulated with a bipolar. AP and lateral fluoroscopic images were taken and confirmed satisfactory position of the implant.   The transversalis fascia was reapproximated with 0 vicryl. The internal and external oblique muscular layers were reapproximated with 0 vicryl. The fascia overlying the external oblique muscle was reapproximated with 0 vicryl. The deep dermal layer was reapproximated with 2-0 vicryl. The skin was closed with 3-0 monocryl. The incision was dressed with dermabond and an island dressing.   Post-operative plan: The patient remained intubated and in the operating room after this procedure as there was plan for further posterior surgery. The drapes were left in place since the posterior spine was draped in at the beginning of this procedure. The posterior portion of the case was completed in the same trip to the operating room and will be dictated separately.    Ozell Ada, MD Orthopedic Surgeon

## 2023-05-16 NOTE — H&P (Addendum)
 Orthopedic Spine Surgery H&P Note  Assessment: Patient is a 73 y.o. female with lumbar radiculopathy   Plan: -Out of bed as tolerated, activity as tolerated, no brace -Covered the risks of surgery one more time with the patient and patient elected to proceed with planned surgery -Written consent verified -Hold anticoagulation in anticipation of surgery -Ancef  and TXA on all to OR -NPO for procedure -Site marked -To OR when ready   The risks of surgery were again covered this morning. Those risks included but were not limited to dural tear, nerve root injury, paralysis, pseudarthrosis, persistent pain, infection, bleeding, hardware failure/malposition, adjacent segment disease, vascular injury, psoas weakness, lumbar plexus injury, heart attack, death, fracture, dvt/pe, and need for additional procedures were discussed with the patient. Since this will be an indirect decompression, there is a risk of persistent symptoms which I went over with her again. She expressed understanding. I told her that her risks would be higher particularly durotomy and infection since the posterior portion of the procedure will be a revision. After this discussion, the patient expressed understanding and elected to proceed with surgical intervention.  ___________________________________________________________________________  Chief Complaint: low back pain that radiates into bilateral thighs   History: Patient is 73 y.o. female who has been previously seen in the office for low back pain that radiated into her bilateral lower extremities. Pain goes into anterolateral thighs bilaterally. 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 I had explained that some of her symptoms seemed more consistent with neuropathy especially distal to the knee due to her previously poorly controlled diabetes. Her radicular symptoms failed to improve with conservative treatment so  operative management was discussed at the last office visit. The patient presents today with no changes in her symptoms since the last office visit. See previous office note for further details.    Review of systems: General: denies fevers and chills, myalgias Neurologic: denies recent changes in vision, slurred speech Abdomen: denies nausea, vomiting, hematemesis Respiratory: denies cough, shortness of breath  Past medical history:  Hyperlipidemia Hypertension GERD Anxiety Diabetes (last A1c was 7.5 on 04/26/2023) Chronic pain   Allergies: NKDA   Past surgical history:  L2-S1 laminectomies and L4-S1 TLIF's Appendectomy Tonsillectomy Laparoscopic right hemicolectomy   Social history: Reports use of nicotine product (smoking, vaping, patches, smokeless) Alcohol use: denies Denies recreational drug use  Family history: -reviewed and not pertinent to lumbar foraminal stenosis   Physical Exam:  BMI of 32.7  General: no acute distress, appears stated age Neurologic: alert, answering questions appropriately, following commands Cardiovascular: regular rate, no cyanosis Respiratory: unlabored breathing on room air, symmetric chest rise Psychiatric: appropriate affect, normal cadence to speech   MSK (spine):  -Strength exam      Left  Right  EHL    4/5  4/5 TA    5/5  5/5 GSC    5/5  5/5 Knee extension  5/5  5/5 Knee flexion   5/5  5/5 Hip flexion   4+/5  4+/5  -Sensory exam    Sensation intact to light touch in L2-S1 nerve distributions of bilateral lower extremities (decreased distal to the knees bilaterally)   DEXA scan showed T score of -2.1. Has been treated with Vitamin D  and calcium  by her PCP.  Patient name: Julia Knight Patient MRN: [id number] Date: 05/16/23  Pre-operative Scores  ODI: 62 VAS back: 10/10  VAS leg: 10/10

## 2023-05-16 NOTE — Transfer of Care (Signed)
 Immediate Anesthesia Transfer of Care Note  Patient: Julia Knight  Procedure(s) Performed: LUMBAR TWO-LUMBAR THREE, LUMBAR THREE- LUMBAR FOUR LATERAL INTERBODY FUSION AND POSTERIOR LUMBAR FUSION TWO-FOUR LEVEL (Spine Lumbar)  Patient Location: PACU  Anesthesia Type:General  Level of Consciousness: sedated  Airway & Oxygen Therapy: Patient Spontanous Breathing  Post-op Assessment: Report given to RN  Post vital signs: Reviewed and stable  Last Vitals:  Vitals Value Taken Time  BP 140/53 05/16/23 1415  Temp    Pulse 112 05/16/23 1418  Resp 15 05/16/23 1418  SpO2 100 % 05/16/23 1418  Vitals shown include unfiled device data.  Last Pain:  Vitals:   05/16/23 0640  PainSc: 9          Complications: No notable events documented.

## 2023-05-16 NOTE — Anesthesia Postprocedure Evaluation (Signed)
 Anesthesia Post Note  Patient: Larene Zamarripa Valenzuela  Procedure(s) Performed: LUMBAR TWO-LUMBAR THREE, LUMBAR THREE- LUMBAR FOUR LATERAL INTERBODY FUSION AND POSTERIOR LUMBAR FUSION TWO-FOUR LEVEL (Spine Lumbar)     Patient location during evaluation: PACU Anesthesia Type: General Level of consciousness: sedated, patient cooperative and oriented Pain management: pain level controlled Vital Signs Assessment: post-procedure vital signs reviewed and stable Respiratory status: spontaneous breathing, nonlabored ventilation, respiratory function stable and patient connected to nasal cannula oxygen Cardiovascular status: blood pressure returned to baseline and stable Postop Assessment: no apparent nausea or vomiting Anesthetic complications: no   No notable events documented.  Last Vitals:  Vitals:   05/16/23 1600 05/16/23 1712  BP: 107/83 138/77  Pulse: 94 92  Resp: (!) 8 16  Temp:  36.5 C  SpO2: 98% 98%    Last Pain:  Vitals:   05/16/23 1600  PainSc: 6                  Ezell Melikian,E. Shemeika Starzyk

## 2023-05-17 ENCOUNTER — Encounter (HOSPITAL_BASED_OUTPATIENT_CLINIC_OR_DEPARTMENT_OTHER): Payer: Self-pay | Admitting: *Deleted

## 2023-05-17 LAB — CBC
HCT: 26.2 % — ABNORMAL LOW (ref 36.0–46.0)
Hemoglobin: 8.7 g/dL — ABNORMAL LOW (ref 12.0–15.0)
MCH: 28.4 pg (ref 26.0–34.0)
MCHC: 33.2 g/dL (ref 30.0–36.0)
MCV: 85.6 fL (ref 80.0–100.0)
Platelets: 230 10*3/uL (ref 150–400)
RBC: 3.06 MIL/uL — ABNORMAL LOW (ref 3.87–5.11)
RDW: 13.6 % (ref 11.5–15.5)
WBC: 15.8 10*3/uL — ABNORMAL HIGH (ref 4.0–10.5)
nRBC: 0 % (ref 0.0–0.2)

## 2023-05-17 LAB — BASIC METABOLIC PANEL
Anion gap: 9 (ref 5–15)
BUN: 15 mg/dL (ref 8–23)
CO2: 22 mmol/L (ref 22–32)
Calcium: 9.1 mg/dL (ref 8.9–10.3)
Chloride: 104 mmol/L (ref 98–111)
Creatinine, Ser: 1.08 mg/dL — ABNORMAL HIGH (ref 0.44–1.00)
GFR, Estimated: 55 mL/min — ABNORMAL LOW (ref >60)
Glucose, Bld: 138 mg/dL — ABNORMAL HIGH (ref 70–99)
Potassium: 4.6 mmol/L (ref 3.5–5.1)
Sodium: 135 mmol/L (ref 135–145)

## 2023-05-17 LAB — GLUCOSE, CAPILLARY
Glucose-Capillary: 132 mg/dL — ABNORMAL HIGH (ref 70–99)
Glucose-Capillary: 161 mg/dL — ABNORMAL HIGH (ref 70–99)
Glucose-Capillary: 191 mg/dL — ABNORMAL HIGH (ref 70–99)
Glucose-Capillary: 184 mg/dL — ABNORMAL HIGH (ref 70–99)

## 2023-05-17 NOTE — Plan of Care (Signed)

## 2023-05-17 NOTE — Progress Notes (Addendum)
 Orthopedic Surgery Post-operative Progress Note  Assessment: Patient is a 73 y.o. female who is currently admitted after undergoing L2-4 XLIF and PSIF   Plan: -Operative plans complete -Needs upright films when able -Drains to be maintained until output slows -Out of bed as tolerated, no brace -No bending/lifting/twisting greater than 10 pounds -OT evaluate and treat -Pain control -Diabetic diet -No chemoprophylaxis for dvt or antiplatelets for 72 hours after surgery -Ancef  x2 post-operative doses -Disposition: remain floor status  Acute anemia from surgical blood loss -Hemoglobin was 11.0 pre-operatively and is 8.7 this morning -Patient has no signs or symptoms of anemia and no further significant blood loss expected, will continue to monitor  ___________________________________________________________________________   Subjective: No acute events overnight. Having back pain but it is well controlled with current medications. Leg pain has improved since surgery. Has some left hip pain and pain with hip flexion.    Objective:  General: no acute distress, laying in bed Neurologic: awake, alert, following commands Respiratory: unlabored breathing on room air Skin: dressings clear/dry/intact, drains with serosanguinous output  MSK (spine):  -Strength exam      Right  Left  EHL    4/5  4/5 TA    5/5  5/5 GSC    5/5  5/5 Knee extension  5/5  5/5 Hip flexion   5/5  4/5  Hip flexion strength testing limited by pain on the left  -Sensory exam    Sensation intact to light touch in L2-S1 nerve distributions of bilateral lower extremities (decreased distal to the knee bilaterally)   Patient name: Julia Knight Patient MRN: [id number] Date: 05/17/23

## 2023-05-17 NOTE — Discharge Summary (Signed)
 Orthopedic Surgery Discharge Summary  Patient name: Julia Knight Patient MRN: [id number] Admit today: 05/16/2023 Discharge date: 05/21/2023  Attending physician: Ozell Ada, MD Final diagnosis: adjacent segment disease, lumbar radiculopathy Findings: degenerative discs at L2/3 and L3/4, hypertrophic facets at L2/3 and L3/4, laminectomy defects at L2/3 and L3/4   Hospital course: Patient is a 73 y.o. female who was admitted after undergoing L2-4 lateral interbody fusion and posterior instrumented spinal fusion. The patient had significant pain immediately after surgery, but pain eventually was controlled with a multimodal regimen including oxycodone . Labs during the hospitalization revealed anemia from surgical blood loss that eventually stabilized without any intervention. She also developed an AKI on POD#4 that resolved with fluid administration. The patient worked with physical therapy who recommended discharge to home. The patient was tolerating an oral diet without issue and was voiding spontaneously after surgery. The patient's vitals were stable on the day of discharge. The patient's drains were removed on the day of discharge. The patient was medically ready for discharge and was discharge to home on post-operative day five.  Instructions:   Orthopedic Surgery Discharge Instructions  Patient name: Julia Knight Procedure Performed: L2-4 lateral interbody fusion, L2-4 posterior instrumented spinal fusion Date of Surgery: 05/16/2023 Surgeon: Ozell Ada, MD  Pre-operative Diagnosis: lumbar radiculopathy Post-operative Diagnosis: same as above  Discharged to: home Discharge Condition: stable  Activity: You should refrain from bending, lifting, or twisting with objects greater than ten pounds until three months after surgery. You are encouraged to walk as much as desired. You can perform household activities such as cleaning dishes, doing laundry,  vacuuming, etc. as long as the ten-pound restriction is followed. You do not need to wear a brace during the post-operative period.   Incision Care: Your incisions site have a dressings over them. That dressings should remain in place and dry at all times for a total of one week after surgery. After one week, you can remove the dressing. Underneath the back dressing, you will find pieces of tape. Underneath the abdominal or side incision dressing, you will see skin glue. You should leave these pieces of tape and the skin glue in place. They will both fall off with time. Do not pick, rub, or scrub at either of them. Do not put cream or lotion over the surgical area. After one week and once the dressing is off, it is okay to let soap and water run over your incision. Again, do not pick, scrub, or rub at the pieces of tape when bathing. Do not submerge (e.g., take a bath, swim, go in a hot tub, etc.) until six weeks after surgery. There may be some bloody drainage from the incision into the dressing after surgery. This is normal. You do not need to replace the dressing. Continue to leave it in place for the one week as instructed above. Should the dressing become saturated with blood or drainage, please call the office for further instructions.   Medications: You have been prescribed oxycodone . This is a narcotic pain medication and should only be taken as prescribed. You should not drink alcohol or operate heavy machinery (including driving) while taking this medication. The oxycodone  can cause constipation as a side effect. For that reason, you have been prescribed senna and miralax . These are both laxatives. You do not need to take this medication if you develop diarrhea. Should you remain constipated even while taking these medications, please increase the dose of miralax  to twice daily. Tylenol  has been prescribed  to be taken every 8 hours, which will give you additional pain relief. Flexeril  is a muscle  relaxer that has been prescribed to you for muscle spasm type pain. Take this medication as needed.   Do not take NSAIDs (ibuprofen, Aleve, Celebrex , naproxen, meloxicam , etc.) for the first 6 weeks after surgery as there is some evidence that their use may decrease the chances of successful fusion.  In order to set expectations for opioid prescriptions, you will only be prescribed opioids for a total of six weeks after surgery and, at two-weeks after surgery, your opioid prescription will start to tapered (decreased dosage and number of pills). If you have ongoing need for opioid medication six weeks after surgery, you will be referred to pain management. If you are already established with a provider that is giving you opioid medications, you should schedule an appointment with them for six weeks after surgery if you feel you are going to need another prescription. State law only allows for opioid prescriptions one week at a time. If you are running out of opioid medication near the end of the week, please call the office during business hours before running out so I can send you another prescription.   You may resume any home blood thinners (warfarin, lovenox, apixaban, plavix, xarelto, etc) 72 hours after your surgery. Take these medications as they were previously prescribed.  Driving: You should not drive while taking narcotic pain medications. You should start getting back to driving slowly and you may want to try driving in a parking lot before doing anything more.   Diet: You are safe to resume your regular diet after surgery.   Reasons to Call the Office After Surgery: You should feel free to call the office with any concerns or questions you have in the post-operative period, but you should definitely notify the office if you develop: -shortness of breath, chest pain, or trouble breathing -excessive bleeding, drainage, redness, or swelling around the surgical site -fevers, chills, or pain  that is getting worse with each passing day -persistent nausea or vomiting -new weakness in either leg -new or worsening numbness or tingling in either leg -numbness in the groin, bowel or bladder incontinence -other concerns about your surgery  Follow Up Appointments: You have an appointment with Dr. Georgina on 05/29/2023 at 9am. Please arrive on time to this appointment.    Office Information:  -Ozell Georgina, MD -Phone number: [phone] -Address: [address]       [place], [place] [address]

## 2023-05-17 NOTE — Discharge Instructions (Addendum)
 Orthopedic Surgery Discharge Instructions  Patient name: Julia Knight Procedure Performed: L2-4 lateral interbody fusion, L2-4 posterior instrumented spinal fusion Date of Surgery: 05/16/2023 Surgeon: Ozell Ada, MD  Pre-operative Diagnosis: lumbar radiculopathy Post-operative Diagnosis: same as above  Discharged to: home Discharge Condition: stable  Activity: You should refrain from bending, lifting, or twisting with objects greater than ten pounds until three months after surgery. You are encouraged to walk as much as desired. You can perform household activities such as cleaning dishes, doing laundry, vacuuming, etc. as long as the ten-pound restriction is followed. You do not need to wear a brace during the post-operative period.   Incision Care: Your incisions site have a dressings over them. That dressings should remain in place and dry at all times for a total of one week after surgery. After one week, you can remove the dressing. Underneath the back dressing, you will find pieces of tape. Underneath the abdominal or side incision dressing, you will see skin glue. You should leave these pieces of tape and the skin glue in place. They will both fall off with time. Do not pick, rub, or scrub at either of them. Do not put cream or lotion over the surgical area. After one week and once the dressing is off, it is okay to let soap and water run over your incision. Again, do not pick, scrub, or rub at the pieces of tape when bathing. Do not submerge (e.g., take a bath, swim, go in a hot tub, etc.) until six weeks after surgery. There may be some bloody drainage from the incision into the dressing after surgery. This is normal. You do not need to replace the dressing. Continue to leave it in place for the one week as instructed above. Should the dressing become saturated with blood or drainage, please call the office for further instructions.   Medications: You have been prescribed  oxycodone . This is a narcotic pain medication and should only be taken as prescribed. You should not drink alcohol or operate heavy machinery (including driving) while taking this medication. The oxycodone  can cause constipation as a side effect. For that reason, you have been prescribed senna and miralax . These are both laxatives. You do not need to take this medication if you develop diarrhea. Should you remain constipated even while taking these medications, please increase the dose of miralax  to twice daily. Tylenol  has been prescribed to be taken every 8 hours, which will give you additional pain relief. Flexeril  is a muscle relaxer that has been prescribed to you for muscle spasm type pain. Take this medication as needed. You should stop your home blood pressure medication since your blood pressure was low in the hospital. Dr. Ada will instruct you when to restart that medication. You also have been prescribed a medication called midodrine  to increase your blood pressure. You will take this for the next 10 days. It is a three times per day medication.   Do not take NSAIDs (ibuprofen, Aleve, Celebrex , naproxen, meloxicam , etc.) for the first 6 weeks after surgery as there is some evidence that their use may decrease the chances of successful fusion.  In order to set expectations for opioid prescriptions, you will only be prescribed opioids for a total of six weeks after surgery and, at two-weeks after surgery, your opioid prescription will start to tapered (decreased dosage and number of pills). If you have ongoing need for opioid medication six weeks after surgery, you will be referred to pain management. If you  are already established with a provider that is giving you opioid medications, you should schedule an appointment with them for six weeks after surgery if you feel you are going to need another prescription. State law only allows for opioid prescriptions one week at a time. If you are running out  of opioid medication near the end of the week, please call the office during business hours before running out so I can send you another prescription.   You may resume any home blood thinners (warfarin, lovenox, apixaban, plavix, xarelto, etc) 72 hours after your surgery. Take these medications as they were previously prescribed.  Driving: You should not drive while taking narcotic pain medications. You should start getting back to driving slowly and you may want to try driving in a parking lot before doing anything more.   Diet: You are safe to resume your regular diet after surgery.   Reasons to Call the Office After Surgery: You should feel free to call the office with any concerns or questions you have in the post-operative period, but you should definitely notify the office if you develop: -shortness of breath, chest pain, or trouble breathing -excessive bleeding, drainage, redness, or swelling around the surgical site -fevers, chills, or pain that is getting worse with each passing day -persistent nausea or vomiting -new weakness in either leg -new or worsening numbness or tingling in either leg -numbness in the groin, bowel or bladder incontinence -other concerns about your surgery  Follow Up Appointments: You have an appointment with Dr. Georgina on 05/29/2023 at 9am. Please arrive on time to this appointment.    Office Information:  -Ozell Georgina, MD -Phone number: [phone] -Address: [address]       [place], [place] [address]

## 2023-05-17 NOTE — Evaluation (Signed)
 Occupational Therapy Evaluation Patient Details Name: Julia Knight MRN: [id number] DOB: 10-Jan-1951 Today's Date: 05/17/2023   History of Present Illness Pt is a 73 year old woman admitted on 05/15/22 for L 2-4 XLIF and PSIF. PMH: colon ca, HTN, HLD, GERD, NIDDM, back sx.   Clinical Impression   Pt was walking with a cane and independent in self care. Pt plans to discharge to her daughter's home. She owns a RW, reacher, cane and sock aid and has misplaced her BSC. Pt presents with 8/10 back pain. Requires moderate assist for bed mobility, up to min assist to stand and CGA to ambulate with RW to bathroom. Pt requires set up to max assist for ADLs in the absence of AE. Educated pt and family in back precautions and reinforced with Spanish written handout. Interpreter Julia Knight #[id number] utillzed.       If plan is discharge home, recommend the following: A little help with walking and/or transfers;A lot of help with bathing/dressing/bathroom;Assistance with cooking/housework;Assist for transportation;Help with stairs or ramp for entrance    Functional Status Assessment  Patient has had a recent decline in their functional status and demonstrates the ability to make significant improvements in function in a reasonable and predictable amount of time.  Equipment Recommendations  BSC/3in1;Tub/shower seat    Recommendations for Other Services PT consult     Precautions / Restrictions Precautions Precautions: Back;Fall Precaution Booklet Issued: Yes (comment) Precaution Comments: Spanish written handout provided      Mobility Bed Mobility Overal bed mobility: Needs Assistance Bed Mobility: Rolling, Sidelying to Sit, Sit to Sidelying Rolling: Min assist, Used rails Sidelying to sit: Mod assist     Sit to sidelying: Mod assist General bed mobility comments: multimodal cues for log roll technique, assist to raise trunk and for LEs back into bed    Transfers Overall transfer  level: Needs assistance Equipment used: Rolling walker (2 wheels) Transfers: Sit to/from Stand Sit to Stand: Contact guard assist, Min assist           General transfer comment: cues for hand placement, min assist to rise from bed, CGA from [facility] over toilet      Balance Overall balance assessment: Needs assistance   Sitting balance-Leahy Scale: Fair     Standing balance support: Bilateral upper extremity supported Standing balance-Leahy Scale: Poor                             ADL either performed or assessed with clinical judgement   ADL Overall ADL's : Needs assistance/impaired Eating/Feeding: Independent   Grooming: Contact guard assist;Standing;Wash/dry hands   Upper Body Bathing: Minimal assistance;Sitting   Lower Body Bathing: Maximal assistance;Sit to/from stand   Upper Body Dressing : Set up;Sitting   Lower Body Dressing: Maximal assistance;Sit to/from stand   Toilet Transfer: Contact guard assist;Ambulation;BSC/3in1;Rolling walker (2 wheels)   Toileting- Clothing Manipulation and Hygiene: Minimal assistance;Sit to/from stand       Functional mobility during ADLs: Contact guard assist;Rolling walker (2 wheels)       Vision Ability to See in Adequate Light: 0 Adequate Patient Visual Report: No change from baseline       Perception         Praxis         Pertinent Vitals/Pain Pain Assessment Pain Assessment: 0-10 Pain Score: 8  Pain Location: back Pain Descriptors / Indicators: Grimacing, Guarding, Discomfort Pain Intervention(s): Monitored during session, Premedicated before session, Repositioned  Extremity/Trunk Assessment Upper Extremity Assessment Upper Extremity Assessment: Overall WFL for tasks assessed   Lower Extremity Assessment Lower Extremity Assessment: Generalized weakness   Cervical / Trunk Assessment Cervical / Trunk Assessment: Back Surgery   Communication Communication Cueing Techniques: Verbal  cues;Tactile cues   Cognition Arousal: Alert Behavior During Therapy: WFL for tasks assessed/performed Overall Cognitive Status: Difficult to assess                                 General Comments: language barrier, needs multimodal cues to sequence bed mobility     General Comments       Exercises     Shoulder Instructions      Home Living Family/patient expects to be discharged to:: Private residence Living Arrangements: Children (home with daughter) Available Help at Discharge: Family;Available 24 hours/day Type of Home: House Home Access: Stairs to enter [employer] of Steps: 4 Entrance Stairs-Rails: None Home Layout: One level;Other (Comment) (has 2 steps down into one room, but won't go there)     Bathroom Shower/Tub: Chief Strategy Officer: Standard     Home Equipment: Agricultural Consultant (2 wheels);Cane - single point;Adaptive equipment Adaptive Equipment: Reacher;Sock aid        Prior Functioning/Environment Prior Level of Function : Independent/Modified Independent             Mobility Comments: walking with a cane          OT Problem List: Decreased strength;Decreased activity tolerance;Impaired balance (sitting and/or standing);Decreased safety awareness;Decreased knowledge of precautions;Obesity;Pain      OT Treatment/Interventions: Self-care/ADL training;Therapeutic activities;Patient/family education;Balance training;DME and/or AE instruction    OT Goals(Current goals can be found in the care plan section) Acute Rehab OT Goals OT Goal Formulation: With patient Time For Goal Achievement: 05/31/23 Potential to Achieve Goals: Good ADL Goals Pt Will Perform Grooming: with modified independence;standing Pt Will Transfer to Toilet: with modified independence;ambulating;bedside commode Pt Will Perform Toileting - Clothing Manipulation and hygiene: with modified independence;sit to/from stand Additional ADL Goal  #1: Pt will complete bed mobility using log roll technique with min assist in preparation for ADLs.  OT Frequency: Min 1X/week    Co-evaluation              AM-PAC OT 6 Clicks Daily Activity     Outcome Measure Help from another person eating meals?: None Help from another person taking care of personal grooming?: A Little Help from another person toileting, which includes using toliet, bedpan, or urinal?: A Little Help from another person bathing (including washing, rinsing, drying)?: A Lot Help from another person to put on and taking off regular upper body clothing?: A Little Help from another person to put on and taking off regular lower body clothing?: A Lot 6 Click Score: 17   End of Session Equipment Utilized During Treatment: Gait belt;Rolling walker (2 wheels)  Activity Tolerance: Patient tolerated treatment well Patient left: in bed;with call bell/phone within reach;with family/visitor present  OT Visit Diagnosis: Unsteadiness on feet (R26.81);Other abnormalities of gait and mobility (R26.89);Pain                Time: 9144-9057 OT Time Calculation (min): 47 min Charges:  OT General Charges $OT Visit: 1 Visit OT Evaluation $OT Eval Moderate Complexity: 1 Mod OT Treatments $Self Care/Home Management : 23-37 mins  Mliss HERO, OTR/L Acute Rehabilitation Services Office: [phone]   Kennth Mliss Helling 05/17/2023, 10:26 AM

## 2023-05-18 ENCOUNTER — Inpatient Hospital Stay (HOSPITAL_COMMUNITY): Payer: Self-pay

## 2023-05-18 LAB — CBC
HCT: 25.1 % — ABNORMAL LOW (ref 36.0–46.0)
Hemoglobin: 8.1 g/dL — ABNORMAL LOW (ref 12.0–15.0)
MCH: 28 pg (ref 26.0–34.0)
MCHC: 32.3 g/dL (ref 30.0–36.0)
MCV: 86.9 fL (ref 80.0–100.0)
Platelets: 184 10*3/uL (ref 150–400)
RBC: 2.89 MIL/uL — ABNORMAL LOW (ref 3.87–5.11)
RDW: 13.3 % (ref 11.5–15.5)
WBC: 14.8 10*3/uL — ABNORMAL HIGH (ref 4.0–10.5)
nRBC: 0 % (ref 0.0–0.2)

## 2023-05-18 LAB — GLUCOSE, CAPILLARY
Glucose-Capillary: 150 mg/dL — ABNORMAL HIGH (ref 70–99)
Glucose-Capillary: 201 mg/dL — ABNORMAL HIGH (ref 70–99)
Glucose-Capillary: 133 mg/dL — ABNORMAL HIGH (ref 70–99)
Glucose-Capillary: 149 mg/dL — ABNORMAL HIGH (ref 70–99)

## 2023-05-18 MED ORDER — OXYCODONE HCL 5 MG PO TABS
5.0000 mg | ORAL_TABLET | ORAL | Status: DC | PRN
Start: 2023-05-18 — End: 2023-05-19
  Administered 2023-05-18 – 2023-05-19 (×3): 10 mg via ORAL
  Filled 2023-05-18 (×3): qty 2

## 2023-05-18 MED ORDER — HYDROMORPHONE HCL 1 MG/ML IJ SOLN
0.5000 mg | INTRAMUSCULAR | Status: AC | PRN
  Administered 2023-05-18 – 2023-05-19 (×4): 0.5 mg via INTRAVENOUS
  Filled 2023-05-18 (×4): qty 0.5

## 2023-05-18 MED ORDER — HYDROMORPHONE HCL 1 MG/ML IJ SOLN
1.0000 mg | INTRAMUSCULAR | Status: DC | PRN
  Administered 2023-05-18: 1 mg via INTRAVENOUS
  Filled 2023-05-18: qty 1

## 2023-05-18 NOTE — Progress Notes (Signed)
 OT Cancellation Note  Patient Details Name: Julia Knight MRN: [id number] DOB: [birth year]   Cancelled Treatment:    Reason Eval/Treat Not Completed: Patient declined, no reason specified (Pt reports high levels of pain despite just having pain meds, OT will follow-up with pt as able.)  05/18/2023  AB, OTR/L  Acute Rehabilitation Services  Office: [phone]   [name] 05/18/2023, 1:20 PM

## 2023-05-18 NOTE — Progress Notes (Addendum)
 Orthopedic Surgery Post-operative Progress Note  Assessment: Patient is a 73 y.o. female who is currently admitted after undergoing L2-4 XLIF and PSIF   Plan: -Operative plans complete -Needs upright films when able -Drains to be maintained until output slows -Out of bed as tolerated, no brace -No bending/lifting/twisting greater than 10 pounds -OT evaluate and treat -Pain control. Patient still requiring IV pain medication so will remain admitted until pain controlled only with oral medication -Diabetic diet -No chemoprophylaxis for dvt or antiplatelets for 72 hours after surgery -Disposition: remain floor status   ___________________________________________________________________________   Subjective: No acute events overnight. Still requiring dilaudid  to control her pain. The majority of her pain is in the back. Her leg pain has improved since surgery and she feels less cramping in her thighs.    Objective:  General: no acute distress, laying in bed Neurologic: awake, alert, following commands Respiratory: unlabored breathing on room air Skin: dressings clear/dry/intact, drains with serosanguinous output  MSK (spine):  -Strength exam      Right  Left  EHL    4/5  4/5 TA    5/5  5/5 GSC    5/5  5/5 Knee extension  5/5  5/5 Hip flexion   5/5  4/5  Hip flexion strength testing limited by pain on the left  -Sensory exam    Sensation intact to light touch in L2-S1 nerve distributions of bilateral lower extremities (decreased distal to the knee bilaterally)   Patient name: Julia Knight Patient MRN: [id number] Date: 05/18/23

## 2023-05-18 NOTE — Evaluation (Signed)
 Physical Therapy Evaluation Patient Details Name: Julia Knight MRN: [id number] DOB: 1951-01-23 Today's Date: 05/18/2023  History of Present Illness  Pt is a 73 year old woman admitted on 05/15/22 for L 2-4 XLIF and PSIF. PMH: colon ca, HTN, HLD, GERD, NIDDM, back sx.   Clinical Impression  Patient is s/p above surgery resulting in the deficits listed below (see PT Problem List). PTA pt mod I with cane. On eval, pt required min/mod assist bed mobility, CGA transfers, and CGA amb 12' x 2 with RW. Mobility significantly limited by pain. Pt reporting 10/10 and still requiring IV pain meds. Patient will benefit from acute skilled PT to increase their independence and safety with mobility (while adhering to their precautions) to allow discharge .         If plan is discharge home, recommend the following: A little help with walking and/or transfers;A little help with bathing/dressing/bathroom;Assistance with cooking/housework;Help with stairs or ramp for entrance;Assist for transportation   Can travel by private vehicle        Equipment Recommendations None recommended by PT  Recommendations for Other Services       Functional Status Assessment Patient has had a recent decline in their functional status and demonstrates the ability to make significant improvements in function in a reasonable and predictable amount of time.     Precautions / Restrictions Precautions Precautions: Back;Fall Precaution Comments: reviewed 3/3 precautions      Mobility  Bed Mobility Overal bed mobility: Needs Assistance Bed Mobility: Rolling, Sidelying to Sit, Sit to Sidelying Rolling: Min assist, Used rails Sidelying to sit: Mod assist, Used rails, HOB elevated     Sit to sidelying: Min assist General bed mobility comments: cues for sequencing, logroll    Transfers Overall transfer level: Needs assistance Equipment used: Rolling walker (2 wheels) Transfers: Sit to/from Stand Sit to  Stand: Contact guard assist           General transfer comment: increased time to power up    Ambulation/Gait Ambulation/Gait assistance: Contact guard assist Gait Distance (Feet): 12 Feet (x2) Assistive device: Rolling walker (2 wheels) Gait Pattern/deviations: Step-through pattern, Decreased stride length Gait velocity: decreased Gait velocity interpretation: <1.31 ft/sec, indicative of household ambulator   General Gait Details: amb to/from BR. Distance limited by pain.  Stairs Stairs:  (deferred due to pain)          Wheelchair Mobility     Tilt Bed    Modified Rankin (Stroke Patients Only)       Balance Overall balance assessment: Needs assistance Sitting-balance support: No upper extremity supported, Feet supported Sitting balance-Leahy Scale: Fair     Standing balance support: Bilateral upper extremity supported, During functional activity, Reliant on assistive device for balance Standing balance-Leahy Scale: Poor                               Pertinent Vitals/Pain Pain Assessment Pain Assessment: 0-10 Pain Score: 10-Worst pain ever Pain Location: back Pain Descriptors / Indicators: Grimacing, Guarding, Discomfort Pain Intervention(s): Premedicated before session, Limited activity within patient's tolerance    Home Living Family/patient expects to be discharged to:: Private residence Living Arrangements: Children (home with daughter) Available Help at Discharge: Family;Available 24 hours/day Type of Home: House Home Access: Stairs to enter Entrance Stairs-Rails: None Entrance Stairs-Number of Steps: 4   Home Layout: One level Home Equipment: Agricultural Consultant (2 wheels);Cane - single point;Adaptive equipment  Prior Function Prior Level of Function : Independent/Modified Independent             Mobility Comments: walking with a cane       Extremity/Trunk Assessment   Upper Extremity Assessment Upper Extremity  Assessment: Overall WFL for tasks assessed    Lower Extremity Assessment Lower Extremity Assessment: Generalized weakness    Cervical / Trunk Assessment Cervical / Trunk Assessment: Back Surgery  Communication      Cognition Arousal: Alert Behavior During Therapy: WFL for tasks assessed/performed Overall Cognitive Status: Difficult to assess                                 General Comments: language barrier. Internally distracted by pain.        General Comments      Exercises     Assessment/Plan    PT Assessment Patient needs continued PT services  PT Problem List Decreased strength;Decreased balance;Decreased knowledge of precautions;Pain;Decreased mobility;Decreased knowledge of use of DME;Decreased activity tolerance       PT Treatment Interventions DME instruction;Functional mobility training;Balance training;Patient/family education;Gait training;Therapeutic activities;Stair training    PT Goals (Current goals can be found in the Care Plan section)  Acute Rehab PT Goals Patient Stated Goal: decrease pain PT Goal Formulation: With patient/family Time For Goal Achievement: 06/01/23 Potential to Achieve Goals: Good    Frequency Min 5X/week     Co-evaluation               AM-PAC PT 6 Clicks Mobility  Outcome Measure Help needed turning from your back to your side while in a flat bed without using bedrails?: A Little Help needed moving from lying on your back to sitting on the side of a flat bed without using bedrails?: A Lot Help needed moving to and from a bed to a chair (including a wheelchair)?: A Little Help needed standing up from a chair using your arms (e.g., wheelchair or bedside chair)?: A Little Help needed to walk in hospital room?: A Little Help needed climbing 3-5 steps with a railing? : A Lot 6 Click Score: 16    End of Session Equipment Utilized During Treatment: Gait belt Activity Tolerance: Patient limited by  pain Patient left: in bed;with call bell/phone within reach;with family/visitor present Nurse Communication: Mobility status PT Visit Diagnosis: Difficulty in walking, not elsewhere classified (R26.2);Pain    Time: 9168-9153 PT Time Calculation (min) (ACUTE ONLY): 15 min   Charges:   PT Evaluation $PT Eval Moderate Complexity: 1 Mod   PT General Charges $$ ACUTE PT VISIT: 1 Visit         Sari MATSU., PT  Office # [phone]   Julia Knight 05/18/2023, 11:55 AM

## 2023-05-18 NOTE — Plan of Care (Signed)

## 2023-05-18 NOTE — Progress Notes (Signed)
 Transition of Care [facility]) - Inpatient Brief Assessment   Patient Details  Name: Julia Knight MRN: [id number] Date of Birth: June 09, 1950  Transition of [facility] CM/SW Contact:    Rosaline JONELLE Joe, RN Phone Number: 05/18/2023, 3:22 PM   Clinical Narrative: Patient is S/P posterior spinal fusion and instrumentation.  Patient remains inpatient until pain controlled and released to home by attending physician.  Patient was seen by PT and no TOC needs at this time.   Transition of Care Asessment: Insurance and Status: (P) Insurance coverage has been reviewed Patient has primary care physician: (P) Yes Home environment has been reviewed: (P) from home with family Prior level of function:: (P) independent Prior/Current Home Services: (P) No current home services Social Drivers of Health Review: (P) SDOH reviewed no interventions necessary Readmission risk has been reviewed: (P) Yes Transition of care needs: (P) no transition of care needs at this time

## 2023-05-19 LAB — GLUCOSE, CAPILLARY
Glucose-Capillary: 97 mg/dL (ref 70–99)
Glucose-Capillary: 161 mg/dL — ABNORMAL HIGH (ref 70–99)
Glucose-Capillary: 138 mg/dL — ABNORMAL HIGH (ref 70–99)
Glucose-Capillary: 118 mg/dL — ABNORMAL HIGH (ref 70–99)

## 2023-05-19 MED ORDER — MIDODRINE HCL 5 MG PO TABS
2.5000 mg | ORAL_TABLET | Freq: Three times a day (TID) | ORAL | Status: DC
  Administered 2023-05-19 (×3): 2.5 mg via ORAL
  Filled 2023-05-19 (×3): qty 1

## 2023-05-19 MED ORDER — OXYCODONE HCL 5 MG PO TABS
10.0000 mg | ORAL_TABLET | ORAL | Status: DC
  Administered 2023-05-19 – 2023-05-21 (×12): 10 mg via ORAL
  Filled 2023-05-19 (×12): qty 2

## 2023-05-19 MED ORDER — KETOROLAC TROMETHAMINE 15 MG/ML IJ SOLN
15.0000 mg | Freq: Four times a day (QID) | INTRAMUSCULAR | Status: AC
  Administered 2023-05-19 – 2023-05-20 (×4): 15 mg via INTRAVENOUS
  Filled 2023-05-19 (×4): qty 1

## 2023-05-19 MED ORDER — LACTATED RINGERS IV SOLN: INTRAVENOUS | Status: AC

## 2023-05-19 MED ORDER — MAGNESIUM CITRATE PO SOLN
1.0000 | Freq: Once | ORAL | Status: AC
  Administered 2023-05-19: 1 via ORAL
  Filled 2023-05-19: qty 296

## 2023-05-19 MED ORDER — MIDODRINE HCL 5 MG PO TABS
5.0000 mg | ORAL_TABLET | Freq: Three times a day (TID) | ORAL | Status: DC
  Administered 2023-05-20 – 2023-05-21 (×4): 5 mg via ORAL
  Filled 2023-05-19 (×4): qty 1

## 2023-05-19 NOTE — Progress Notes (Signed)
 Attempted to call on call for ortho about hypotension.  Unassigned on call for ortho denies being on call for this patient and suggested I call Dr. Christell Constant.  Unable to get Dr. Christell Constant.  Pt asymptomatic, will recheck BP in 1 hour.  Charge nurse aware.

## 2023-05-19 NOTE — Plan of Care (Signed)
  Problem: Education: Goal: Knowledge of General Education information will improve Description: Including pain rating scale, medication(s)/side effects and non-pharmacologic comfort measures Outcome: Progressing   Problem: Health Behavior/Discharge Planning: Goal: Ability to manage health-related needs will improve Outcome: Progressing   Problem: Clinical Measurements: Goal: Will remain free from infection Outcome: Progressing Goal: Diagnostic test results will improve Outcome: Progressing Goal: Respiratory complications will improve Outcome: Progressing Goal: Cardiovascular complication will be avoided Outcome: Progressing   Problem: Activity: Goal: Risk for activity intolerance will decrease Outcome: Progressing   Problem: Nutrition: Goal: Adequate nutrition will be maintained Outcome: Progressing   Problem: Coping: Goal: Level of anxiety will decrease Outcome: Progressing   Problem: Elimination: Goal: Will not experience complications related to bowel motility Outcome: Progressing Goal: Will not experience complications related to urinary retention Outcome: Progressing   Problem: Pain Management: Goal: General experience of comfort will improve Outcome: Progressing   Problem: Safety: Goal: Ability to remain free from injury will improve Outcome: Progressing   Problem: Skin Integrity: Goal: Risk for impaired skin integrity will decrease Outcome: Progressing   Problem: Education: Goal: Ability to describe self-care measures that may prevent or decrease complications (Diabetes Survival Skills Education) will improve Outcome: Progressing Goal: Individualized Educational Video(s) Outcome: Progressing   Problem: Coping: Goal: Ability to adjust to condition or change in health will improve Outcome: Progressing   Problem: Fluid Volume: Goal: Ability to maintain a balanced intake and output will improve Outcome: Progressing   Problem: Health  Behavior/Discharge Planning: Goal: Ability to identify and utilize available resources and services will improve Outcome: Progressing Goal: Ability to manage health-related needs will improve Outcome: Progressing   Problem: Metabolic: Goal: Ability to maintain appropriate glucose levels will improve Outcome: Progressing   Problem: Nutritional: Goal: Maintenance of adequate nutrition will improve Outcome: Progressing Goal: Progress toward achieving an optimal weight will improve Outcome: Progressing   Problem: Skin Integrity: Goal: Risk for impaired skin integrity will decrease Outcome: Progressing   Problem: Tissue Perfusion: Goal: Adequacy of tissue perfusion will improve Outcome: Progressing

## 2023-05-19 NOTE — Progress Notes (Signed)
 Physical Therapy Treatment Patient Details Name: Julia Knight MRN: [id number] DOB: 04/19/1951 Today's Date: 05/19/2023   History of Present Illness Pt is a 73 year old woman admitted on 05/15/22 for L 2-4 XLIF and PSIF. PMH: colon ca, HTN, HLD, GERD, NIDDM, back sx.    PT Comments  Progressing well towards acute PT goals. Met ambulatory goal of >125 at supervision level with RW for support. No LOB noted, no buckling. Required mod assist for bed mobility; reviewed log roll technique with pt and daughter. Will continue to progress as tolerated. Reviewed LE exercises. Has several steps to navigate at home, would like to practice this prior to d/c. Patient will continue to benefit from skilled physical therapy services to further improve independence with functional mobility.     If plan is discharge home, recommend the following: A little help with walking and/or transfers;A little help with bathing/dressing/bathroom;Assistance with cooking/housework;Help with stairs or ramp for entrance;Assist for transportation   Can travel by private vehicle        Equipment Recommendations  None recommended by PT    Recommendations for Other Services       Precautions / Restrictions Precautions Precautions: Back;Fall Precaution Booklet Issued: Yes (comment) Precaution Comments: reviewed 3/3 precautions     Mobility  Bed Mobility Overal bed mobility: Needs Assistance Bed Mobility: Rolling, Sidelying to Sit, Sit to Sidelying Rolling: Min assist, Used rails Sidelying to sit: Mod assist, Used rails, HOB elevated     Sit to sidelying: Min assist General bed mobility comments: Min assist for log roll technique, with up to mod assist to rise to EOB. cues throughout with daughter present and observing.    Transfers Overall transfer level: Needs assistance Equipment used: Rolling walker (2 wheels) Transfers: Sit to/from Stand Sit to Stand: Contact guard assist           General  transfer comment: CGA for safety, fair power up from EOB. RW for support.    Ambulation/Gait Ambulation/Gait assistance: Supervision Gait Distance (Feet): 185 Feet Assistive device: Rolling walker (2 wheels) Gait Pattern/deviations: Step-through pattern, Decreased stride length Gait velocity: decreased Gait velocity interpretation: <1.8 ft/sec, indicate of risk for recurrent falls   General Gait Details: Slower gait speed, slightly guarded. No overt LOB noted. Required a couple of brief standing rest breaks to complete distance. Good RW control. Supervision for safety, no buckling.   Stairs             Wheelchair Mobility     Tilt Bed    Modified Rankin (Stroke Patients Only)       Balance Overall balance assessment: Needs assistance Sitting-balance support: No upper extremity supported, Feet supported Sitting balance-Leahy Scale: Fair     Standing balance support: During functional activity, No upper extremity supported Standing balance-Leahy Scale: Fair                              Cognition Arousal: Alert Behavior During Therapy: WFL for tasks assessed/performed Overall Cognitive Status: Within Functional Limits for tasks assessed                                          Exercises General Exercises - Lower Extremity Ankle Circles/Pumps: AROM, Both, 10 reps, Supine Gluteal Sets: Strengthening, Both, Supine, 5 reps Short Arc Quad: Strengthening, Both, 5 reps, Supine  General Comments General comments (skin integrity, edema, etc.): Prefers daughter translate. Present, supportive. All questions answered.      Pertinent Vitals/Pain Pain Assessment Pain Assessment: Faces Faces Pain Scale: Hurts even more Pain Location: back Pain Descriptors / Indicators: Grimacing, Guarding, Discomfort Pain Intervention(s): Monitored during session, Repositioned    Home Living                          Prior Function             PT Goals (current goals can now be found in the care plan section) Acute Rehab PT Goals Patient Stated Goal: decrease pain PT Goal Formulation: With patient/family Time For Goal Achievement: 06/01/23 Potential to Achieve Goals: Good Progress towards PT goals: Progressing toward goals    Frequency    Min 5X/week      PT Plan      Co-evaluation              AM-PAC PT 6 Clicks Mobility   Outcome Measure  Help needed turning from your back to your side while in a flat bed without using bedrails?: A Little Help needed moving from lying on your back to sitting on the side of a flat bed without using bedrails?: A Lot Help needed moving to and from a bed to a chair (including a wheelchair)?: A Little Help needed standing up from a chair using your arms (e.g., wheelchair or bedside chair)?: A Little Help needed to walk in hospital room?: A Little Help needed climbing 3-5 steps with a railing? : A Little 6 Click Score: 17    End of Session Equipment Utilized During Treatment: Gait belt Activity Tolerance: Patient tolerated treatment well Patient left: in bed;with call bell/phone within reach;with family/visitor present;with bed alarm set   PT Visit Diagnosis: Difficulty in walking, not elsewhere classified (R26.2);Pain Pain - part of body:  (back)     Time: 8589-8577 PT Time Calculation (min) (ACUTE ONLY): 12 min  Charges:    $Gait Training: 8-22 mins PT General Charges $$ ACUTE PT VISIT: 1 Visit                     Leontine Roads, PT, DPT [facility]  Rehabilitation Services Physical Therapist Office: [phone] Website: [email or web address]    Leontine GORMAN Roads 05/19/2023, 2:40 PM

## 2023-05-19 NOTE — Progress Notes (Signed)
 OT Cancellation Note  Patient Details Name: Julia Knight MRN: [id number] DOB: [birth year]   Cancelled Treatment:    Reason Eval/Treat Not Completed: Pain limiting ability to participate.  Family member present and confirms she is comfortable interpreting and declined need for other interpreting assistance.  Pt. Asks therapist asst. To come back later after new medications available.  Will attempt back as able.    CHRISTELLA Nest Lorraine-COTA/L 05/19/2023, 10:01 AM

## 2023-05-19 NOTE — Progress Notes (Addendum)
 Orthopedic Surgery Post-operative Progress Note  Assessment: Patient is a 73 y.o. female who is currently admitted after undergoing L2-4 XLIF and PSIF   Plan: -Operative plans complete -Drains removed this morning -Out of bed as tolerated, no brace -No bending/lifting/twisting greater than 10 pounds -OT evaluate and treat -Pain control - still having significant back pain -Diabetic diet -Continue with bowel regimen, added mag citrate for today -No chemoprophylaxis for dvt or antiplatelets for 72 hours after surgery -Disposition: remain floor status   ___________________________________________________________________________   Subjective: No acute events overnight. Still having significant back pain. Leg pain much improved since surgery. No longer feeling cramps in the legs. Has not had a BM yet. Has been passing flatus   Objective:  General: no acute distress, laying in bed Neurologic: awake, alert, following commands Respiratory: unlabored breathing on room air Skin: dressings clear/dry/intact, drains with minimal serosanguinous output  MSK (spine):  -Strength exam      Right  Left  EHL    4/5  4/5 TA    5/5  5/5 GSC    5/5  5/5 Knee extension  5/5  5/5 Hip flexion   5/5  4/5  Hip flexion strength testing limited by pain on the left  -Sensory exam    Sensation intact to light touch in L2-S1 nerve distributions of bilateral lower extremities (decreased distal to the knee bilaterally)   Patient name: Julia Knight Patient MRN: [id number] Date: 05/19/23

## 2023-05-20 LAB — BASIC METABOLIC PANEL
Anion gap: 9 (ref 5–15)
BUN: 36 mg/dL — ABNORMAL HIGH (ref 8–23)
CO2: 25 mmol/L (ref 22–32)
Calcium: 8.6 mg/dL — ABNORMAL LOW (ref 8.9–10.3)
Chloride: 101 mmol/L (ref 98–111)
Creatinine, Ser: 1.78 mg/dL — ABNORMAL HIGH (ref 0.44–1.00)
GFR, Estimated: 30 mL/min — ABNORMAL LOW (ref >60)
Glucose, Bld: 111 mg/dL — ABNORMAL HIGH (ref 70–99)
Potassium: 4.5 mmol/L (ref 3.5–5.1)
Sodium: 135 mmol/L (ref 135–145)

## 2023-05-20 LAB — CBC
HCT: 23.4 % — ABNORMAL LOW (ref 36.0–46.0)
Hemoglobin: 7.6 g/dL — ABNORMAL LOW (ref 12.0–15.0)
MCH: 28.4 pg (ref 26.0–34.0)
MCHC: 32.5 g/dL (ref 30.0–36.0)
MCV: 87.3 fL (ref 80.0–100.0)
Platelets: 208 10*3/uL (ref 150–400)
RBC: 2.68 MIL/uL — ABNORMAL LOW (ref 3.87–5.11)
RDW: 13.2 % (ref 11.5–15.5)
WBC: 9.9 10*3/uL (ref 4.0–10.5)
nRBC: 0 % (ref 0.0–0.2)

## 2023-05-20 LAB — GLUCOSE, CAPILLARY
Glucose-Capillary: 118 mg/dL — ABNORMAL HIGH (ref 70–99)
Glucose-Capillary: 164 mg/dL — ABNORMAL HIGH (ref 70–99)
Glucose-Capillary: 136 mg/dL — ABNORMAL HIGH (ref 70–99)
Glucose-Capillary: 111 mg/dL — ABNORMAL HIGH (ref 70–99)

## 2023-05-20 NOTE — Plan of Care (Signed)

## 2023-05-20 NOTE — Progress Notes (Signed)
 Pt states she has 10/10 pain to her lower back despite meds, repositioning, and other alternatives. On call MD notified. No new orders given at this time.

## 2023-05-20 NOTE — Progress Notes (Signed)
 Physical Therapy Treatment Patient Details Name: Julia Knight MRN: [id number] DOB: 01/22/1951 Today's Date: 05/20/2023   History of Present Illness Pt is a 73 year old woman admitted on 05/15/22 for L 2-4 XLIF and PSIF. PMH: colon ca, HTN, HLD, GERD, NIDDM, back sx.    PT Comments  Tolerated well and continues to progress nicely towards acute rehab goals. Completed stair training with family assistance - safest while using posterior approach with RW, family to block RW, they feel confident with this task. Ambulating supervision level with RW >150 feet. Min assist to rise from sidelying position in bed. Patient will continue to benefit from skilled physical therapy services to further improve independence with functional mobility.     If plan is discharge home, recommend the following: A little help with walking and/or transfers;A little help with bathing/dressing/bathroom;Assistance with cooking/housework;Help with stairs or ramp for entrance;Assist for transportation   Can travel by private vehicle        Equipment Recommendations  None recommended by PT    Recommendations for Other Services       Precautions / Restrictions Precautions Precautions: Back;Fall Precaution Booklet Issued: Yes (comment) Precaution Comments: reviewed 3/3 precautions     Mobility  Bed Mobility Overal bed mobility: Needs Assistance Bed Mobility: Rolling, Sidelying to Sit, Sit to Sidelying Rolling: Used rails, Contact guard assist Sidelying to sit: Used rails, HOB elevated, Min assist       General bed mobility comments: CGA to roll using rail, min assist to rise to EOB. VC for precautions.    Transfers Overall transfer level: Needs assistance Equipment used: Rolling walker (2 wheels) Transfers: Sit to/from Stand Sit to Stand: Contact guard assist           General transfer comment: CGA for safety, no physical assist, stabilized with RW for support.     Ambulation/Gait Ambulation/Gait assistance: Supervision Gait Distance (Feet): 160 Feet Assistive device: Rolling walker (2 wheels) Gait Pattern/deviations: Step-through pattern, Decreased stride length Gait velocity: decreased Gait velocity interpretation: <1.8 ft/sec, indicate of risk for recurrent falls   General Gait Details: Slightly guarded but stable without evidence of buckling or overt LOB. Good RW control, Supervision for safety.   Stairs Stairs: Yes Stairs assistance: Min assist Stair Management: No rails, Step to pattern, Forwards, With walker, Backwards Number of Stairs: 3 (x2) General stair comments: Pt reports no rails at home. Family present to assist and observe during session. Tried forward with hand held assist. this was challenging for pt due to LE weakness and impaired balance. Min assist to stabilize, not confident. Reviewed posterior approach with RW. Family blocked RW. Educated on sequencing. Pt feels more confident with this task, showed good control. Declines further training today.   Wheelchair Mobility     Tilt Bed    Modified Rankin (Stroke Patients Only)       Balance Overall balance assessment: Needs assistance Sitting-balance support: No upper extremity supported, Feet supported Sitting balance-Leahy Scale: Fair     Standing balance support: During functional activity, No upper extremity supported Standing balance-Leahy Scale: Fair                              Cognition Arousal: Alert Behavior During Therapy: WFL for tasks assessed/performed Overall Cognitive Status: Within Functional Limits for tasks assessed  Exercises      General Comments General comments (skin integrity, edema, etc.): Family prefers to interpret.      Pertinent Vitals/Pain Pain Assessment Pain Assessment: Faces Faces Pain Scale: Hurts little more Pain Location: back Pain Descriptors /  Indicators: Guarding, Discomfort Pain Intervention(s): Monitored during session, Repositioned    Home Living                          Prior Function            PT Goals (current goals can now be found in the care plan section) Acute Rehab PT Goals Patient Stated Goal: decrease pain PT Goal Formulation: With patient/family Time For Goal Achievement: 06/01/23 Potential to Achieve Goals: Good Progress towards PT goals: Progressing toward goals    Frequency    Min 5X/week      PT Plan      Co-evaluation              AM-PAC PT 6 Clicks Mobility   Outcome Measure  Help needed turning from your back to your side while in a flat bed without using bedrails?: A Little Help needed moving from lying on your back to sitting on the side of a flat bed without using bedrails?: A Little Help needed moving to and from a bed to a chair (including a wheelchair)?: A Little Help needed standing up from a chair using your arms (e.g., wheelchair or bedside chair)?: A Little Help needed to walk in hospital room?: A Little Help needed climbing 3-5 steps with a railing? : A Little 6 Click Score: 18    End of Session Equipment Utilized During Treatment: Gait belt Activity Tolerance: Patient tolerated treatment well Patient left: with call bell/phone within reach;with family/visitor present;in chair Nurse Communication: Mobility status PT Visit Diagnosis: Difficulty in walking, not elsewhere classified (R26.2);Pain Pain - part of body:  (back)     Time: 8672-8658 PT Time Calculation (min) (ACUTE ONLY): 14 min  Charges:    $Gait Training: 8-22 mins PT General Charges $$ ACUTE PT VISIT: 1 Visit                     Leontine Roads, PT, DPT [facility]  Rehabilitation Services Physical Therapist Office: [phone] Website: [email or web address]    Leontine GORMAN Roads 05/20/2023, 2:47 PM

## 2023-05-20 NOTE — Progress Notes (Addendum)
 Orthopedic Surgery Post-operative Progress Note  Assessment: Patient is a 73 y.o. female who is currently admitted after undergoing L2-4 XLIF and PSIF   Plan: -Operative plans complete -Out of bed as tolerated, no brace -No bending/lifting/twisting greater than 10 pounds -OT evaluate and treat -Pain control -Diabetic diet -Continue with bowel regimen -No chemoprophylaxis for dvt or antiplatelets for 72 hours after surgery -Disposition: remain floor status  Acute kidney injury -Creatinine up from baseline of ~1.3 to 1.78 -Will start fluids and recheck in the morning ___________________________________________________________________________   Subjective: No acute events overnight. Pain is much better today in her back. Leg pain still significantly improved since surgery.    Objective:  General: no acute distress, getting out of bed on her own with a walker Neurologic: awake, alert, following commands Respiratory: unlabored breathing on room air Skin: dressings clear/dry/intact  MSK (spine):  -Strength exam      Right  Left  EHL    4/5  4/5 TA    5/5  5/5 GSC    5/5  5/5 Knee extension  5/5  5/5 Hip flexion   5/5  4/5  -Sensory exam    Sensation intact to light touch in L2-S1 nerve distributions of bilateral lower extremities (decreased distal to the knee bilaterally)   Patient name: Julia Knight Patient MRN: [id number] Date: 05/20/23

## 2023-05-21 LAB — BASIC METABOLIC PANEL WITH GFR
Anion gap: 6 (ref 5–15)
BUN: 26 mg/dL — ABNORMAL HIGH (ref 8–23)
CO2: 27 mmol/L (ref 22–32)
Calcium: 8.6 mg/dL — ABNORMAL LOW (ref 8.9–10.3)
Chloride: 104 mmol/L (ref 98–111)
Creatinine, Ser: 1.22 mg/dL — ABNORMAL HIGH (ref 0.44–1.00)
GFR, Estimated: 48 mL/min — ABNORMAL LOW
Glucose, Bld: 106 mg/dL — ABNORMAL HIGH (ref 70–99)
Potassium: 5.1 mmol/L (ref 3.5–5.1)
Sodium: 137 mmol/L (ref 135–145)

## 2023-05-21 MED ORDER — CYCLOBENZAPRINE HCL 10 MG PO TABS: 10.0000 mg | ORAL_TABLET | Freq: Three times a day (TID) | ORAL | 0 refills | Status: AC

## 2023-05-21 MED ORDER — OXYCODONE HCL 5 MG PO TABS
5.0000 mg | ORAL_TABLET | ORAL | 0 refills | Status: AC | PRN
Start: 2023-05-21 — End: 2023-05-28

## 2023-05-21 MED ORDER — POLYETHYLENE GLYCOL 3350 17 G PO PACK: 17.0000 g | PACK | Freq: Every day | ORAL | 0 refills | Status: AC

## 2023-05-21 MED ORDER — SENNA 8.6 MG PO TABS: 1.0000 | ORAL_TABLET | Freq: Two times a day (BID) | ORAL | 0 refills | Status: AC

## 2023-05-21 MED ORDER — MIDODRINE HCL 2.5 MG PO TABS: 2.5000 mg | ORAL_TABLET | Freq: Three times a day (TID) | ORAL | 0 refills | Status: AC

## 2023-05-21 MED ORDER — ACETAMINOPHEN 500 MG PO TABS: 1000.0000 mg | ORAL_TABLET | Freq: Three times a day (TID) | ORAL | 0 refills | Status: AC

## 2023-05-21 NOTE — Progress Notes (Addendum)
 Orthopedic Surgery Post-operative Progress Note  Assessment: Patient is a 73 y.o. female who is currently admitted after undergoing L2-4 XLIF and PSIF   Plan: -Operative plans complete -Out of bed as tolerated, no brace -No bending/lifting/twisting greater than 10 pounds -PT/OT evaluate and treat -Pain control -Diabetic diet -Continue with bowel regimen -DVT ppx: lovenox -Anticipate discharge to home today  Acute kidney injury -Creatinine has returned to baseline with fluid administration -Creatinine of 1.22 this morning ___________________________________________________________________________   Subjective: No acute events overnight. Pain is significantly better and is now well controlled with oral medications. Leg pain still improved since surgery. Has had a bowel movement. Voiding spontaneously.    Objective:  General: no acute distress, laying in bed Neurologic: awake, alert, following commands Respiratory: unlabored breathing on room air Skin: dressings clear/dry/intact  MSK (spine):  -Strength exam      Right  Left  EHL    4/5  4/5 TA    5/5  5/5 GSC    5/5  5/5 Knee extension  5/5  5/5 Hip flexion   5/5  4/5  -Sensory exam    Sensation intact to light touch in L2-S1 nerve distributions of bilateral lower extremities (decreased distal to the knee bilaterally)   Patient name: Julia Knight Patient MRN: [id number] Date: 05/21/23

## 2023-05-21 NOTE — Plan of Care (Signed)

## 2023-05-21 NOTE — Progress Notes (Signed)
 Occupational Therapy Treatment Patient Details Name: Julia Knight MRN: [id number] DOB: 09-Mar-1951 Today's Date: 05/21/2023   History of present illness Pt is a 73 year old woman admitted on 05/15/22 for L 2-4 XLIF and PSIF. PMH: colon ca, HTN, HLD, GERD, NIDDM, back sx.   OT comments  Patient demonstrating good gains with OT treatment. Family and patient demonstrating good understanding of log rolling technique for bed mobility. Patient supervision to [facility] for mobility and transfers and decreased complaints of pain. Patient to be discharged home with family to assist with care.       If plan is discharge home, recommend the following:  A little help with walking and/or transfers;A lot of help with bathing/dressing/bathroom;Assistance with cooking/housework;Assist for transportation;Help with stairs or ramp for entrance   Equipment Recommendations  BSC/3in1;Tub/shower seat    Recommendations for Other Services      Precautions / Restrictions Precautions Precautions: Back;Fall Precaution Booklet Issued: Yes (comment) Precaution Comments: reviewed 3/3 precautions Restrictions Weight Bearing Restrictions Per Provider Order: No       Mobility Bed Mobility Overal bed mobility: Needs Assistance Bed Mobility: Rolling, Sidelying to Sit Rolling: Used rails, Contact guard assist Sidelying to sit: Used rails, HOB elevated, Min assist       General bed mobility comments: Patient's daughter assisted with assistance for BLEs    Transfers Overall transfer level: Needs assistance Equipment used: Rolling walker (2 wheels) Transfers: Bed to chair/wheelchair/BSC, Sit to/from Stand Sit to Stand: Contact guard assist           General transfer comment: CGA for mobility and transfers with cues for hand placement     Balance Overall balance assessment: Needs assistance Sitting-balance support: No upper extremity supported, Feet supported Sitting balance-Leahy Scale: Fair      Standing balance support: During functional activity, No upper extremity supported Standing balance-Leahy Scale: Fair                             ADL either performed or assessed with clinical judgement   ADL Overall ADL's : Needs assistance/impaired     Grooming: Supervision/safety;Standing;Wash/dry hands;Wash/dry face;Oral care                   Toilet Transfer: Contact guard assist;Ambulation;Regular Toilet;Rolling walker (2 wheels)   Toileting- Clothing Manipulation and Hygiene: Modified independent;Sitting/lateral lean         General ADL Comments: good gains with self care tasks with decreased pain    Extremity/Trunk Assessment              Vision       Perception     Praxis      Cognition Arousal: Alert Behavior During Therapy: WFL for tasks assessed/performed Overall Cognitive Status: Within Functional Limits for tasks assessed                                 General Comments: demonstrated good safety and adhered to back precautions        Exercises      Shoulder Instructions       General Comments family prefers to interpret    Pertinent Vitals/ Pain       Pain Assessment Pain Assessment: 0-10 Pain Score: 2  Pain Location: back Pain Descriptors / Indicators: Guarding, Discomfort Pain Intervention(s): Monitored during session, Premedicated before session, Repositioned  Home Living Family/patient expects to be  discharged to:: Private residence Living Arrangements: Children                                      Prior Functioning/Environment              Frequency  Min 1X/week        Progress Toward Goals  OT Goals(current goals can now be found in the care plan section)  Progress towards OT goals: Progressing toward goals  Acute Rehab OT Goals OT Goal Formulation: With patient Time For Goal Achievement: 05/31/23 Potential to Achieve Goals: Good ADL Goals Pt Will  Perform Grooming: with modified independence;standing Pt Will Transfer to Toilet: with modified independence;ambulating;bedside commode Pt Will Perform Toileting - Clothing Manipulation and hygiene: with modified independence;sit to/from stand Additional ADL Goal #1: Pt will complete bed mobility using log roll technique with min assist in preparation for ADLs.  Plan      Co-evaluation                 AM-PAC OT 6 Clicks Daily Activity     Outcome Measure   Help from another person eating meals?: None Help from another person taking care of personal grooming?: A Little Help from another person toileting, which includes using toliet, bedpan, or urinal?: A Little Help from another person bathing (including washing, rinsing, drying)?: A Lot Help from another person to put on and taking off regular upper body clothing?: A Little Help from another person to put on and taking off regular lower body clothing?: A Lot 6 Click Score: 17    End of Session Equipment Utilized During Treatment: Gait belt;Rolling walker (2 wheels)  OT Visit Diagnosis: Unsteadiness on feet (R26.81);Other abnormalities of gait and mobility (R26.89);Pain Pain - part of body:  (back)   Activity Tolerance Patient tolerated treatment well   Patient Left in chair;with call bell/phone within reach;with family/visitor present   Nurse Communication Mobility status        Time: 1003-1020 OT Time Calculation (min): 17 min  Charges: OT General Charges $OT Visit: 1 Visit OT Treatments $Self Care/Home Management : 8-22 mins  Dick Laine, OTA Acute Rehabilitation Services  Office [phone]   Jeb LITTIE Laine 05/21/2023, 1:25 PM

## 2023-05-22 LAB — GLUCOSE, CAPILLARY: Glucose-Capillary: 107 mg/dL — ABNORMAL HIGH (ref 70–99)

## 2023-05-29 ENCOUNTER — Other Ambulatory Visit: Payer: Self-pay

## 2023-05-29 ENCOUNTER — Other Ambulatory Visit (INDEPENDENT_AMBULATORY_CARE_PROVIDER_SITE_OTHER): Payer: Self-pay

## 2023-05-29 ENCOUNTER — Ambulatory Visit (INDEPENDENT_AMBULATORY_CARE_PROVIDER_SITE_OTHER): Payer: Self-pay | Admitting: Orthopedic Surgery

## 2023-05-29 DIAGNOSIS — Z981 Arthrodesis status: Secondary | ICD-10-CM

## 2023-05-29 MED ORDER — OXYCODONE HCL 5 MG PO TABS
5.0000 mg | ORAL_TABLET | ORAL | 0 refills | Status: AC | PRN
  Filled 2023-05-29: qty 40, 7d supply, fill #0

## 2023-05-29 NOTE — Progress Notes (Addendum)
 Orthopedic Surgery Post-operative Office Visit  Procedure: L2-L4 XLIF and L2-4 PSIF Date of Surgery: 05/16/2023 (~2 weeks post-op)  Assessment: Patient is a 73 y.o. who is doing well after surgery. Back pain improving, not having any significant leg pain   Plan: -Operative plans complete -Out of bed as tolerated, no brace -Okay to let soap/water run over her incision, but do not submerge -No bending/lifting/twisting greater than 10 pounds -Pain management: weaning oxycodone -Return to office in 4 weeks, x-rays needed at next visit: AP/lateral lumbar  ___________________________________________________________________________   Subjective: Patient is at home.  She says her back pain has slowly been getting better.  She is taking 5 mg of oxycodone every 4-6 hours.  She is not having any radiating leg pain.  Her thigh cramping has improved since surgery.  She has not noticed any redness or drainage around her incisions.  Objective:  General: no acute distress, appropriate affect, ambulating with walker Neurologic: alert, answering questions appropriately, following commands Respiratory: unlabored breathing on room air Skin: incisions are well approximated with no erythema, induration, active/expressible drainage  MSK (spine):  -Strength exam      Left  Right  EHL    4/5  4/5 TA    5/5  5/5 GSC    5/5  5/5 Knee extension  5/5  5/5 Hip flexion   4/5  5/5  -Sensory exam    Sensation intact to light touch in L2-S1 nerve distributions of bilateral lower extremities  Imaging: X-rays of the lumbar spine from 05/29/2023 were independently reviewed and interpreted, showing posterior instrumentation from L2-S1.  No lucency seen around the screws. Rods well seated within connectors.  Interbody devices from L2/3 to L5/S1.  Interbody's appear in appropriate position.  No fracture or dislocation seen.  PI was 43 preoperatively.  Hips center not well visualized on today's films. LL of  38.   Patient name: Julia Knight Patient MRN: [id number] Date of visit: 05/29/23

## 2023-05-30 ENCOUNTER — Other Ambulatory Visit: Payer: Self-pay

## 2023-06-07 ENCOUNTER — Emergency Department (HOSPITAL_BASED_OUTPATIENT_CLINIC_OR_DEPARTMENT_OTHER): Payer: Self-pay

## 2023-06-07 ENCOUNTER — Other Ambulatory Visit: Payer: Self-pay

## 2023-06-07 ENCOUNTER — Emergency Department (HOSPITAL_BASED_OUTPATIENT_CLINIC_OR_DEPARTMENT_OTHER)
Admission: EM | Admit: 2023-06-07 | Discharge: 2023-06-08 | Disposition: A | Discharge status: Home / Self Care | Payer: Self-pay | Attending: Emergency Medicine | Admitting: Emergency Medicine

## 2023-06-07 ENCOUNTER — Encounter (HOSPITAL_BASED_OUTPATIENT_CLINIC_OR_DEPARTMENT_OTHER): Payer: Self-pay | Admitting: Emergency Medicine

## 2023-06-07 DIAGNOSIS — Z7984 Long term (current) use of oral hypoglycemic drugs: Secondary | ICD-10-CM | POA: Insufficient documentation

## 2023-06-07 DIAGNOSIS — Z20822 Contact with and (suspected) exposure to covid-19: Secondary | ICD-10-CM | POA: Insufficient documentation

## 2023-06-07 DIAGNOSIS — E119 Type 2 diabetes mellitus without complications: Secondary | ICD-10-CM | POA: Insufficient documentation

## 2023-06-07 DIAGNOSIS — Z85038 Personal history of other malignant neoplasm of large intestine: Secondary | ICD-10-CM | POA: Insufficient documentation

## 2023-06-07 DIAGNOSIS — K2970 Gastritis, unspecified, without bleeding: Secondary | ICD-10-CM

## 2023-06-07 DIAGNOSIS — R112 Nausea with vomiting, unspecified: Secondary | ICD-10-CM

## 2023-06-07 LAB — COMPREHENSIVE METABOLIC PANEL
ALT: 8 U/L (ref 0–44)
AST: 9 U/L — ABNORMAL LOW (ref 15–41)
Albumin: 4.2 g/dL (ref 3.5–5.0)
Alkaline Phosphatase: 178 U/L — ABNORMAL HIGH (ref 38–126)
Anion gap: 10 (ref 5–15)
BUN: 12 mg/dL (ref 8–23)
CO2: 25 mmol/L (ref 22–32)
Calcium: 10 mg/dL (ref 8.9–10.3)
Chloride: 98 mmol/L (ref 98–111)
Creatinine, Ser: 1.27 mg/dL — ABNORMAL HIGH (ref 0.44–1.00)
GFR, Estimated: 45 mL/min — ABNORMAL LOW (ref >60)
Glucose, Bld: 195 mg/dL — ABNORMAL HIGH (ref 70–99)
Potassium: 5.1 mmol/L (ref 3.5–5.1)
Sodium: 133 mmol/L — ABNORMAL LOW (ref 135–145)
Total Bilirubin: 0.4 mg/dL (ref 0.0–1.2)
Total Protein: 8.5 g/dL — ABNORMAL HIGH (ref 6.5–8.1)

## 2023-06-07 LAB — URINALYSIS, ROUTINE W REFLEX MICROSCOPIC
Bilirubin Urine: NEGATIVE
Glucose, UA: NEGATIVE mg/dL
Hgb urine dipstick: NEGATIVE
Ketones, ur: NEGATIVE mg/dL
Nitrite: NEGATIVE
Protein, ur: NEGATIVE mg/dL
Specific Gravity, Urine: 1.013 (ref 1.005–1.030)
pH: 5 (ref 5.0–8.0)

## 2023-06-07 LAB — CBC
HCT: 31.1 % — ABNORMAL LOW (ref 36.0–46.0)
Hemoglobin: 9.7 g/dL — ABNORMAL LOW (ref 12.0–15.0)
MCH: 26.7 pg (ref 26.0–34.0)
MCHC: 31.2 g/dL (ref 30.0–36.0)
MCV: 85.7 fL (ref 80.0–100.0)
Platelets: 450 10*3/uL — ABNORMAL HIGH (ref 150–400)
RBC: 3.63 MIL/uL — ABNORMAL LOW (ref 3.87–5.11)
RDW: 12.8 % (ref 11.5–15.5)
WBC: 10 10*3/uL (ref 4.0–10.5)
nRBC: 0 % (ref 0.0–0.2)

## 2023-06-07 LAB — RESP PANEL BY RT-PCR (RSV, FLU A&B, COVID)  RVPGX2
Influenza A by PCR: NEGATIVE
Influenza B by PCR: NEGATIVE
Resp Syncytial Virus by PCR: NEGATIVE
SARS Coronavirus 2 by RT PCR: NEGATIVE

## 2023-06-07 LAB — LIPASE, BLOOD: Lipase: 26 U/L (ref 11–51)

## 2023-06-07 MED ORDER — PANTOPRAZOLE SODIUM 20 MG PO TBEC
20.0000 mg | DELAYED_RELEASE_TABLET | Freq: Every day | ORAL | 2 refills | Status: DC
Start: 2023-06-07 — End: 2023-06-12
  Filled 2023-06-07: qty 30, 30d supply, fill #0

## 2023-06-07 MED ORDER — IOHEXOL 300 MG/ML  SOLN
100.0000 mL | Freq: Once | INTRAMUSCULAR | Status: AC | PRN
  Administered 2023-06-07: 85 mL via INTRAVENOUS

## 2023-06-07 MED ORDER — PANTOPRAZOLE SODIUM 40 MG IV SOLR
40.0000 mg | Freq: Once | INTRAVENOUS | Status: AC
  Administered 2023-06-07: 40 mg via INTRAVENOUS
  Filled 2023-06-07: qty 10

## 2023-06-07 MED ORDER — ALUM & MAG HYDROXIDE-SIMETH 200-200-20 MG/5ML PO SUSP
30.0000 mL | Freq: Once | ORAL | Status: AC
  Administered 2023-06-07: 30 mL via ORAL
  Filled 2023-06-07: qty 30

## 2023-06-07 MED ORDER — ONDANSETRON HCL 4 MG/2ML IJ SOLN
4.0000 mg | Freq: Once | INTRAMUSCULAR | Status: AC
  Administered 2023-06-07: 4 mg via INTRAVENOUS
  Filled 2023-06-07: qty 2

## 2023-06-07 MED ORDER — LIDOCAINE VISCOUS HCL 2 % MT SOLN
15.0000 mL | Freq: Once | OROMUCOSAL | Status: AC
  Administered 2023-06-07: 15 mL via ORAL
  Filled 2023-06-07: qty 15

## 2023-06-07 MED ORDER — HYDROMORPHONE HCL 1 MG/ML IJ SOLN
0.5000 mg | Freq: Once | INTRAMUSCULAR | Status: AC
  Administered 2023-06-07: 0.5 mg via INTRAVENOUS
  Filled 2023-06-07: qty 1

## 2023-06-07 NOTE — ED Notes (Signed)
PO challenge per MD request. Pt given water and crackers.

## 2023-06-07 NOTE — ED Provider Notes (Signed)
[facility]GENC[facility] DEPART[facility] AT [facility] Provider Note   CSN: [id number] Arrival date & time: 06/07/23  1639     History  Chief Complaint  Patient presents with   Abdominal Pain   Emesis    Julia Knight is a 73 y.o. female.  73 year old female with history of colon cancer in remission, diabetes, hyperlipidemia, and back surgery who presents emergency department abdominal pain and nausea and vomiting.  Says that yesterday she started experiencing severe epigastric abdominal pain.  It is constant.  Also has had an episode of nonbloody nonbilious emesis.  Said that yesterday she also had multiple loose stools.  On 05/16/2023 she had lumbar spinal fusion.  No alcohol use.  No history of pancreatitis or ulcers.  Did have abdominal surgery from her colon cancer as well.  History obtained via Spanish interpreter.       Home Medications Prior to Admission medications   Medication Sig Start Date End Date Taking? Authorizing Provider  pantoprazole (PROTONIX) 20 MG tablet Take 1 tablet (20 mg total) by mouth daily. 06/07/23  Yes Rondel Baton, MD  acetaminophen (TYLENOL) 500 MG tablet Take 2 tablets (1,000 mg total) by mouth every 8 (eight) hours for 21 days. 05/21/23 06/11/23  London Sheer, MD  atorvastatin (LIPITOR) 40 MG tablet Take 1 tablet (40 mg total) by mouth at bedtime. Patient taking differently: Take 40 mg by mouth in the morning. 12/13/22   de [place], Buren Kos, MD  calcium carbonate (OSCAL) 1500 (600 Ca) MG TABS tablet Take 600 mg of elemental calcium by mouth in the morning.    [provider]  glipiZIDE (GLUCOTROL) 5 MG tablet Take 1 tablet (5 mg total) by mouth 2 (two) times daily before a meal. Patient taking differently: Take 5 mg by mouth every evening. 12/13/22   [place], Buren Kos, MD  Glucosamine Sulfate 500 MG TABS Take 500 mg by mouth in the morning and at bedtime. 05/09/13   [provider]  metFORMIN (GLUCOPHAGE) 1000 MG  tablet Take 1 tablet (1,000 mg total) by mouth 2 (two) times daily (in the morning and evening with a meal). 01/30/23   de [place], Raymond J, MD  Multiple Vitamin (MULTIVITAMIN WITH MINERALS) TABS tablet Take 1 tablet by mouth in the morning.    [provider]  ondansetron (ZOFRAN) 4 MG tablet Take 1 tablet (4 mg total) by mouth every 8 (eight) hours as needed for nausea or vomiting. 11/24/22   London Sheer, MD      Allergies    Patient has no known allergies.    Review of Systems   Review of Systems  Physical Exam Updated Vital Signs BP (!) 150/66   Pulse 84   Temp 97.6 F (36.4 C)   Resp 16   Ht 5\' 1"  (1.549 m)   Wt 78.9 kg   SpO2 98%   BMI 32.87 kg/m  Physical Exam Vitals and nursing note reviewed.  Constitutional:      General: She is not in acute distress.    Appearance: She is well-developed.  HENT:     Head: Normocephalic and atraumatic.     Right Ear: External ear normal.     Left Ear: External ear normal.     Nose: Nose normal.  Eyes:     Extraocular Movements: Extraocular movements intact.     Conjunctiva/sclera: Conjunctivae normal.     Pupils: Pupils are equal, round, and reactive to light.  Cardiovascular:  Heart sounds: No murmur heard. Pulmonary:     Effort: Pulmonary effort is normal. No respiratory distress.  Abdominal:     General: Abdomen is flat. There is no distension.     Palpations: Abdomen is soft. There is no mass.     Tenderness: There is abdominal tenderness (Epigastrium). There is no guarding.     Comments: Negative Murphy sign  Musculoskeletal:     Cervical back: Normal range of motion and neck supple.     Right lower leg: No edema.     Left lower leg: No edema.  Skin:    General: Skin is warm and dry.  Neurological:     Mental Status: She is alert and oriented to person, place, and time. Mental status is at baseline.  Psychiatric:        Mood and Affect: Mood normal.     ED Results / Procedures / Treatments    Labs (all labs ordered are listed, but only abnormal results are displayed) Labs Reviewed  COMPREHENSIVE METABOLIC PANEL - Abnormal; Notable for the following components:      Result Value   Sodium 133 (*)    Glucose, Bld 195 (*)    Creatinine, Ser 1.27 (*)    Total Protein 8.5 (*)    AST 9 (*)    Alkaline Phosphatase 178 (*)    GFR, Estimated 45 (*)    All other components within normal limits  CBC - Abnormal; Notable for the following components:   RBC 3.63 (*)    Hemoglobin 9.7 (*)    HCT 31.1 (*)    Platelets 450 (*)    All other components within normal limits  URINALYSIS, ROUTINE W REFLEX MICROSCOPIC - Abnormal; Notable for the following components:   Leukocytes,Ua TRACE (*)    Bacteria, UA RARE (*)    All other components within normal limits  RESP PANEL BY RT-PCR (RSV, FLU A&B, COVID)  RVPGX2  LIPASE, BLOOD    EKG EKG Interpretation Date/Time:  Wednesday June 07 2023 20:20:36 EST Ventricular Rate:  83 PR Interval:  152 QRS Duration:  93 QT Interval:  370 QTC Calculation: 435 R Axis:   38  Text Interpretation: Sinus rhythm Low voltage, precordial leads Confirmed by Vonita Moss [phone]) on 06/07/2023 8:55:00 PM  Radiology CT ABDOMEN PELVIS W CONTRAST Result Date: 06/07/2023 CLINICAL DATA:  Abdominal pain with nausea and vomiting. Colon cancer. EXAM: CT ABDOMEN AND PELVIS WITH CONTRAST TECHNIQUE: Multidetector CT imaging of the abdomen and pelvis was performed using the standard protocol following bolus administration of intravenous contrast. RADIATION DOSE REDUCTION: This exam was performed according to the departmental dose-optimization program which includes automated exposure control, adjustment of the mA and/or kV according to patient size and/or use of iterative reconstruction technique. CONTRAST:  85mL OMNIPAQUE IOHEXOL 300 MG/ML  SOLN COMPARISON:  CT chest abdomen and pelvis 12/07/2022 FINDINGS: Lower chest: No acute abnormality. Hepatobiliary: No focal  liver abnormality is seen. No gallstones, gallbladder wall thickening, or biliary dilatation. Pancreas: Unremarkable. No pancreatic ductal dilatation or surrounding inflammatory changes. Spleen: Normal in size without focal abnormality. Adrenals/Urinary Tract: Adrenal glands are unremarkable. Kidneys are normal, without renal calculi, focal lesion, or hydronephrosis. Bladder is unremarkable. Stomach/Bowel: Right hemicolectomy changes are present. There are no dilated bowel loops. There is no wall thickening or inflammation. Stomach is within normal limits. Vascular/Lymphatic: Aortic atherosclerosis. No enlarged abdominal or pelvic lymph nodes. Reproductive: Uterus and bilateral adnexa are unremarkable. Other: No abdominal wall hernia or abnormality. No abdominopelvic  ascites. Musculoskeletal: L2-S1 posterior fusion hardware is present. Multilevel degenerative changes affect the spine. IMPRESSION: 1. No acute localizing process in the abdomen or pelvis. 2. Right hemicolectomy changes. 3. Aortic atherosclerosis. Aortic Atherosclerosis (ICD10-I70.0). Electronically Signed   By: Darliss Cheney M.D.   On: 06/07/2023 22:03    Procedures Procedures    Medications Ordered in ED Medications  HYDROmorphone (DILAUDID) injection 0.5 mg (0.5 mg Intravenous Given 06/07/23 2017)  ondansetron (ZOFRAN) injection 4 mg (4 mg Intravenous Given 06/07/23 2017)  iohexol (OMNIPAQUE) 300 MG/ML solution 100 mL (85 mLs Intravenous Contrast Given 06/07/23 2027)  pantoprazole (PROTONIX) injection 40 mg (40 mg Intravenous Given 06/07/23 2043)  alum & mag hydroxide-simeth (MAALOX/MYLANTA) 200-200-20 MG/5ML suspension 30 mL (30 mLs Oral Given 06/07/23 2055)    And  lidocaine (XYLOCAINE) 2 % viscous mouth solution 15 mL (15 mLs Oral Given 06/07/23 2055)    ED Course/ Medical Decision Making/ A&P                                 Medical Decision Making Amount and/or Complexity of Data Reviewed Labs: ordered. Radiology:  ordered.  Risk OTC drugs. Prescription drug management.   Sydne Krahl is a 73 y.o. female with comorbidities that complicate the patient evaluation including colon cancer in remission, diabetes, hyperlipidemia, and back surgery who presents emergency department abdominal pain and nausea and vomiting.    Initial Ddx:  Gastritis, peptic ulcer disease, pancreatitis, cholecystitis, cancer, bowel obstruction, ileus  MDM/Course:  Patient presents to the emergency department with epigastric abdominal pain and nausea and vomiting.  Did recently have lumbar spinal fusion.  Has some epigastric tenderness to palpation on exam.  CT scan was obtained which did not show acute abnormality.  Lab work was reassuring which included normal lipase.  She had a COVID and flu sent from triage that was normal.  Was given Zofran and Protonix upon re-evaluation pain was under control and she was tolerating p.o.  Suspect that she has either gastritis or peptic ulcer disease and will be continued on Protonix at home.  Will have her follow-up with her primary doctor in several days.  Could potentially benefit from H. pylori testing.  This patient presents to the ED for concern of complaints listed in HPI, this involves an extensive number of treatment options, and is a complaint that carries with it a high risk of complications and morbidity. Disposition including potential need for admission considered.   Dispo: DC Home. Return precautions discussed including, but not limited to, those listed in the AVS. Allowed pt time to ask questions which were answered fully prior to dc.  Additional history obtained from daughter Records reviewed Outpatient Clinic Notes The following labs were independently interpreted: Chemistry and show CKD I independently reviewed the following imaging with scope of interpretation limited to determining acute life threatening conditions related to emergency care: CT Abdomen/Pelvis  and agree with the radiologist interpretation with the following exceptions: none I personally reviewed and interpreted cardiac monitoring: normal sinus rhythm  I personally reviewed and interpreted the pt's EKG: see above for interpretation  I have reviewed the patients home medications and made adjustments as needed Social Determinants of health:  Spanish speaking only  Portions of this note were generated with Scientist, clinical (histocompatibility and immunogenetics). Dictation errors may occur despite best attempts at proofreading.     Final Clinical Impression(s) / ED Diagnoses Final diagnoses:  Gastritis without bleeding, unspecified chronicity,  unspecified gastritis type  Nausea and vomiting, unspecified vomiting type    Rx / DC Orders ED Discharge Orders          Ordered    pantoprazole (PROTONIX) 20 MG tablet  Daily        06/07/23 2328              Rondel Baton, MD 06/07/23 2349

## 2023-06-07 NOTE — ED Notes (Addendum)
PO challenge- Pt denies nausea, but reports some abdominal pain after eating crackers and water. MD notified.

## 2023-06-07 NOTE — ED Triage Notes (Signed)
Pt via pov from home with abdominal pain and inability to tolerate food or water since last night. Pt believes that this is related to back surgery a few weeks ago but the pain began last night around 1900 hours. She has not had any pain or emesis until last night. Pt points to upper/mid abdomen, not quite to epigastric area. Pt alert & oriented, nad noted.

## 2023-06-07 NOTE — Discharge Instructions (Signed)
Lo atendieron por irritacin estomacal (gastritis) en el departamento de emergencias.   En casa, comience a tomar el Protonix que le recetaron.    Consulte su MyChart en lnea para conocer los resultados de cualquier prueba que no haya dado resultado cuando sali del departamento de Sports administrator.   Haga un seguimiento con su mdico de atencin primaria en 2-3 das con respecto a su visita.  Hable con ellos para ver si necesita una evaluacin para detectar una infeccin llamada H. pylori.  Regrese inmediatamente al departamento de emergencias si experimenta cualquiera de los siguientes sntomas: empeoramiento del dolor, vmitos o cualquier otro sntoma preocupante.    Gracias por visitar nuestro Departamento de [address]. Fue un placer atenderte hoy.  -----  You were seen for your stomach irritation (gastritis) in the emergency department.   At home, please start taking the Protonix you are prescribed.    Check your MyChart online for the results of any tests that had not resulted by the time you left the emergency department.   Follow-up with your primary doctor in 2-3 days regarding your visit.  Talk to them to see if you need evaluation for an infection called H. pylori.  Return immediately to the emergency department if you experience any of the following: Worsening pain, vomiting, or any other concerning symptoms.    Thank you for visiting our Emergency Department. It was a pleasure taking care of you today.

## 2023-06-08 ENCOUNTER — Other Ambulatory Visit: Payer: Self-pay

## 2023-06-12 ENCOUNTER — Other Ambulatory Visit: Payer: Self-pay

## 2023-06-12 ENCOUNTER — Ambulatory Visit (HOSPITAL_BASED_OUTPATIENT_CLINIC_OR_DEPARTMENT_OTHER): Payer: Self-pay | Admitting: Family Medicine

## 2023-06-12 ENCOUNTER — Emergency Department (HOSPITAL_BASED_OUTPATIENT_CLINIC_OR_DEPARTMENT_OTHER): Payer: Self-pay

## 2023-06-12 ENCOUNTER — Encounter (HOSPITAL_BASED_OUTPATIENT_CLINIC_OR_DEPARTMENT_OTHER): Payer: Self-pay | Admitting: *Deleted

## 2023-06-12 ENCOUNTER — Emergency Department (HOSPITAL_BASED_OUTPATIENT_CLINIC_OR_DEPARTMENT_OTHER)
Admission: EM | Admit: 2023-06-12 | Discharge: 2023-06-12 | Disposition: A | Discharge status: Home / Self Care | Payer: Self-pay | Attending: Emergency Medicine | Admitting: Emergency Medicine

## 2023-06-12 DIAGNOSIS — R1010 Upper abdominal pain, unspecified: Secondary | ICD-10-CM | POA: Insufficient documentation

## 2023-06-12 LAB — COMPREHENSIVE METABOLIC PANEL
ALT: 7 U/L (ref 0–44)
AST: 8 U/L — ABNORMAL LOW (ref 15–41)
Albumin: 4 g/dL (ref 3.5–5.0)
Alkaline Phosphatase: 164 U/L — ABNORMAL HIGH (ref 38–126)
Anion gap: 10 (ref 5–15)
BUN: 18 mg/dL (ref 8–23)
CO2: 25 mmol/L (ref 22–32)
Calcium: 9.6 mg/dL (ref 8.9–10.3)
Chloride: 101 mmol/L (ref 98–111)
Creatinine, Ser: 0.98 mg/dL (ref 0.44–1.00)
GFR, Estimated: >60 mL/min (ref >60)
Glucose, Bld: 221 mg/dL — ABNORMAL HIGH (ref 70–99)
Potassium: 4.4 mmol/L (ref 3.5–5.1)
Sodium: 136 mmol/L (ref 135–145)
Total Bilirubin: 0.3 mg/dL (ref 0.0–1.2)
Total Protein: 7.8 g/dL (ref 6.5–8.1)

## 2023-06-12 LAB — CBC
HCT: 31.7 % — ABNORMAL LOW (ref 36.0–46.0)
Hemoglobin: 9.9 g/dL — ABNORMAL LOW (ref 12.0–15.0)
MCH: 27 pg (ref 26.0–34.0)
MCHC: 31.2 g/dL (ref 30.0–36.0)
MCV: 86.6 fL (ref 80.0–100.0)
Platelets: 341 10*3/uL (ref 150–400)
RBC: 3.66 MIL/uL — ABNORMAL LOW (ref 3.87–5.11)
RDW: 12.9 % (ref 11.5–15.5)
WBC: 11.2 10*3/uL — ABNORMAL HIGH (ref 4.0–10.5)
nRBC: 0 % (ref 0.0–0.2)

## 2023-06-12 MED ORDER — FAMOTIDINE IN NACL 20-0.9 MG/50ML-% IV SOLN
20.0000 mg | Freq: Once | INTRAVENOUS | Status: AC
  Administered 2023-06-12: 20 mg via INTRAVENOUS
  Filled 2023-06-12: qty 50

## 2023-06-12 MED ORDER — FAMOTIDINE 20 MG PO TABS
20.0000 mg | ORAL_TABLET | Freq: Two times a day (BID) | ORAL | 0 refills | Status: DC
  Filled 2023-06-12: qty 30, 15d supply, fill #0

## 2023-06-12 MED ORDER — ALUM & MAG HYDROXIDE-SIMETH 200-200-20 MG/5ML PO SUSP
30.0000 mL | Freq: Once | ORAL | Status: AC
  Administered 2023-06-12: 30 mL via ORAL
  Filled 2023-06-12: qty 30

## 2023-06-12 MED ORDER — IOHEXOL 350 MG/ML SOLN
100.0000 mL | Freq: Once | INTRAVENOUS | Status: AC | PRN
  Administered 2023-06-12: 100 mL via INTRAVENOUS

## 2023-06-12 MED ORDER — MORPHINE SULFATE (PF) 4 MG/ML IV SOLN
4.0000 mg | Freq: Once | INTRAVENOUS | Status: AC
  Administered 2023-06-12: 4 mg via INTRAVENOUS
  Filled 2023-06-12: qty 1

## 2023-06-12 MED ORDER — ONDANSETRON HCL 4 MG/2ML IJ SOLN
4.0000 mg | Freq: Once | INTRAMUSCULAR | Status: AC
  Administered 2023-06-12: 4 mg via INTRAVENOUS
  Filled 2023-06-12: qty 2

## 2023-06-12 MED ORDER — SODIUM CHLORIDE 0.9 % IV BOLUS
1000.0000 mL | Freq: Once | INTRAVENOUS | Status: AC
Start: 2023-06-12 — End: 2023-06-12
  Administered 2023-06-12: 1000 mL via INTRAVENOUS

## 2023-06-12 NOTE — ED Triage Notes (Signed)
Pt is here for reevaluation of upper abdominal pain and black stool. Pt has been feeling weak, she was seen for this 1/29 and d/c

## 2023-06-12 NOTE — Discharge Instructions (Signed)
Try pepcid or tagamet up to twice a day.  Try to avoid things that may make this worse, most commonly these are spicy foods tomato based products fatty foods chocolate and peppermint.  Alcohol and tobacco can also make this worse.  Return to the emergency department for sudden worsening pain fever or inability to eat or drink.  

## 2023-06-12 NOTE — ED Provider Notes (Signed)
[name] AT [facility] Provider Note   CSN: [id number] Arrival date & time: 06/12/23  1545     History  Chief Complaint  Patient presents with   Abdominal Pain   GI Bleeding    Julia Knight is a 73 y.o. female.  73 yo F with a chief complaints of abdominal pain.  This is diffuse to her upper abdomen.  Going on for a few days.  He was seen in the ED recently for this and had laboratory evaluation and CT imaging.  Since then she feels like her pain is worsened.  She called her doctor today and they encouraged her to come to the ED for evaluation.  Pain worse with eating and drinking.  No reported fevers.  No diarrhea.   Abdominal Pain      Home Medications Prior to Admission medications   Medication Sig Start Date End Date Taking? Authorizing Provider  famotidine (PEPCID) 20 MG tablet Take 1 tablet (20 mg total) by mouth 2 (two) times daily. 06/12/23  Yes Melene Plan, DO  atorvastatin (LIPITOR) 40 MG tablet Take 1 tablet (40 mg total) by mouth at bedtime. Patient taking differently: Take 40 mg by mouth in the morning. 12/13/22   de [place], Buren Kos, MD  calcium carbonate (OSCAL) 1500 (600 Ca) MG TABS tablet Take 600 mg of elemental calcium by mouth in the morning.    [provider]  glipiZIDE (GLUCOTROL) 5 MG tablet Take 1 tablet (5 mg total) by mouth 2 (two) times daily before a meal. Patient taking differently: Take 5 mg by mouth every evening. 12/13/22   de [place], Buren Kos, MD  Glucosamine Sulfate 500 MG TABS Take 500 mg by mouth in the morning and at bedtime. 05/09/13   [provider]  metFORMIN (GLUCOPHAGE) 1000 MG tablet Take 1 tablet (1,000 mg total) by mouth 2 (two) times daily (in the morning and evening with a meal). 01/30/23   de [place], Raymond J, MD  Multiple Vitamin (MULTIVITAMIN WITH MINERALS) TABS tablet Take 1 tablet by mouth in the morning.    [provider]  ondansetron (ZOFRAN) 4 MG tablet Take 1  tablet (4 mg total) by mouth every 8 (eight) hours as needed for nausea or vomiting. 11/24/22   London Sheer, MD      Allergies    Patient has no known allergies.    Review of Systems   Review of Systems  Gastrointestinal:  Positive for abdominal pain.    Physical Exam Updated Vital Signs BP (!) 134/58   Pulse 76   Temp 98 F (36.7 C) (Oral)   Resp 18   Ht 5\' 1"  (1.549 m)   Wt 78.9 kg   SpO2 100%   BMI 32.87 kg/m  Physical Exam Vitals and nursing note reviewed.  Constitutional:      General: She is not in acute distress.    Appearance: She is well-developed. She is not diaphoretic.  HENT:     Head: Normocephalic and atraumatic.  Eyes:     Pupils: Pupils are equal, round, and reactive to light.  Cardiovascular:     Rate and Rhythm: Normal rate and regular rhythm.     Heart sounds: No murmur heard.    No friction rub. No gallop.  Pulmonary:     Effort: Pulmonary effort is normal.     Breath sounds: No wheezing or rales.  Abdominal:     General: There is no distension.  Palpations: Abdomen is soft.     Tenderness: There is abdominal tenderness.     Comments: Diffuse upper abdominal discomfort without obvious focality.  Musculoskeletal:        General: No tenderness.     Cervical back: Normal range of motion and neck supple.  Skin:    General: Skin is warm and dry.  Neurological:     Mental Status: She is alert and oriented to person, place, and time.  Psychiatric:        Behavior: Behavior normal.     ED Results / Procedures / Treatments   Labs (all labs ordered are listed, but only abnormal results are displayed) Labs Reviewed  COMPREHENSIVE METABOLIC PANEL - Abnormal; Notable for the following components:      Result Value   Glucose, Bld 221 (*)    AST 8 (*)    Alkaline Phosphatase 164 (*)    All other components within normal limits  CBC - Abnormal; Notable for the following components:   WBC 11.2 (*)    RBC 3.66 (*)    Hemoglobin 9.9 (*)     HCT 31.7 (*)    All other components within normal limits  POC OCCULT BLOOD, ED    EKG None  Radiology CT Angio Abd/Pel W and/or Wo Contrast Result Date: 06/12/2023 CLINICAL DATA:  Mesenteric ischemia, acute. Abdominal pain. GI bleeding. EXAM: CTA ABDOMEN AND PELVIS WITHOUT AND WITH CONTRAST TECHNIQUE: Multidetector CT imaging of the abdomen and pelvis was performed using the standard protocol during bolus administration of intravenous contrast. Multiplanar reconstructed images and MIPs were obtained and reviewed to evaluate the vascular anatomy. RADIATION DOSE REDUCTION: This exam was performed according to the departmental dose-optimization program which includes automated exposure control, adjustment of the mA and/or kV according to patient size and/or use of iterative reconstruction technique. CONTRAST:  OMNIPAQUE IOHEXOL 350 MG/ML SOLN COMPARISON:  CT abdomen pelvis 06/07/2023 FINDINGS: VASCULAR No extravasation of intravenous contrast within the bowel lumen. Aorta: Mild atherosclerotic plaque. Normal caliber aorta without aneurysm, dissection, vasculitis or significant stenosis. Celiac: Mild atherosclerotic plaque. Patent without evidence of aneurysm, dissection, vasculitis or significant stenosis. SMA: Mild atherosclerotic plaque. Patent without evidence of aneurysm, dissection, vasculitis or significant stenosis. Renals: Both renal arteries are patent without evidence of aneurysm, dissection, vasculitis, fibromuscular dysplasia or significant stenosis. IMA: Patent without evidence of aneurysm, dissection, vasculitis or significant stenosis. Inflow: Patent without evidence of aneurysm, dissection, vasculitis or significant stenosis. Proximal Outflow: Bilateral common femoral and visualized portions of the superficial and profunda femoral arteries are patent without evidence of aneurysm, dissection, vasculitis or significant stenosis. Veins: The portal, splenic, superior mesenteric veins are  patent. Review of the MIP images confirms the above findings. NON-VASCULAR Lower chest: Tiny hiatal hernia.  No acute abnormality. Hepatobiliary: No focal liver abnormality. No gallstones, gallbladder wall thickening, or pericholecystic fluid. No biliary dilatation. Pancreas: No focal lesion. Normal pancreatic contour. No surrounding inflammatory changes. No main pancreatic ductal dilatation. Spleen: Normal in size without focal abnormality. Adrenals/Urinary Tract: No adrenal nodule bilaterally. Bilateral kidneys enhance symmetrically. No hydronephrosis. No hydroureter. The urinary bladder is unremarkable. Stomach/Bowel: Stomach is within normal limits. No evidence of bowel wall thickening or dilatation. Partial right colectomy. Lymphatic: No lymphadenopathy. Reproductive: Uterus and bilateral adnexa are unremarkable. Other: No intraperitoneal free fluid. No intraperitoneal free gas. No organized fluid collection. Musculoskeletal: No abdominal wall hernia or abnormality. No suspicious lytic or blastic osseous lesions. No acute displaced fracture. L1-L5 posterolateral interbody surgical hardware fusion. Associated multilevel  severe degenerative changes of the spine. Grade 1 anterolisthesis of L3 on L4. Posterior disc osteophyte complex formation of the L1-L2 and L2-L3 levels. No severe osseous neural foraminal or central canal stenosis. IMPRESSION: VASCULAR 1. No CT evidence of gastrointestinal hemorrhage. 2.  Aortic Atherosclerosis (ICD10-I70.0)-mild. NON-VASCULAR 1. No acute intra-abdominal or intrapelvic abnormality patient status post partial colectomy. Electronically Signed   By: Tish Frederickson M.D.   On: 06/12/2023 21:21    Procedures Procedures    Medications Ordered in ED Medications  morphine (PF) 4 MG/ML injection 4 mg (4 mg Intravenous Given 06/12/23 2025)  ondansetron (ZOFRAN) injection 4 mg (4 mg Intravenous Given 06/12/23 2025)  sodium chloride 0.9 % bolus 1,000 mL (0 mLs Intravenous Stopped  06/12/23 2214)  iohexol (OMNIPAQUE) 350 MG/ML injection 100 mL (100 mLs Intravenous Contrast Given 06/12/23 2054)  alum & mag hydroxide-simeth (MAALOX/MYLANTA) 200-200-20 MG/5ML suspension 30 mL (30 mLs Oral Given 06/12/23 2138)  famotidine (PEPCID) IVPB 20 mg premix (0 mg Intravenous Stopped 06/12/23 2214)    ED Course/ Medical Decision Making/ A&P                                 Medical Decision Making Amount and/or Complexity of Data Reviewed Labs: ordered. Radiology: ordered.  Risk OTC drugs. Prescription drug management.   73 yo F chief complaint of abdominal pain.  This been going on for few days now.  Has been seen in the ED previously had CT imaging without obvious etiology as well as blood work.  Blood work repeated here today.  Hemoglobin unchanged.  No significant electrolyte abnormalities LFTs and lipase are unremarkable.  Will repeat CT imaging here.  Will obtain angiogram to assess for possible mesenteric ischemia.  Reassess.  CTA without obvious acute finding.  Patient feeling mildly better.  Lab work without significant finding no LFT elevation lipase normal.  She is able to tolerate by mouth.  Still having some ongoing discomfort.  She will start on Protonix yesterday, will switch to Pepcid.  PCP follow-up.  11:17 PM:  I have discussed the diagnosis/risks/treatment options with the patient and family.  Evaluation and diagnostic testing in the emergency department does not suggest an emergent condition requiring admission or immediate intervention beyond what has been performed at this time.  They will follow up with PCP. We also discussed returning to the ED immediately if new or worsening sx occur. We discussed the sx which are most concerning (e.g., sudden worsening pain, fever, inability to tolerate by mouth) that necessitate immediate return. Medications administered to the patient during their visit and any new prescriptions provided to the patient are listed  below.  Medications given during this visit Medications  morphine (PF) 4 MG/ML injection 4 mg (4 mg Intravenous Given 06/12/23 2025)  ondansetron (ZOFRAN) injection 4 mg (4 mg Intravenous Given 06/12/23 2025)  sodium chloride 0.9 % bolus 1,000 mL (0 mLs Intravenous Stopped 06/12/23 2214)  iohexol (OMNIPAQUE) 350 MG/ML injection 100 mL (100 mLs Intravenous Contrast Given 06/12/23 2054)  alum & mag hydroxide-simeth (MAALOX/MYLANTA) 200-200-20 MG/5ML suspension 30 mL (30 mLs Oral Given 06/12/23 2138)  famotidine (PEPCID) IVPB 20 mg premix (0 mg Intravenous Stopped 06/12/23 2214)     The patient appears reasonably screen and/or stabilized for discharge and I doubt any other medical condition or other [facility] requiring further screening, evaluation, or treatment in the ED at this time prior to discharge.  Final Clinical Impression(s) / ED Diagnoses Final diagnoses:  Upper abdominal pain    Rx / DC Orders ED Discharge Orders          Ordered    famotidine (PEPCID) 20 MG tablet  2 times daily        06/12/23 2242              Melene Plan, DO 06/12/23 2318

## 2023-06-12 NOTE — Telephone Encounter (Signed)
   Chief Complaint: bloody/black  stool Symptoms: abd pain  Frequency: constant   Disposition: [x] ED /[] Urgent Care (no appt availability in office) / [] Appointment(In office/virtual)/ []  [facility]/ [] Home Care/ [] Refused Recommended Disposition /[] [facility]/ []  Follow-up with PCP Additional Notes: Pt son Elita Quick called on behalf of pt. Pt is reporting black bloody stools since Friday. Pt was seen at ED on 1/29 and has history of colon cancer. Pain has intensified. Pt can't eat or sleep.  Pt stated pain is 10/10. Per protocol, RN  advised pt to go to ED. Elita Quick will take pt. RN gave care advice and pt verbalized understanding.                Copied from CRM [id number]. Topic: Clinical - Red Word Triage >> Jun 12, 2023  2:26 PM Clayton Bibles wrote: Red Word that prompted transfer to Nurse Triage: bad stomach (pain is a 10), Her stools are black with blood. Reason for Disposition  Patient sounds very sick or weak to the triager  Answer Assessment - Initial Assessment Questions 1. COLOR: "What color is it?" "Is that color in part or all of the stool?"     Black and bloody  2. ONSET: "When was the unusual color first noted?"     Since Friday night  3. CAUSE: "Have you eaten any food or taken any medicine of this color?" Note: See listing in Background Information section.      no 4. OTHER SYMPTOMS: "Do you have any other symptoms?" (e.g., abdomen pain, diarrhea, jaundice, fever).     Abdomen pain  Protocols used: Stools - Unusual Color-A-AH

## 2023-06-13 ENCOUNTER — Other Ambulatory Visit: Payer: Self-pay

## 2023-06-14 NOTE — Telephone Encounter (Signed)
 Patient went to the ED 2/3 due to having abdominal pain and having blood in stools.  Routing to [employer] as an Financial planner.

## 2023-06-20 ENCOUNTER — Ambulatory Visit (HOSPITAL_BASED_OUTPATIENT_CLINIC_OR_DEPARTMENT_OTHER): Payer: Self-pay | Admitting: Family Medicine

## 2023-06-21 ENCOUNTER — Encounter (HOSPITAL_BASED_OUTPATIENT_CLINIC_OR_DEPARTMENT_OTHER): Payer: Self-pay | Admitting: Family Medicine

## 2023-06-21 ENCOUNTER — Ambulatory Visit (INDEPENDENT_AMBULATORY_CARE_PROVIDER_SITE_OTHER): Payer: Self-pay | Admitting: Family Medicine

## 2023-06-21 ENCOUNTER — Other Ambulatory Visit: Payer: Self-pay

## 2023-06-21 VITALS — BP 154/63 | HR 94 | Ht 61.0 in | Wt 166.4 lb

## 2023-06-21 DIAGNOSIS — E1165 Type 2 diabetes mellitus with hyperglycemia: Secondary | ICD-10-CM

## 2023-06-21 DIAGNOSIS — I10 Essential (primary) hypertension: Secondary | ICD-10-CM

## 2023-06-21 DIAGNOSIS — R112 Nausea with vomiting, unspecified: Secondary | ICD-10-CM

## 2023-06-21 DIAGNOSIS — Z7984 Long term (current) use of oral hypoglycemic drugs: Secondary | ICD-10-CM

## 2023-06-21 MED ORDER — FAMOTIDINE 20 MG PO TABS
20.0000 mg | ORAL_TABLET | Freq: Two times a day (BID) | ORAL | 3 refills | Status: DC
  Filled 2023-06-21: qty 60, 30d supply, fill #0

## 2023-06-21 MED ORDER — METFORMIN HCL ER 500 MG PO TB24
500.0000 mg | ORAL_TABLET | Freq: Two times a day (BID) | ORAL | 3 refills | Status: DC
  Filled 2023-06-21: qty 60, 30d supply, fill #0

## 2023-06-21 MED ORDER — AMLODIPINE BESYLATE 5 MG PO TABS
5.0000 mg | ORAL_TABLET | Freq: Every day | ORAL | 3 refills | Status: DC
  Filled 2023-06-21: qty 90, 90d supply, fill #0

## 2023-06-21 NOTE — Progress Notes (Signed)
Acute Care Office Visit  Subjective:   Julia Knight June 22, 1950 06/21/2023  Chief Complaint  Patient presents with   Abdominal Pain    Stomach pain x 2 weeks, ER Twice (acid)  fever and nausea, headaches, dizziness     HPI: FOLLOW UP GASTRITIS:  Julia Knight is a 73 year old female with history of type 2 diabetes, primary hypertension, colon cancer in remission, SP lumbar spinal fusion and CKD stage IIIa presenting for follow-up of hematemesis with nausea ongoing for approximately 3 weeks.  Patient is accompanied by her family members today.  She states on 06/07/2023 she went to the ER due to severe epigastric pain and vomiting.  She recently had a spinal fusion on 05/16/2023.  She had a workup with CT that was unremarkable for acute abnormality.  Normal lipase, slight elevation in LFTs.  She was given Protonix and Zofran and had improvement of symptoms and diagnosed with possible gastritis or PUD.  She was sent home with Protonix daily and reports that her symptoms actually worsened with starting the Protonix.  She returned to the ER on June 12, 2023 for ongoing abdominal pain, dark bloody stools, and nausea and vomiting.  White count was slightly elevated at 11.2, alk phos 164 hemoglobin at 9.9 and hematocrit 31.7.  Her CT abdomen pelvis was repeated with angiogram for possible mesenteric ischemia.  CT was unremarkable.  Patient was given medication for pain relief, fluids, Zofran and GI cocktail and Pepcid with significant relief of symptoms.  She was sent home on Pepcid and recommended to follow-up with PCP.  She has been taking the Pepcid twice daily and reports significant relief in her symptoms, but is still having nightly vomiting of a scant to trace amount with blood intermittently.  She states she is vomiting approximately 2-3 times at night especially after eating smaller portions.  She did notice increased nausea and diarrhea with starting of metformin but  has since been able to tolerate.  HYPERTENSION: Julia Knight presents for the medical management of hypertension.  Patient's current hypertension medication regimen is: None currently; patient stopped her lisinopril prior to surgery and has not restarted since January.  Patient was recommended to stop lisinopril due to hyperkalemia and start on amlodipine for blood pressure control.  She has not started yet.  Patient is not currently taking prescribed medications for HTN.  Patient is  regularly keeping a check on BP at home.  Adhering to low sodium diet: Yes Exercising Regularly: Not currently due to lumbar fusion recently Denies headache, dizziness, CP, SHOB, vision changes.    BP Readings from Last 3 Encounters:  06/21/23 (!) 154/63  06/12/23 (!) 134/58  06/08/23 (!) 141/64     The following portions of the patient's history were reviewed and updated as appropriate: past medical history, past surgical history, family history, social history, allergies, medications, and problem list.   Patient Active Problem List   Diagnosis Date Noted   Lumbar radiculopathy 05/16/2023   Neuropathy 04/26/2023   CKD stage 3a, GFR 45-59 ml/min (HCC) 04/12/2023   Pulmonary nodule 12/13/2022   Malignant neoplasm of colon, unspecified (HCC) 12/08/2022   Wellness examination 10/13/2022   Balance problem 09/16/2022   Paresthesias 09/16/2022   Weakness of both arms 09/16/2022   Peripheral polyneuropathy 02/01/2022   Nonalcoholic hepatosteatosis 03/29/2021   Pain of upper abdomen 03/19/2021   Hematemesis with nausea 03/19/2021   Pressure injury of sacral region, stage 2 (HCC) 09/18/2020   Spinal stenosis, 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   Spondylolisthesis at L4-L5 level 09/01/2020    Class: Chronic   Status post lumbar spinal fusion 09/01/2020   Hyperlipidemia 07/24/2020   Spondylolisthesis at L3-L4 level 05/22/2020   Anxiety 06/07/2018   Pre-ulcerative corn or  callous 02/10/2017   Leukocytosis 08/09/2013   GERD (gastroesophageal reflux disease) 06/03/2013   Depression 03/08/2012   Primary hypertension 08/24/2011   T2DM (type 2 diabetes mellitus) (HCC) 02/28/2011   Past Medical History:  Diagnosis Date   Acute appendicitis 08/09/2013   Acute appendicitis with rupture 08/09/2013   Acute blood loss anemia 09/10/2020   Allergic rhinitis 08/24/2011   Allergy    Anxiety    Arthritis    back and hands   BMI 34.0-34.9,adult 05/22/2020   Cancer (HCC)    colon per patient   Constipation due to opioid therapy 09/18/2020   COVID-19 11/16/2018   Depression    Diabetes mellitus without complication (HCC)    type 2   Dizziness 08/16/2017   Encounter for medical examination to establish care 07/24/2020   Gastroenteritis, infectious, presumed 08/16/2017   GERD (gastroesophageal reflux disease)    Glossitis 09/10/2020   Hyperlipemia    Hypertension    Left knee pain 02/28/2011   Left leg pain 08/14/2012   Left-sided low back pain with sciatica 10/09/2012   Midline low back pain with right-sided sciatica 02/01/2022   Neuromuscular disorder (HCC)    Postoperative anemia due to acute blood loss 09/03/2020   EBL 600cc received 300 in cell saver blood  Hgb 7.4-7.3 post op day #1        Pyelonephritis 04/25/2013   UTI (urinary tract infection) 06/03/2013   Past Surgical History:  Procedure Laterality Date   APPENDECTOMY     COLON SURGERY  12/2022   Laparoscopic Right Hemicolectomy   TONSILLECTOMY     removed as an adult   UPPER GASTROINTESTINAL ENDOSCOPY  10/13/2022   Family History  Problem Relation Age of Onset   Diabetes Brother    Colon cancer Neg Hx    Esophageal cancer Neg Hx    Rectal cancer Neg Hx    Stomach cancer Neg Hx    Outpatient Medications Prior to Visit  Medication Sig Dispense Refill   atorvastatin (LIPITOR) 40 MG tablet Take 1 tablet (40 mg total) by mouth at bedtime. (Patient taking differently: Take 40 mg by mouth  in the morning.) 90 tablet 3   calcium carbonate (OSCAL) 1500 (600 Ca) MG TABS tablet Take 600 mg of elemental calcium by mouth in the morning.     glipiZIDE (GLUCOTROL) 5 MG tablet Take 1 tablet (5 mg total) by mouth 2 (two) times daily before a meal. (Patient taking differently: Take 5 mg by mouth every evening.) 180 tablet 1   Glucosamine Sulfate 500 MG TABS Take 500 mg by mouth in the morning and at bedtime.     Multiple Vitamin (MULTIVITAMIN WITH MINERALS) TABS tablet Take 1 tablet by mouth in the morning.     ondansetron (ZOFRAN) 4 MG tablet Take 1 tablet (4 mg total) by mouth every 8 (eight) hours as needed for nausea or vomiting. 20 tablet 0   famotidine (PEPCID) 20 MG tablet Take 1 tablet (20 mg total) by mouth 2 (two) times daily. 30 tablet 0   metFORMIN (GLUCOPHAGE) 1000 MG tablet Take 1 tablet (1,000 mg total) by mouth 2 (two) times daily (in the morning and evening with a meal). 180 tablet 3   No facility-administered  medications prior to visit.   No Known Allergies   ROS: A complete ROS was performed with pertinent positives/negatives noted in the HPI. The remainder of the ROS are negative.    Objective:   Today's Vitals   06/21/23 1053  BP: (!) 154/63  Pulse: 94  SpO2: 94%  Weight: 166 lb 6.4 oz (75.5 kg)  Height: 5\' 1"  (1.549 m)    GENERAL: Well-appearing, in NAD. Well nourished.  SKIN: Pink, warm and dry.  Head: Normocephalic. NECK: Trachea midline. Full ROM w/o pain or tenderness.   THROAT: Uvula midline. Oropharynx clear. Mucous membranes pink and moist.  RESPIRATORY: Chest wall symmetrical. Respirations even and non-labored. Breath sounds clear to auscultation bilaterally.  CARDIAC: S1, S2 present, regular rate and rhythm without murmur or gallops. Peripheral pulses 2+ bilaterally.  GI: Abdomen soft, epigastric tenderness upon palpation.  Normoactive bowel sounds. No hepatomegaly or splenomegaly. No CVA tenderness.  MSK: Muscle tone and strength appropriate for  age.  EXTREMITIES: Without clubbing, cyanosis, or edema.  NEUROLOGIC: No motor or sensory deficits. Steady, even gait with assist of cane. C2-C12 intact.  PSYCH/MENTAL STATUS: Alert, oriented x 3. Cooperative, appropriate mood and affect.    No results found for any visits on 06/21/23.    Assessment & Plan:  1. Nausea and vomiting, unspecified vomiting type (Primary) Will rule out possible H. pylori infection with breath test today in the lab.  Will also obtain CBC and CMP due to recent anemia and previous concern of blood in emesis and stool which has since resolved per patient.  Patient to continue Pepcid 20 mg twice a day and follow-up in 2 weeks.  Referral placed to gastroenterology for further evaluation.  Gust bland diet and progressing diet as tolerated.  Will also make changes to patient's diabetic medications if this is impairing and causing nausea and vomiting. - H. pylori breath test - Ambulatory referral to Gastroenterology - famotidine (PEPCID) 20 MG tablet; Take 1 tablet (20 mg total) by mouth 2 (two) times daily.  Dispense: 60 tablet; Refill: 3 - CBC with Differential/Platelet - Comprehensive metabolic panel  2. Type 2 diabetes mellitus with hyperglycemia, without long-term current use of insulin (HCC) Previous A1c showed improved control.  Doing well currently with diet.  Will stop use of metformin immediate release and change patient to metformin XR 500 mg twice daily with meals.  We will see if this helps to improve nausea and vomiting. - metFORMIN (GLUCOPHAGE-XR) 500 MG 24 hr tablet; Take 1 tablet (500 mg total) by mouth 2 (two) times daily with a meal.  Dispense: 60 tablet; Refill: 3  3. Primary hypertension Patient to start amlodipine 5 mg daily and recommend monitoring blood pressure at least 2-3 times a week.  She will return in 2 weeks for blood pressure check. - amLODipine (NORVASC) 5 MG tablet; Take 1 tablet (5 mg total) by mouth daily.  Dispense: 90 tablet; Refill:  3   Meds ordered this encounter  Medications   famotidine (PEPCID) 20 MG tablet    Sig: Take 1 tablet (20 mg total) by mouth 2 (two) times daily.    Dispense:  60 tablet    Refill:  3    Supervising Provider:   DE Peru, RAYMOND J [[id number]]   metFORMIN (GLUCOPHAGE-XR) 500 MG 24 hr tablet    Sig: Take 1 tablet (500 mg total) by mouth 2 (two) times daily with a meal.    Dispense:  60 tablet    Refill:  3  Supervising Provider:   DE Peru, RAYMOND J [[id number]]   amLODipine (NORVASC) 5 MG tablet    Sig: Take 1 tablet (5 mg total) by mouth daily.    Dispense:  90 tablet    Refill:  3    Supervising Provider:   DE [place], RAYMOND J [[id number]]   Lab Orders         H. pylori breath test         CBC with Differential/Platelet         Comprehensive metabolic panel      Return in about 2 weeks (around 07/05/2023) for Follow up BP, Abdominal Pain,Vomiting .    Patient to reach out to office if new, worrisome, or unresolved symptoms arise or if no improvement in patient's condition. Patient verbalized understanding and is agreeable to treatment plan. All questions answered to patient's satisfaction.    Hilbert Bible, [place]

## 2023-06-21 NOTE — Patient Instructions (Addendum)
Start Amlodipine for blood pressure. Please check her blood pressure at least 2-3 x per week. Goal is less than 140/80.   Do not take Lisnopril.    Stop Metformin 1000mg  twice daily. Start Metformin XR 500mg  twice daily instead.   Continue the Pepcid 20mg  twice daily.    Follow a Bland Diet- Bananas, Rice, Applesauce, Toast, no spicy foods, no greasy foods.

## 2023-06-22 ENCOUNTER — Ambulatory Visit: Payer: Self-pay | Admitting: *Deleted

## 2023-06-22 ENCOUNTER — Ambulatory Visit
Admission: RE | Admit: 2023-06-22 | Discharge: 2023-06-22 | Disposition: A | Discharge status: Home / Self Care | Payer: No Typology Code available for payment source | Source: Ambulatory Visit | Attending: Obstetrics and Gynecology | Admitting: Obstetrics and Gynecology

## 2023-06-22 VITALS — BP 143/68 | Wt 163.0 lb

## 2023-06-22 DIAGNOSIS — Z1231 Encounter for screening mammogram for malignant neoplasm of breast: Secondary | ICD-10-CM

## 2023-06-22 DIAGNOSIS — Z01419 Encounter for gynecological examination (general) (routine) without abnormal findings: Secondary | ICD-10-CM

## 2023-06-22 LAB — CBC WITH DIFFERENTIAL/PLATELET
Basophils Absolute: 0.1 10*3/uL (ref 0.0–0.2)
Basos: 1 %
EOS (ABSOLUTE): 0.4 10*3/uL (ref 0.0–0.4)
Eos: 4 %
Hematocrit: 33.6 % — ABNORMAL LOW (ref 34.0–46.6)
Hemoglobin: 10.6 g/dL — ABNORMAL LOW (ref 11.1–15.9)
Immature Grans (Abs): 0 10*3/uL (ref 0.0–0.1)
Immature Granulocytes: 0 %
Lymphocytes Absolute: 2.6 10*3/uL (ref 0.7–3.1)
Lymphs: 23 %
MCH: 26.9 pg (ref 26.6–33.0)
MCHC: 31.5 g/dL (ref 31.5–35.7)
MCV: 85 fL (ref 79–97)
Monocytes Absolute: 0.8 10*3/uL (ref 0.1–0.9)
Monocytes: 7 %
Neutrophils Absolute: 7.5 10*3/uL — ABNORMAL HIGH (ref 1.4–7.0)
Neutrophils: 65 %
Platelets: 314 10*3/uL (ref 150–450)
RBC: 3.94 x10E6/uL (ref 3.77–5.28)
RDW: 12.5 % (ref 11.7–15.4)
WBC: 11.5 10*3/uL — ABNORMAL HIGH (ref 3.4–10.8)

## 2023-06-22 LAB — COMPREHENSIVE METABOLIC PANEL
ALT: 10 [IU]/L (ref 0–32)
AST: 7 [IU]/L (ref 0–40)
Albumin: 4.5 g/dL (ref 3.9–4.9)
Alkaline Phosphatase: 203 [IU]/L — ABNORMAL HIGH (ref 44–121)
BUN/Creatinine Ratio: 12 (ref 12–28)
BUN: 14 mg/dL (ref 8–27)
Bilirubin Total: 0.2 mg/dL (ref 0.0–1.2)
CO2: 19 mmol/L — ABNORMAL LOW (ref 20–29)
Calcium: 9.8 mg/dL (ref 8.7–10.3)
Chloride: 99 mmol/L (ref 96–106)
Creatinine, Ser: 1.16 mg/dL — ABNORMAL HIGH (ref 0.57–1.00)
Globulin, Total: 3.1 g/dL (ref 1.5–4.5)
Glucose: 311 mg/dL — ABNORMAL HIGH (ref 70–99)
Potassium: 4.7 mmol/L (ref 3.5–5.2)
Sodium: 137 mmol/L (ref 134–144)
Total Protein: 7.6 g/dL (ref 6.0–8.5)
eGFR: 51 mL/min/{1.73_m2} — ABNORMAL LOW (ref >59)

## 2023-06-22 LAB — H. PYLORI BREATH TEST: H pylori Breath Test: NEGATIVE

## 2023-06-22 NOTE — Patient Instructions (Signed)
Explained breast self awareness with Julia Knight. Pap smear completed today. Let her know that if her Pap smear today is normal and HPV negative that she doesn't need any further Pap smears due to she is over 73 years of age. Referred patient to the [facility] for a screening mammogram on mobile unit. Appointment scheduled Thursday, June 22, 2023 at 1130. Patient aware of appointment and will be there. Let patient know will follow up with her within the next couple weeks with results of Pap smear by letter or phone. Informed patient that the Breast Center will follow up with her within the next couple of weeks with results of mammogram by letter or phone. Julia Knight verbalized understanding.  Waverley Krempasky, Kathaleen Maser, RN 12:55 PM

## 2023-06-22 NOTE — Progress Notes (Signed)
Ms. Malyah Ohlrich is a 73 y.o. No obstetric history on file. female who presents to [facility] clinic today with complaint of left upper breast pain when touches only x one month since her back surgery. Patient rates the pain at a 3-4 out of 10.    Pap Smear: Pap smear completed today. Per patient has never had a Pap smear completed. No Pap smear results are available in EPIC.   Physical exam: Breasts Breasts symmetrical. No skin abnormalities bilateral breasts. No nipple retraction bilateral breasts. No nipple discharge bilateral breasts. No lymphadenopathy. No lumps palpated bilateral breasts. No complaints of pain or tenderness on exam.     MM 3D SCREEN BREAST BILATERAL Result Date: 12/04/2020 CLINICAL DATA:  Screening. EXAM: DIGITAL SCREENING BILATERAL MAMMOGRAM WITH TOMOSYNTHESIS AND CAD TECHNIQUE: Bilateral screening digital craniocaudal and mediolateral oblique mammograms were obtained. Bilateral screening digital breast tomosynthesis was performed. The images were evaluated with computer-aided detection. COMPARISON:  None. ACR Breast Density Category c: The breast tissue is heterogeneously dense, which may obscure small masses FINDINGS: There are no findings suspicious for malignancy. IMPRESSION: No mammographic evidence of malignancy. A result letter of this screening mammogram will be mailed directly to the patient. RECOMMENDATION: Screening mammogram in one year. (Code:SM-B-01Y) BI-RADS CATEGORY  1: Negative. Electronically Signed   By: Elberta Fortis M.D.   On: 12/04/2020 15:50   Pelvic/Bimanual Ext Genitalia No lesions, no swelling and no discharge observed on external genitalia.        Vagina Vagina pink and normal texture. No lesions or discharge observed in vagina.        Cervix Cervix is present. Cervix pink and of normal texture. No discharge observed.    Uterus Uterus is present and palpable. Uterus in normal position and normal size.        Adnexae Bilateral  ovaries present and palpable. No tenderness on palpation.         Rectovaginal No rectal exam completed today since patient had no rectal complaints. No skin abnormalities observed on exam.     Smoking History: Patient is a former smoker that quick one year ago.   Patient Navigation: Patient education provided. Access to services provided for patient through [facility] program. Spanish interpreter Natale Lay from [facility] provided.   Colorectal Cancer Screening: Per patient has had colonoscopy completed on 11/15/2022.  No complaints today.    Breast and Cervical Cancer Risk Assessment: Patient does not have family history of breast cancer, known genetic mutations, or radiation treatment to the chest before age 85. Patient does not have history of cervical dysplasia, immunocompromised, or DES exposure in-utero.  Risk Scores as of Encounter on 06/22/2023     Dondra Spry           5-year 1.14%   Lifetime 3.77%            Last calculated by Caprice Red, CMA on 06/22/2023 at 11:37 AM        A: [facility] exam with pap smear Complaint of left upper breast pain when touched.   P: Referred patient to the [facility] for a screening mammogram on mobile unit. Appointment scheduled Thursday, June 22, 2023 at 1130.  Priscille Heidelberg, RN 06/22/2023 12:55 PM

## 2023-06-26 ENCOUNTER — Other Ambulatory Visit: Payer: Self-pay

## 2023-06-26 ENCOUNTER — Ambulatory Visit (INDEPENDENT_AMBULATORY_CARE_PROVIDER_SITE_OTHER): Payer: Self-pay | Admitting: Orthopedic Surgery

## 2023-06-26 ENCOUNTER — Other Ambulatory Visit (INDEPENDENT_AMBULATORY_CARE_PROVIDER_SITE_OTHER): Payer: Self-pay

## 2023-06-26 ENCOUNTER — Other Ambulatory Visit (HOSPITAL_BASED_OUTPATIENT_CLINIC_OR_DEPARTMENT_OTHER): Payer: Self-pay | Admitting: *Deleted

## 2023-06-26 DIAGNOSIS — E1165 Type 2 diabetes mellitus with hyperglycemia: Secondary | ICD-10-CM

## 2023-06-26 DIAGNOSIS — N1831 Chronic kidney disease, stage 3a: Secondary | ICD-10-CM

## 2023-06-26 DIAGNOSIS — Z981 Arthrodesis status: Secondary | ICD-10-CM

## 2023-06-26 DIAGNOSIS — R112 Nausea with vomiting, unspecified: Secondary | ICD-10-CM

## 2023-06-26 DIAGNOSIS — I10 Essential (primary) hypertension: Secondary | ICD-10-CM

## 2023-06-26 MED ORDER — TRAMADOL HCL 50 MG PO TABS
50.0000 mg | ORAL_TABLET | Freq: Four times a day (QID) | ORAL | 0 refills | Status: AC | PRN
  Filled 2023-06-26: qty 20, 5d supply, fill #0

## 2023-06-26 NOTE — Progress Notes (Addendum)
 Orthopedic Surgery Post-operative Office Visit   Procedure: L2-L4 XLIF and L2-4 PSIF Date of Surgery: 05/16/2023 (~6 weeks post-op)   Assessment: Patient is a 73 y.o. who has noticed significant improvement in her radiating leg pain and cramping since surgery     Plan: -Operative plans complete -Out of bed as tolerated, no brace -Okay to submerge wounds at this time -No bending/lifting/twisting greater than 10 pounds -Pain management: OTC medications -Return to office in 6 weeks, x-rays needed at next visit: AP/lateral/flex/ex lumbar   ___________________________________________________________________________     Subjective: Patient has been at home and ambulating with a cane.  She says that her back pain is significantly better since she was last seen.  Her leg pain continues to be much better than how it was before surgery.  She states she is having less cramping in her legs as well.  She has been using Tylenol to control her pain.  She has no complaints at this time.  She is overall pleased with her surgical result so far.   Objective:   General: no acute distress, appropriate affect, ambulating with walker Neurologic: alert, answering questions appropriately, following commands Respiratory: unlabored breathing on room air Skin: incisions are well healed with no erythema, induration, active/expressible drainage   MSK (spine):   -Strength exam                                                   Left                  Right   EHL                              4/5                  4/5 TA                                 5/5                  5/5 GSC                             5/5                  5/5 Knee extension            5/5                  5/5 Hip flexion                    4/5                  5/5   -Sensory exam                           Sensation intact to light touch in L2-S1 nerve distributions of bilateral lower extremities   Imaging: X-rays of the lumbar spine  from 06/26/2023 were independently reviewed and interpreted, showing interbody devices at L2/3, L3/4, L4/5, L5/S1.  Interbody's appear in appropriate position.  There is posterior instrumentation from L2-S1.  No lucency seen around  the screws.  There are connector devices between the L3 and L5 screws with no evidence of complication.  No fracture or dislocation seen.     Patient name: Julia Knight Patient MRN: [id number] Date of visit:06/26/23

## 2023-06-28 LAB — SPECIMEN STATUS REPORT

## 2023-06-28 LAB — GAMMA GT: GGT: 24 [IU]/L (ref 0–60)

## 2023-06-28 NOTE — Progress Notes (Signed)
H&H have improved from prior ER visit. GGT is normal. Her WBC is still slightly elevated and may be due to gastritis. Her H Pylori test was negative.   Can we call patient and see how she is doing with nausea/vomiting? Has she received a call to schedule from GI?

## 2023-06-29 ENCOUNTER — Encounter (HOSPITAL_BASED_OUTPATIENT_CLINIC_OR_DEPARTMENT_OTHER): Payer: Self-pay | Admitting: *Deleted

## 2023-06-29 LAB — CYTOLOGY - PAP
Comment: NEGATIVE
High risk HPV: NEGATIVE

## 2023-07-04 ENCOUNTER — Ambulatory Visit (HOSPITAL_BASED_OUTPATIENT_CLINIC_OR_DEPARTMENT_OTHER): Payer: Self-pay | Admitting: Family Medicine

## 2023-07-11 ENCOUNTER — Ambulatory Visit (HOSPITAL_BASED_OUTPATIENT_CLINIC_OR_DEPARTMENT_OTHER): Payer: Self-pay | Admitting: Family Medicine

## 2023-07-13 ENCOUNTER — Ambulatory Visit (HOSPITAL_BASED_OUTPATIENT_CLINIC_OR_DEPARTMENT_OTHER): Payer: Self-pay | Admitting: Family Medicine

## 2023-07-27 ENCOUNTER — Encounter (HOSPITAL_BASED_OUTPATIENT_CLINIC_OR_DEPARTMENT_OTHER): Payer: Self-pay | Admitting: Family Medicine

## 2023-07-27 ENCOUNTER — Other Ambulatory Visit (HOSPITAL_BASED_OUTPATIENT_CLINIC_OR_DEPARTMENT_OTHER): Payer: Self-pay

## 2023-07-27 ENCOUNTER — Other Ambulatory Visit: Payer: Self-pay

## 2023-07-27 ENCOUNTER — Ambulatory Visit (HOSPITAL_BASED_OUTPATIENT_CLINIC_OR_DEPARTMENT_OTHER): Payer: Self-pay | Admitting: Family Medicine

## 2023-07-27 DIAGNOSIS — I10 Essential (primary) hypertension: Secondary | ICD-10-CM

## 2023-07-27 DIAGNOSIS — E782 Mixed hyperlipidemia: Secondary | ICD-10-CM

## 2023-07-27 DIAGNOSIS — E1165 Type 2 diabetes mellitus with hyperglycemia: Secondary | ICD-10-CM

## 2023-07-27 DIAGNOSIS — R112 Nausea with vomiting, unspecified: Secondary | ICD-10-CM

## 2023-07-27 DIAGNOSIS — Z7984 Long term (current) use of oral hypoglycemic drugs: Secondary | ICD-10-CM

## 2023-07-27 MED ORDER — FAMOTIDINE 20 MG PO TABS
20.0000 mg | ORAL_TABLET | Freq: Two times a day (BID) | ORAL | 3 refills | Status: DC
  Filled 2023-07-27 (×2): qty 60, 30d supply, fill #0

## 2023-07-27 MED ORDER — GLIPIZIDE 5 MG PO TABS
5.0000 mg | ORAL_TABLET | Freq: Every day | ORAL | 3 refills | Status: AC
  Filled 2023-07-27 (×2): qty 90, 90d supply, fill #0
  Filled 2023-11-24: qty 90, 90d supply, fill #1
  Filled 2024-02-21 (×2): qty 90, 90d supply, fill #2

## 2023-07-27 MED ORDER — ATORVASTATIN CALCIUM 40 MG PO TABS
40.0000 mg | ORAL_TABLET | Freq: Every day | ORAL | 3 refills | Status: AC
  Filled 2023-07-27 (×2): qty 90, 90d supply, fill #0
  Filled 2023-11-24: qty 90, 90d supply, fill #1
  Filled 2024-02-21 (×2): qty 90, 90d supply, fill #2

## 2023-07-27 MED ORDER — METFORMIN HCL ER 500 MG PO TB24
500.0000 mg | ORAL_TABLET | Freq: Two times a day (BID) | ORAL | 3 refills | Status: AC
  Filled 2023-07-27 (×2): qty 60, 30d supply, fill #0
  Filled 2023-08-29: qty 60, 30d supply, fill #1
  Filled 2023-11-24: qty 60, 30d supply, fill #2
  Filled 2024-02-21: qty 60, 30d supply, fill #3

## 2023-07-27 MED ORDER — AMLODIPINE BESYLATE 5 MG PO TABS
5.0000 mg | ORAL_TABLET | Freq: Every day | ORAL | 3 refills | Status: AC
  Filled 2023-07-27 – 2023-11-24 (×3): qty 90, 90d supply, fill #0
  Filled 2024-02-21 (×2): qty 90, 90d supply, fill #1

## 2023-07-27 NOTE — Progress Notes (Signed)
 Subjective:   Julia Knight Sep 22, 1950 07/27/2023  Chief Complaint  Patient presents with   Medical Management of Chronic Issues    2-week follow up; pt said she still has some pain in her stomach but it is better than before. States the medication that was prescribed to help with her stomach has been working and also has been helping with her appetite and she is eating better. States the vomiting also has stopped.     HPI:  Due to language barrier, a medical interpreter was present during the HPI, ROS, and discussion for the plan of care.  Interpreter: Mariel, Spanish Interpreter    Julia Knight presents today for re-assessment and management of chronic medical conditions.  GASTRITIS:  Julia Knight is a 73 year old female with history of type 2 diabetes, primary hypertension, colon cancer in remission, SP lumbar spinal fusion and CKD stage IIIa presenting for follow-up of hematemesis with nausea ongoing for approximately 1-2 months.  Patient is accompanied by her family members today.  She states on 06/07/2023 she went to the ER due to severe epigastric pain and vomiting.  She recently had a spinal fusion on 05/16/2023.  She had a workup with CT that was unremarkable for acute abnormality.  Normal lipase, slight elevation in LFTs.  She was given Protonix and Zofran and had improvement of symptoms and diagnosed with possible gastritis or PUD.  She was sent home with Protonix daily and reports that her symptoms actually worsened with starting the Protonix.   She returned to the ER on June 12, 2023 for ongoing abdominal pain, dark bloody stools, and nausea and vomiting.  White count was slightly elevated at 11.2, alk phos 164 hemoglobin at 9.9 and hematocrit 31.7.  Her CT abdomen pelvis was repeated with angiogram for possible mesenteric ischemia.  CT was unremarkable.  Patient was given medication for pain relief, fluids, Zofran and GI cocktail and  Pepcid with significant relief of symptoms.  Patient followed up with PCP on 06/21/2023 and had testing for H. pylori with repeat of liver enzymes and CBC.  GGT was unremarkable given alkaline phosphatase elevation.  H. pylori was negative.  Patient was recommended to continue on Pepcid twice daily and follow-up with GI.  Her metformin was switched to extended release.  Upon follow-up today, patient reports resolution of nausea, vomiting, and upset stomach.  She is still taking Pepcid twice daily and reports complete resolution of symptoms.  She did cancel her GI appointment due to this.  She reports she is tolerating metformin extended release well without any GI side effects.  She states her appetite has improved and is now able to eat without difficulty.   HYPERTENSION: Julia Knight presents for the medical management of hypertension.  Patient's current hypertension medication regimen is: Amlodipine 5mg   She has noticed mild swelling to her bilateral feet.  She is taking her amlodipine in the morning.  Patient is  currently taking prescribed medications for HTN.  Patient is not regularly keeping a check on BP at home.  Adhering to low sodium diet: Yes Exercising Regularly: No Denies headache, dizziness, CP, SHOB, vision changes.    BP Readings from Last 3 Encounters:  07/27/23 136/62  06/22/23 (!) 143/68  06/21/23 (!) 154/63     The following portions of the patient's history were reviewed and updated as appropriate: past medical history, past surgical history, family history, social history, allergies, medications, and problem list.   Patient Active Problem List  Diagnosis Date Noted   Lumbar radiculopathy 05/16/2023   Neuropathy 04/26/2023   CKD stage 3a, GFR 45-59 ml/min (HCC) 04/12/2023   Pulmonary nodule 12/13/2022   Malignant neoplasm of colon, unspecified (HCC) 12/08/2022   Wellness examination 10/13/2022   Balance problem 09/16/2022   Paresthesias  09/16/2022   Weakness of both arms 09/16/2022   Peripheral polyneuropathy 02/01/2022   Nonalcoholic hepatosteatosis 03/29/2021   Pain of upper abdomen 03/19/2021   Hematemesis with nausea 03/19/2021   Pressure injury of sacral region, stage 2 (HCC) 09/18/2020   Spinal stenosis, 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   Spondylolisthesis at L4-L5 level 09/01/2020    Class: Chronic   Status post lumbar spinal fusion 09/01/2020   Hyperlipidemia 07/24/2020   Spondylolisthesis at L3-L4 level 05/22/2020   Anxiety 06/07/2018   Pre-ulcerative corn or callous 02/10/2017   Leukocytosis 08/09/2013   GERD (gastroesophageal reflux disease) 06/03/2013   Depression 03/08/2012   Primary hypertension 08/24/2011   T2DM (type 2 diabetes mellitus) (HCC) 02/28/2011   Past Medical History:  Diagnosis Date   Acute appendicitis 08/09/2013   Acute appendicitis with rupture 08/09/2013   Acute blood loss anemia 09/10/2020   Allergic rhinitis 08/24/2011   Allergy    Anxiety    Arthritis    back and hands   BMI 34.0-34.9,adult 05/22/2020   Cancer (HCC)    colon per patient   Constipation due to opioid therapy 09/18/2020   COVID-19 11/16/2018   Depression    Diabetes mellitus without complication (HCC)    type 2   Dizziness 08/16/2017   Encounter for medical examination to establish care 07/24/2020   Gastroenteritis, infectious, presumed 08/16/2017   GERD (gastroesophageal reflux disease)    Glossitis 09/10/2020   Hyperlipemia    Hypertension    Left knee pain 02/28/2011   Left leg pain 08/14/2012   Left-sided low back pain with sciatica 10/09/2012   Midline low back pain with right-sided sciatica 02/01/2022   Neuromuscular disorder (HCC)    Postoperative anemia due to acute blood loss 09/03/2020   EBL 600cc received 300 in cell saver blood  Hgb 7.4-7.3 post op day #1        Pyelonephritis 04/25/2013   UTI (urinary tract infection) 06/03/2013   Past Surgical History:   Procedure Laterality Date   APPENDECTOMY     COLON SURGERY  12/2022   Laparoscopic Right Hemicolectomy   TONSILLECTOMY     removed as an adult   UPPER GASTROINTESTINAL ENDOSCOPY  10/13/2022   Family History  Problem Relation Age of Onset   Diabetes Brother    Colon cancer Neg Hx    Esophageal cancer Neg Hx    Rectal cancer Neg Hx    Stomach cancer Neg Hx    Breast cancer Neg Hx    Outpatient Medications Prior to Visit  Medication Sig Dispense Refill   calcium carbonate (OSCAL) 1500 (600 Ca) MG TABS tablet Take 600 mg of elemental calcium by mouth in the morning.     Glucosamine Sulfate 500 MG TABS Take 500 mg by mouth in the morning and at bedtime.     Multiple Vitamin (MULTIVITAMIN WITH MINERALS) TABS tablet Take 1 tablet by mouth in the morning.     ondansetron (ZOFRAN) 4 MG tablet Take 1 tablet (4 mg total) by mouth every 8 (eight) hours as needed for nausea or vomiting. 20 tablet 0   amLODipine (NORVASC) 5 MG tablet Take 1 tablet (5 mg total) by mouth daily. 90 tablet 3  atorvastatin (LIPITOR) 40 MG tablet Take 1 tablet (40 mg total) by mouth at bedtime. (Patient taking differently: Take 40 mg by mouth in the morning.) 90 tablet 3   famotidine (PEPCID) 20 MG tablet Take 1 tablet (20 mg total) by mouth 2 (two) times daily. 60 tablet 3   glipiZIDE (GLUCOTROL) 5 MG tablet Take 1 tablet (5 mg total) by mouth 2 (two) times daily before a meal. (Patient taking differently: Take 5 mg by mouth every evening.) 180 tablet 1   metFORMIN (GLUCOPHAGE-XR) 500 MG 24 hr tablet Take 1 tablet (500 mg total) by mouth 2 (two) times daily with a meal. 60 tablet 3   No facility-administered medications prior to visit.   No Known Allergies   ROS: A complete ROS was performed with pertinent positives/negatives noted in the HPI. The remainder of the ROS are negative.    Objective:   Today's Vitals   07/27/23 1404 07/27/23 1415  BP: (!) 146/60 136/62  Pulse: 75   SpO2: 98%   Weight: 166 lb  3.2 oz (75.4 kg)   Height: 5\' 1"  (1.549 m)     Physical Exam          GENERAL: Well-appearing, in NAD. Well nourished.  SKIN: Pink, warm and dry.  Head: Normocephalic. NECK: Trachea midline. Full ROM w/o pain or tenderness.  RESPIRATORY: Chest wall symmetrical. Respirations even and non-labored. Breath sounds clear to auscultation bilaterally.  CARDIAC: S1, S2 present, regular rate and rhythm without murmur or gallops. Peripheral pulses 2+ bilaterally.  GI: Abdomen soft, non-tender. Normoactive bowel sounds. No rebound tenderness. No hepatomegaly or splenomegaly. No CVA tenderness.  MSK: Muscle tone and strength appropriate for age.  EXTREMITIES: Without clubbing, cyanosis, or edema.  NEUROLOGIC: No motor or sensory deficits. Steady, even gait with cane. C2-C12 intact.  PSYCH/MENTAL STATUS: Alert, oriented x 3. Cooperative, appropriate mood and affect.      Assessment & Plan:  1. Nausea and vomiting, unspecified vomiting type Resolved.  Possible gastritis.  Patient will continue to take Pepcid 20 mg twice daily for the next 4 weeks and then decrease to once daily or as needed.  Discussed good dietary changes and will follow-up if symptoms return.  Will repeat patient's lab work in approximately 1 to 2 months. - famotidine (PEPCID) 20 MG tablet; Take 1 tablet (20 mg total) by mouth 2 (two) times daily.  Dispense: 60 tablet; Refill: 3  2. Primary hypertension Well-controlled.  Continue amlodipine 5 mg and recommend patient switch administration time to bedtime due to bilateral leg edema.  If no improvement in edema, reach out to PCP.  Recommend patient continue to monitor blood pressure regularly at home. - amLODipine (NORVASC) 5 MG tablet; Take 1 tablet (5 mg total) by mouth daily.  Dispense: 90 tablet; Refill: 3  3. Type 2 diabetes mellitus with hyperglycemia, without long-term current use of insulin (HCC) Will assess control with upcoming appointment in 2 months.  Refills provided  patient request - metFORMIN (GLUCOPHAGE-XR) 500 MG 24 hr tablet; Take 1 tablet (500 mg total) by mouth 2 (two) times daily with a meal.  Dispense: 60 tablet; Refill: 3 - glipiZIDE (GLUCOTROL) 5 MG tablet; Take 1 tablet (5 mg total) by mouth daily before breakfast.  Dispense: 90 tablet; Refill: 3  4. Mixed hyperlipidemia Refills provided per patient request. - atorvastatin (LIPITOR) 40 MG tablet; Take 1 tablet (40 mg total) by mouth at bedtime.  Dispense: 90 tablet; Refill: 3   Meds ordered this encounter  Medications  famotidine (PEPCID) 20 MG tablet    Sig: Take 1 tablet (20 mg total) by mouth 2 (two) times daily.    Dispense:  60 tablet    Refill:  3   metFORMIN (GLUCOPHAGE-XR) 500 MG 24 hr tablet    Sig: Take 1 tablet (500 mg total) by mouth 2 (two) times daily with a meal.    Dispense:  60 tablet    Refill:  3   amLODipine (NORVASC) 5 MG tablet    Sig: Take 1 tablet (5 mg total) by mouth daily.    Dispense:  90 tablet    Refill:  3   atorvastatin (LIPITOR) 40 MG tablet    Sig: Take 1 tablet (40 mg total) by mouth at bedtime.    Dispense:  90 tablet    Refill:  3   glipiZIDE (GLUCOTROL) 5 MG tablet    Sig: Take 1 tablet (5 mg total) by mouth daily before breakfast.    Dispense:  90 tablet    Refill:  3    Supervising Provider:   DE Peru, RAYMOND J [[id number]]   Lab Orders  No laboratory test(s) ordered today   No images are attached to the encounter or orders placed in the encounter.  Return in about 2 months (around 09/26/2023) for DIABETES CHECK UP (fasting labs same day- please have morning appt) .    Patient to reach out to office if new, worrisome, or unresolved symptoms arise or if no improvement in patient's condition. Patient verbalized understanding and is agreeable to treatment plan. All questions answered to patient's satisfaction.    Hilbert Bible, [place]

## 2023-07-27 NOTE — Patient Instructions (Addendum)
 Please continue Pepcid twice daily for 1 month. You can then decrease Pepcid to once daily or as needed.    Take your Amlodipine at bedtime instead of the morning.    Please get your Shingrix vaccines.

## 2023-08-07 ENCOUNTER — Ambulatory Visit (INDEPENDENT_AMBULATORY_CARE_PROVIDER_SITE_OTHER): Payer: Self-pay | Admitting: Orthopedic Surgery

## 2023-08-07 ENCOUNTER — Other Ambulatory Visit: Payer: Self-pay

## 2023-08-07 DIAGNOSIS — Z9889 Other specified postprocedural states: Secondary | ICD-10-CM

## 2023-08-07 DIAGNOSIS — Z981 Arthrodesis status: Secondary | ICD-10-CM

## 2023-08-07 MED ORDER — TRAZODONE HCL 100 MG PO TABS
100.0000 mg | ORAL_TABLET | Freq: Every day | ORAL | 1 refills | Status: DC
  Filled 2023-08-07: qty 30, 30d supply, fill #0
  Filled 2024-02-21: qty 30, 30d supply, fill #1

## 2023-08-07 NOTE — Progress Notes (Signed)
 Orthopedic Surgery Post-operative Office Visit   Procedure: L2-L4 XLIF and L2-4 PSIF Date of Surgery: 05/16/2023 (~3 months post-op)   Assessment: Patient is a 73 y.o. who has noticed significant improvement in her radiating leg pain and cramping since surgery. Feels she has been able to do more since surgery     Plan: -Operative plans complete -No spine specific precautions -Tylenol has been helping with her pain, so told her to keep using that -Return to office in 3 months, x-rays needed at next visit: AP/lateral/flex/ex lumbar   ___________________________________________________________________________     Subjective: Patient has been doing well since she was last seen in the office.  She said she is not having any more leg cramps.  She has been ambulating with a cane.  She is just using Tylenol for pain control.  She said it is helpful.  She still has some pain in her lower extremities.  She particularly notes in the right knee, but is currently tolerable.   Objective:   General: no acute distress, appropriate affect, ambulating with walker Neurologic: alert, answering questions appropriately, following commands Respiratory: unlabored breathing on room air Skin: incisions are well healed   MSK (spine):   -Strength exam                                                   Left                  Right   EHL                              4/5                  4/5 TA                                 5/5                  5/5 GSC                             5/5                  5/5 Knee extension            5/5                  5/5 Hip flexion                    5/5                  5/5   -Sensory exam                           Sensation intact to light touch in L2-S1 nerve distributions of bilateral lower extremities   Imaging: X-rays of the lumbar spine from 08/07/2023 were independently reviewed and interpreted, showing interbody devices from L2/3 to L5/S1. Interbody's are  well-contained within the former disc spaces.  Posterior instrumentation is seen from L2-S1.  No lucency is seen around any of the screws.  No evidence of instability or translation seen on  the flexion/extension views.  No fracture or dislocation seen.     Patient name: Julia Knight Patient MRN: [id number] Date of visit: 08/07/23

## 2023-08-09 NOTE — Progress Notes (Unsigned)
 08/10/2023 Julia Knight [id number] 07-30-1950  Referring provider: Hilbert Bible, * Primary GI doctor: Dr. Leonides Schanz  ASSESSMENT AND PLAN:   GERD with history of epigastric pain CT abdomen/pelvis 3/32/2024 unremarkable 10/27/2022 HIDA normal 10/13/2022 EGD esophagitis without bleeding, gastritis, normal duodenum negative for infection, autoimmune disease, esophageal changes in setting of reflux ER visit 06/07/2023 and 06/12/2023 for dyspepsia, nausea, vomiting CT angio abdomen pelvis with contrast showed no CT evidence of gastrointestinal hemorrhage aortic atherosclerosis no acute intra-abdominal or intrapelvic abnormality status post partial colectomy stomach within normal limits bowels unremarkable normal pancreas, spleen, liver, gallbladder 06/21/2023 H. pylori breath test negative -Symptoms have resolved if she has pepcid 20 mg BID - refilled pepcid BID for 1 year - Given information about GERD in spanish - possible gastroparesis, given information, consider GES  IDA 06/21/2023  HGB 10.6 MCV 85 Platelets 314 09/20/2022 Iron 29  Patient is not on an iron supplement Will recheck iron/ferritin, if still low consider repeating EGDcolon sooner, patient is due for colon 01/2024 Follow up 3-4 months Recent Labs    09/20/22 1042 11/03/22 0833 05/12/23 0957 05/17/23 0634 05/18/23 2302 05/20/23 0638 06/07/23 1713 06/12/23 1623 06/21/23 1150  HGB 10.8* 11.3 11.0* 8.7* 8.1* 7.6* 9.7* 9.9* 10.6*   History of colon cancer 11/15/2022 colonoscopy bowel prep good normal TI 12 mm polyp ascending colon nonbleeding internal hemorrhoids PATH INVASIVE MODERATELY DIFFERENTIATED COLONIC ADENOCARCINA 01/02/2023 status post right laparoscopic colon resection with Dr. Conard Novak Recall colonoscopy 01/2024  Fatty liver seen on CT 12/07/2022 06/2023 alk phos 203, GGT negative Normal platelets no evidence of portal hypertension Negative hepatitis C, B 2022 did not have hepatitis B  immunity at that time    Latest Ref Rng & Units 06/21/2023   11:50 AM 06/12/2023    4:23 PM 06/07/2023    5:13 PM  Hepatic Function  Total Protein 6.0 - 8.5 g/dL 7.6  7.8  8.5   Albumin 3.9 - 4.9 g/dL 4.5  4.0  4.2   AST 0 - 40 IU/L 7  8  9    ALT 0 - 32 IU/L 10  7  8    Alk Phosphatase 44 - 121 IU/L 203  164  178   Total Bilirubin 0.0 - 1.2 mg/dL 0.2  0.3  0.4   - suggest hep B vaccination -monitor CBC/LFTs every 6 months -Weight loss discussed  Patient Care Team: Hilbert Bible, FNP as PCP - General (Family Medicine)  HISTORY OF PRESENT ILLNESS: 73 y.o. female with a past medical history of anxiety, depression, arthritis, hypertension, hyperlipidemia and GERD and others listed below presents for evaluation of nausea and vomiting.   Discussed the use of AI scribe software for clinical note transcription with the patient, who gave verbal consent to proceed.  History of Present Illness   Julia Knight is a 73 year old female who presents for follow-up of gastrointestinal symptoms. She is accompanied by her daughter.  She has a long-standing history of upper abdominal pain. Multiple CT scans and a HIDA scan have been normal. An endoscopy in June 2024 showed some inflammation but was negative for H. pylori and autoimmune diseases. In January and February 2025, she experienced similar symptoms and visited the emergency room.  Currently, she feels well with no nausea or diarrhea and is able to eat and drink more comfortably. Her improvement is dependent on taking famotidine 20 mg twice daily. Without the medication, she experiences nausea, abdominal pain, and difficulty eating. She denies feeling  full quickly or having a decreased appetite when on medication, but without it, she cannot eat well. She has experienced shortness of breath in the past, which has improved with the medication.  No current shortness of breath, chest pain, or difficulty swallowing. She does not feel  full quickly or have a decreased appetite when on medication.        She  reports that she has quit smoking. Her smoking use included cigarettes. She has been exposed to tobacco smoke. She has never used smokeless tobacco. She reports that she does not drink alcohol and does not use drugs.  RELEVANT GI HISTORY, IMAGING AND LABS: Results   RADIOLOGY HIDA scan: Normal  DIAGNOSTIC Endoscopy: Inflammation, negative for H. pylori and autoimmune disease (10/2022)      CBC    Component Value Date/Time   WBC 11.5 (H) 06/21/2023 1150   WBC 11.2 (H) 06/12/2023 1623   RBC 3.94 06/21/2023 1150   RBC 3.66 (L) 06/12/2023 1623   HGB 10.6 (L) 06/21/2023 1150   HCT 33.6 (L) 06/21/2023 1150   PLT 314 06/21/2023 1150   MCV 85 06/21/2023 1150   MCH 26.9 06/21/2023 1150   MCH 27.0 06/12/2023 1623   MCHC 31.5 06/21/2023 1150   MCHC 31.2 06/12/2023 1623   RDW 12.5 06/21/2023 1150   LYMPHSABS 2.6 06/21/2023 1150   MONOABS 1.0 08/05/2022 1921   EOSABS 0.4 06/21/2023 1150   BASOSABS 0.1 06/21/2023 1150   Recent Labs    09/20/22 1042 11/03/22 0833 05/12/23 0957 05/17/23 0634 05/18/23 2302 05/20/23 0638 06/07/23 1713 06/12/23 1623 06/21/23 1150  HGB 10.8* 11.3 11.0* 8.7* 8.1* 7.6* 9.7* 9.9* 10.6*    CMP     Component Value Date/Time   NA 137 06/21/2023 1150   K 4.7 06/21/2023 1150   CL 99 06/21/2023 1150   CO2 19 (L) 06/21/2023 1150   GLUCOSE 311 (H) 06/21/2023 1150   GLUCOSE 221 (H) 06/12/2023 1623   BUN 14 06/21/2023 1150   CREATININE 1.16 (H) 06/21/2023 1150   CALCIUM 9.8 06/21/2023 1150   PROT 7.6 06/21/2023 1150   ALBUMIN 4.5 06/21/2023 1150   AST 7 06/21/2023 1150   ALT 10 06/21/2023 1150   ALKPHOS 203 (H) 06/21/2023 1150   BILITOT 0.2 06/21/2023 1150   GFRNONAA >60 06/12/2023 1623      Latest Ref Rng & Units 06/21/2023   11:50 AM 06/12/2023    4:23 PM 06/07/2023    5:13 PM  Hepatic Function  Total Protein 6.0 - 8.5 g/dL 7.6  7.8  8.5   Albumin 3.9 - 4.9 g/dL 4.5   4.0  4.2   AST 0 - 40 IU/L 7  8  9    ALT 0 - 32 IU/L 10  7  8    Alk Phosphatase 44 - 121 IU/L 203  164  178   Total Bilirubin 0.0 - 1.2 mg/dL 0.2  0.3  0.4       Current Medications:   Current Outpatient Medications (Endocrine & Metabolic):    glipiZIDE (GLUCOTROL) 5 MG tablet, Take 1 tablet (5 mg total) by mouth daily before breakfast.   metFORMIN (GLUCOPHAGE-XR) 500 MG 24 hr tablet, Take 1 tablet (500 mg total) by mouth 2 (two) times daily with a meal.  Current Outpatient Medications (Cardiovascular):    amLODipine (NORVASC) 5 MG tablet, Take 1 tablet (5 mg total) by mouth daily.   atorvastatin (LIPITOR) 40 MG tablet, Take 1 tablet (40 mg total) by mouth at bedtime.  Current Outpatient Medications (Other):    calcium carbonate (OSCAL) 1500 (600 Ca) MG TABS tablet, Take 600 mg of elemental calcium by mouth in the morning.   docusate sodium (COLACE) 100 MG capsule, Take 100 mg by mouth daily as needed for mild constipation.   Glucosamine Sulfate 500 MG TABS, Take 500 mg by mouth in the morning and at bedtime.   Multiple Vitamin (MULTIVITAMIN WITH MINERALS) TABS tablet, Take 1 tablet by mouth in the morning.   ondansetron (ZOFRAN) 4 MG tablet, Take 1 tablet (4 mg total) by mouth every 8 (eight) hours as needed for nausea or vomiting.   traZODone (DESYREL) 100 MG tablet, Take 1 tablet (100 mg total) by mouth at bedtime.   famotidine (PEPCID) 20 MG tablet, Take 1 tablet (20 mg total) by mouth 2 (two) times daily.  Medical History:  Past Medical History:  Diagnosis Date   Acute appendicitis 08/09/2013   Acute appendicitis with rupture 08/09/2013   Acute blood loss anemia 09/10/2020   Allergic rhinitis 08/24/2011   Allergy    Anxiety    Arthritis    back and hands   BMI 34.0-34.9,adult 05/22/2020   Cancer (HCC) 2024   colon per patient   Constipation due to opioid therapy 09/18/2020   COVID-19 11/16/2018   Depression    Diabetes mellitus without complication (HCC)     type 2   Dizziness 08/16/2017   Encounter for medical examination to establish care 07/24/2020   Gastroenteritis, infectious, presumed 08/16/2017   GERD (gastroesophageal reflux disease)    Glossitis 09/10/2020   Hyperlipemia    Hypertension    Left knee pain 02/28/2011   Left leg pain 08/14/2012   Left-sided low back pain with sciatica 10/09/2012   Midline low back pain with right-sided sciatica 02/01/2022   Neuromuscular disorder (HCC)    Postoperative anemia due to acute blood loss 09/03/2020   EBL 600cc received 300 in cell saver blood  Hgb 7.4-7.3 post op day #1        Pyelonephritis 04/25/2013   UTI (urinary tract infection) 06/03/2013   Allergies: No Known Allergies   Surgical History:  She  has a past surgical history that includes Appendectomy; Tonsillectomy; Upper gastrointestinal endoscopy (10/13/2022); and Colon surgery (12/2022). Family History:  Her family history includes Diabetes in her brother.  REVIEW OF SYSTEMS  : All other systems reviewed and negative except where noted in the History of Present Illness.  PHYSICAL EXAM: BP 122/62   Pulse 90   Ht 5\' 1"  (1.549 m)   Wt 167 lb 6.4 oz (75.9 kg)   SpO2 97%   BMI 31.63 kg/m  Physical Exam   GENERAL APPEARANCE: Well nourished, in no apparent distress. HEENT: No cervical lymphadenopathy, unremarkable thyroid, sclerae anicteric, conjunctiva pink. RESPIRATORY: Respiratory effort normal, breath sounds equal bilaterally without rales, rhonchi, or wheezing. CARDIO: Regular rate and rhythm with no murmurs, rubs, or gallops, peripheral pulses intact. ABDOMEN: Soft, non-distended, active bowel sounds in all four quadrants, mild tenderness in upper abdomen, no rebound, no mass appreciated. RECTAL: Declines. MUSCULOSKELETAL: Full range of motion, normal gait, without edema. SKIN: Dry, intact without rashes or lesions. No jaundice. NEURO: Alert, oriented, no focal deficits. PSYCH: Cooperative, normal mood and affect.       Doree Albee, PA-C 11:47 AM

## 2023-08-10 ENCOUNTER — Other Ambulatory Visit (INDEPENDENT_AMBULATORY_CARE_PROVIDER_SITE_OTHER): Payer: Self-pay

## 2023-08-10 ENCOUNTER — Ambulatory Visit: Payer: Self-pay | Admitting: Physician Assistant

## 2023-08-10 ENCOUNTER — Other Ambulatory Visit: Payer: Self-pay

## 2023-08-10 ENCOUNTER — Encounter: Payer: Self-pay | Admitting: Physician Assistant

## 2023-08-10 VITALS — BP 122/62 | HR 90 | Ht 61.0 in | Wt 167.4 lb

## 2023-08-10 DIAGNOSIS — D649 Anemia, unspecified: Secondary | ICD-10-CM

## 2023-08-10 DIAGNOSIS — R112 Nausea with vomiting, unspecified: Secondary | ICD-10-CM

## 2023-08-10 DIAGNOSIS — D509 Iron deficiency anemia, unspecified: Secondary | ICD-10-CM

## 2023-08-10 DIAGNOSIS — K760 Fatty (change of) liver, not elsewhere classified: Secondary | ICD-10-CM

## 2023-08-10 DIAGNOSIS — Z85038 Personal history of other malignant neoplasm of large intestine: Secondary | ICD-10-CM

## 2023-08-10 DIAGNOSIS — R748 Abnormal levels of other serum enzymes: Secondary | ICD-10-CM

## 2023-08-10 DIAGNOSIS — K219 Gastro-esophageal reflux disease without esophagitis: Secondary | ICD-10-CM

## 2023-08-10 DIAGNOSIS — C189 Malignant neoplasm of colon, unspecified: Secondary | ICD-10-CM

## 2023-08-10 DIAGNOSIS — R1013 Epigastric pain: Secondary | ICD-10-CM

## 2023-08-10 LAB — CBC WITH DIFFERENTIAL/PLATELET
Basophils Absolute: 0.1 10*3/uL (ref 0.0–0.1)
Basophils Relative: 1.2 % (ref 0.0–3.0)
Eosinophils Absolute: 0.3 10*3/uL (ref 0.0–0.7)
Eosinophils Relative: 3.3 % (ref 0.0–5.0)
HCT: 35 % — ABNORMAL LOW (ref 36.0–46.0)
Hemoglobin: 11.4 g/dL — ABNORMAL LOW (ref 12.0–15.0)
Lymphocytes Relative: 29.2 % (ref 12.0–46.0)
Lymphs Abs: 3 10*3/uL (ref 0.7–4.0)
MCHC: 32.6 g/dL (ref 30.0–36.0)
MCV: 82.2 fl (ref 78.0–100.0)
Monocytes Absolute: 0.8 10*3/uL (ref 0.1–1.0)
Monocytes Relative: 7.8 % (ref 3.0–12.0)
Neutro Abs: 6.1 10*3/uL (ref 1.4–7.7)
Neutrophils Relative %: 58.5 % (ref 43.0–77.0)
Platelets: 316 10*3/uL (ref 150.0–400.0)
RBC: 4.26 Mil/uL (ref 3.87–5.11)
RDW: 15 % (ref 11.5–15.5)
WBC: 10.4 10*3/uL (ref 4.0–10.5)

## 2023-08-10 LAB — COMPREHENSIVE METABOLIC PANEL WITH GFR
ALT: 15 U/L (ref 0–35)
AST: 10 U/L (ref 0–37)
Albumin: 4.5 g/dL (ref 3.5–5.2)
Alkaline Phosphatase: 168 U/L — ABNORMAL HIGH (ref 39–117)
BUN: 16 mg/dL (ref 6–23)
CO2: 24 meq/L (ref 19–32)
Calcium: 9.9 mg/dL (ref 8.4–10.5)
Chloride: 106 meq/L (ref 96–112)
Creatinine, Ser: 0.99 mg/dL (ref 0.40–1.20)
GFR: 57.66 mL/min — ABNORMAL LOW (ref >60.00)
Glucose, Bld: 194 mg/dL — ABNORMAL HIGH (ref 70–99)
Potassium: 4.4 meq/L (ref 3.5–5.1)
Sodium: 138 meq/L (ref 135–145)
Total Bilirubin: 0.4 mg/dL (ref 0.2–1.2)
Total Protein: 8.1 g/dL (ref 6.0–8.3)

## 2023-08-10 LAB — IBC + FERRITIN
Ferritin: 6.7 ng/mL — ABNORMAL LOW (ref 10.0–291.0)
Iron: 34 ug/dL — ABNORMAL LOW (ref 42–145)
Saturation Ratios: 8.6 % — ABNORMAL LOW (ref 20.0–50.0)
TIBC: 396.2 ug/dL (ref 250.0–450.0)
Transferrin: 283 mg/dL (ref 212.0–360.0)

## 2023-08-10 MED ORDER — FAMOTIDINE 20 MG PO TABS
20.0000 mg | ORAL_TABLET | Freq: Two times a day (BID) | ORAL | 3 refills | Status: AC
  Filled 2023-08-10 – 2023-08-30 (×2): qty 180, 90d supply, fill #0
  Filled 2023-11-24: qty 180, 90d supply, fill #1
  Filled 2024-02-21: qty 180, 90d supply, fill #2
  Filled 2024-04-02: qty 180, 90d supply, fill #3

## 2023-08-10 NOTE — Patient Instructions (Addendum)
 Your provider has requested that you go to the basement level for lab work before leaving today. Press "B" on the elevator. The lab is located at the first door on the left as you exit the elevator.   ERGE en adultos: cambios en la dieta GERD in Adults: Diet Changes Cuando una persona tiene enfermedad de reflujo gastroesofgico (ERGE), es posible que deba hacer cambios en su dieta. Elegir los alimentos adecuados puede ayudar a [employer]. Considere recurrir a un experto en alimentacin saludable llamado nutricionista. Este puede ayudarlo a hacer elecciones alimentarias saludables. Consejos para seguir Surveyor, minerals Al leer las etiquetas de los alimentos Elija alimentos que tengan bajo contenido de grasas saturadas. Los alimentos que pueden ayudar con los sntomas incluyen los siguientes: Alimentos con menos del 5 % de los valores diarios (VD) de grasa. Alimentos con 0 gramos de grasas trans. Al cocinar Cocine los alimentos de formas que no requieran [place]. Estas formas incluyen las siguientes: Hornear. Cocer al vapor. Grillar. Hervir. Para agregar sabor, trate de consumir hierbas con bajo contenido de picante y [place]. Evite frer los alimentos. Planificacin de las comidas  Haga comidas pequeas durante Glass blower/designer de hacer 3 comidas abundantes. Coma lentamente y en un lugar donde se sienta relajado. Si se lo indic el mdico, evite: Consumir alimentos que le ocasionen sntomas. Lleve un registro de los alimentos para identificar aquellos que le causen sntomas. Alcohol. Beber mucha cantidad de lquido con las comidas. Instrucciones generales Durante 2 a 3 horas despus de comer, evite: Agacharse. Realizar actividad fsica. Acostarse. Masticar chicle sin azcar despus de las comidas. Qu alimentos debo comer? Siga una dieta saludable. Trate de incluir: Alimentos con gran cantidad de [place]. Esto incluye lo siguiente: [place] y verduras. Cereales integrales y  legumbres. Productos lcteos con bajo contenido de [place]. Vita Barley, pescado y aves. Claras de huevo. Los alimentos que causan sntomas en otra persona pueden no causarle sntomas a usted. Trabaje con el mdico para hallar alimentos que sean seguros para usted. Es posible que los productos que se enumeran ms arriba no sean todos los alimentos y las bebidas que puede consumir. Consulte a su nutricionista para obtener ms informacin. Es posible que los productos detallados arriba no constituyan una lista completa de los alimentos y las bebidas que puede tomar. Consulte a un nutricionista para obtener ms informacin. Qu alimentos debo evitar? Limitar algunos de estos alimentos puede ayudar a [employer]. Cada persona es diferente. Hable con un nutricionista o con su mdico para que lo ayude a Photographer. Algunos de los [employer] evitar pueden incluir: Frutas Frutas que sean muy cidas. Estas pueden ser las frutas ctricas como la naranja, el pomelo, la pia y el limn. Verduras Verduras fritas, como las papas fritas. Verduras, salsas o aderezos elaborados con grasa agregada y verduras cidas. Estos pueden [employer] y los productos con tomate, el aj, la Christie, el ajo y el rbano picante. Cereales Pasteles o panes sin levadura con grasa agregada. Carnes y [address] protenas [address] de alto contenido graso como carne grasa de vaca o cerdo, salchichas, costillas, jamn, salchicha, salame y tocino. Carnes o protenas fritas, como el pescado frito y el pollo frito. Yemas de huevo. Grasas y [employer]. Margarina. Lardo. [place] clarificada. Bebidas Caf y Burna Cash bebidas con cafena. Bebidas gaseosas y [address], como los refrescos y las bebidas energizantes. Jugo de fruta hecho con frutas cidas, como naranja o pomelo. Jugo de tomate.  Dulces y postres Chocolate y cacao. Rosquillas. Alios y condimentos Menta, como la menta  piperita y la hierbabuena. Condimentos, hierbas o aderezos que ocasionen sntomas. Estos pueden incluir el curry, la salsa picante o los aderezos para ensalada a base de vinagre. Es posible que los productos que se enumeran ms arriba no sean todos los alimentos y las bebidas que Personnel officer. Consulte a su nutricionista para obtener ms informacin. Preguntas para hacerle al [employer] Los cambios en la dieta y en la vida cotidiana a menudo son los primeros pasos que se toman para Company secretary los sntomas de [place]. Si estos cambios no dan resultados, consulte al mdico si debe tomar [employer]. Dnde obtener ms informacin Arts development officer for Gastrointestinal Disorders (Fundacin Internacional para los Trastornos Gastrointestinales): [email or web address].org Esta informacin no tiene Building services engineer consejo del mdico. Asegrese de hacerle al mdico cualquier pregunta que tenga Gastroparesia Gastroparesis  La gastroparesia es una afeccin en la que los alimentos permanecen en el estmago durante ms tiempo del normal. Esta afeccin tambin se conoce como vaciamiento gstrico tardo. Por lo general, es una afeccin a largo plazo (crnica). No hay una cura, pero hay tratamientos y cosas que puede hacer en su casa para ayudar a [employer]. Tratar la enfermedad subyacente que causa la gastroparesia tambin puede ayudar a [employer] sntomas. Cules son las causas? En muchos casos, se desconoce la causa de esta afeccin. Las causas posibles son las siguientes: Un trastorno hormonal (endocrino), como hipotiroidismo o diabetes. [place] enfermedad del sistema nervioso, como enfermedad de Parkinson o esclerosis mltiple. Cncer, infeccin o ciruga que afecta el estmago o el nervio vago. El nervio vago se extiende desde el pecho, atraviesa el cuello y llega hasta la parte inferior del cerebro. Un trastorno del tejido conjuntivo, como esclerodermia. Ciertos medicamentos. Qu incrementa el  riesgo? Es ms probable que sufra esta afeccin si: Tiene determinados trastornos o enfermedades. Pueden incluir: Un trastorno endocrino. Un trastorno de Psychologist, sport and exercise. Amiloidosis. Esclerodermia. Enfermedad de Parkinson. Esclerosis mltiple. Cncer o infeccin del estmago o el nervio vago. Le han realizado una ciruga en el estmago o el nervio vago. Toma ciertos medicamentos. Es mujer. Cules son los signos o sntomas? Los sntomas de esta afeccin incluyen: Sentirse lleno despus de comer muy poco o prdida de apetito. Nuseas, vmitos o acidez estomacal. Distensin del abdomen. Niveles inconstantes de azcar en la sangre (glucosa) en los anlisis de [place]. Prdida de peso sin causa aparente. El retroceso de cido estomacal hacia el esfago (reflujo gastroesofgico). Rigidez repentina (espasmo) del estmago, que puede ser [employer]. Los sntomas pueden aparecer y Geneticist, molecular. Es posible que [employer] no noten sntomas. Cmo se diagnostica? Esta afeccin se diagnostica mediante estudios, por ejemplo, los siguientes: Exmenes para determinar lo que demoran los alimentos en pasar por el estmago y los intestinos. Estos estudios incluyen: Trnsito gastrointestinal (GI). Para este estudio, usted bebe un lquido que aparece claramente en las radiografas, y Engineer, mining, Chief Executive Officer toman radiografas de los intestinos. Gammagrafa de vaciamiento gstrico. Para este estudio, consumir un alimento con una pequea cantidad de material radiactivo, y luego se tomarn imgenes. Sistema de monitoreo gastrointestinal inalmbrico mediante una cpsula. Para este estudio, tragar un comprimido (cpsula) que registra informacin sobre cmo se mueven los alimentos y lquidos a travs de su Teaching laboratory technician. Manometra gstrica. Para este estudio, se insertar un tubo por la garganta hasta el estmago para medir la actividad elctrica y muscular. Endoscopa. Para este estudio, un tubo [place] y delgado que tiene [place] y Nettie Elm  en el extremo se insertar por la garganta hasta el estmago para detectar problemas en la superficie del estmago. Una ecografa. Este estudio [place] ondas sonoras para crear imgenes del interior del cuerpo. Este puede ayudar a Gaffer enfermedad en la vescula biliar o pancreatitis como la causa de los sntomas. Cmo se trata? No hay cura para esta afeccin, pero el tratamiento y el cuidado en casa pueden aliviar los sntomas. El tratamiento puede incluir: Warehouse manager la causa subyacente. Controlar los sntomas haciendo cambios en sus hbitos alimenticios y de [place] fsica. Tomar medicamentos para controlar las nuseas y los vmitos, y para Photographer. Recibir alimentos a travs de una sonda de alimentacin en el hospital. Esto puede realizarse en casos graves. Someterse a Chief Technology Officer para que le inserten un dispositivo llamado estimulador elctrico gstrico en el cuerpo. Este dispositivo ayuda a Designer, jewellery y a Chief Operating Officer las nuseas y los vmitos. Siga estas instrucciones en su casa: Use los medicamentos de venta libre y los recetados solamente como se lo haya indicado el mdico. Siga las instrucciones del mdico respecto de las restricciones en las comidas o las bebidas. El mdico puede recomendarle que: Consuma pequeas cantidades de alimentos con mayor frecuencia. Coma alimentos con bajo contenido de grasa. Coma alimentos ricos en fibra de forma que sean fciles de digerir. Por ejemplo, coma verduras cocidas en lugar de verduras crudas. Coma solo alimentos lquidos en lugar de alimentos slidos. Los alimentos lquidos son ms fciles de Location manager. Beber suficiente lquido como para Pharmacologist la orina de color amarillo plido. Haga ejercicio con la frecuencia que se lo haya indicado el mdico. Cumpla con todas las visitas de seguimiento. Esto es importante. Comunquese con un mdico si: Nota que los sntomas no mejoran con Technical sales engineer. Tiene nuevos sntomas. Busque ayuda de inmediato si: Tiene un dolor intenso en el abdomen que no mejora con el tratamiento. Tiene nuseas que son intensas o que no desaparecen. Vomita cada vez que bebe lquidos. Resumen La gastroparesia es una afeccin a largo plazo (crnica) en la que los alimentos permanecen en el estmago durante ms tiempo del normal. Los sntomas incluyen nuseas, vmitos, Merchant navy officer, distensin del abdomen y prdida del apetito. Consumir porciones ms pequeas, alimentos con bajo contenido de grasas y las formas bajas en fibras de los alimentos altos en fibra puede ayudar a Chief Operating Officer los sntomas. Busque ayuda de inmediato si tiene dolor intenso en el abdomen. Esta informacin no tiene Theme park manager el consejo del mdico. Asegrese de hacerle al mdico cualquier pregunta que tenga. Document Revised: 11/05/2019 Document Reviewed: 11/05/2019 Elsevier Patient Education  2024 Elsevier Inc. Document Revised: 11/07/2022 Document Reviewed: 11/07/2022 Elsevier Patient Education  2024 [employer].

## 2023-08-10 NOTE — Progress Notes (Signed)
 I agree with the assessment and plan as outlined by Ms. Steffanie Dunn.

## 2023-08-14 ENCOUNTER — Other Ambulatory Visit: Payer: Self-pay

## 2023-08-14 DIAGNOSIS — D509 Iron deficiency anemia, unspecified: Secondary | ICD-10-CM

## 2023-08-14 DIAGNOSIS — Z1211 Encounter for screening for malignant neoplasm of colon: Secondary | ICD-10-CM

## 2023-08-14 MED ORDER — NA SULFATE-K SULFATE-MG SULF 17.5-3.13-1.6 GM/177ML PO SOLN
1.0000 | Freq: Once | ORAL | 0 refills | Status: AC
  Filled 2023-08-14 – 2023-08-29 (×2): qty 354, 1d supply, fill #0

## 2023-08-23 ENCOUNTER — Other Ambulatory Visit: Payer: Self-pay

## 2023-08-28 ENCOUNTER — Ambulatory Visit (HOSPITAL_BASED_OUTPATIENT_CLINIC_OR_DEPARTMENT_OTHER): Payer: Self-pay | Admitting: Family Medicine

## 2023-08-29 ENCOUNTER — Other Ambulatory Visit: Payer: Self-pay

## 2023-08-30 ENCOUNTER — Other Ambulatory Visit: Payer: Self-pay

## 2023-08-31 ENCOUNTER — Telehealth: Payer: Self-pay

## 2023-08-31 NOTE — Telephone Encounter (Signed)
 I have left a message for pts daughter/contact designee, advising of benign PAP smear. Also, per NP Melisa Spray, no additional recommendations are warranted and no further PAPs are recommended given pts age. Pt's daughter advised to call us  back if she has additional questions.

## 2023-09-07 ENCOUNTER — Ambulatory Visit: Payer: Self-pay | Admitting: Internal Medicine

## 2023-09-07 ENCOUNTER — Other Ambulatory Visit: Payer: Self-pay

## 2023-09-07 ENCOUNTER — Encounter: Payer: Self-pay | Admitting: Internal Medicine

## 2023-09-07 VITALS — BP 116/65 | HR 75 | Temp 98.2°F | Resp 13 | Ht 61.0 in | Wt 167.0 lb

## 2023-09-07 DIAGNOSIS — K319 Disease of stomach and duodenum, unspecified: Secondary | ICD-10-CM

## 2023-09-07 DIAGNOSIS — Z1211 Encounter for screening for malignant neoplasm of colon: Secondary | ICD-10-CM

## 2023-09-07 DIAGNOSIS — K648 Other hemorrhoids: Secondary | ICD-10-CM

## 2023-09-07 DIAGNOSIS — K2971 Gastritis, unspecified, with bleeding: Secondary | ICD-10-CM

## 2023-09-07 DIAGNOSIS — Z860101 Personal history of adenomatous and serrated colon polyps: Secondary | ICD-10-CM

## 2023-09-07 DIAGNOSIS — D509 Iron deficiency anemia, unspecified: Secondary | ICD-10-CM

## 2023-09-07 DIAGNOSIS — K2980 Duodenitis without bleeding: Secondary | ICD-10-CM

## 2023-09-07 DIAGNOSIS — D123 Benign neoplasm of transverse colon: Secondary | ICD-10-CM

## 2023-09-07 DIAGNOSIS — Z980 Intestinal bypass and anastomosis status: Secondary | ICD-10-CM

## 2023-09-07 DIAGNOSIS — C189 Malignant neoplasm of colon, unspecified: Secondary | ICD-10-CM

## 2023-09-07 DIAGNOSIS — K3189 Other diseases of stomach and duodenum: Secondary | ICD-10-CM

## 2023-09-07 MED ORDER — PANTOPRAZOLE SODIUM 40 MG PO TBEC
40.0000 mg | DELAYED_RELEASE_TABLET | Freq: Every day | ORAL | 3 refills | Status: AC
Start: 2023-09-07 — End: ?
  Filled 2023-09-07: qty 90, 90d supply, fill #0
  Filled 2024-02-21: qty 90, 90d supply, fill #1
  Filled 2024-04-02: qty 90, 90d supply, fill #2

## 2023-09-07 MED ORDER — SODIUM CHLORIDE 0.9 % IV SOLN: 500.0000 mL | Freq: Once | INTRAVENOUS | Status: DC

## 2023-09-07 MED ORDER — IRON 325 (65 FE) MG PO TABS
325.0000 mg | ORAL_TABLET | Freq: Every day | ORAL | 0 refills | Status: DC
  Filled 2023-09-07: qty 30, 30d supply, fill #0

## 2023-09-07 NOTE — Patient Instructions (Addendum)
 Awaiting pathology results Use Protonix  40 mg once daily Start Ferrous Sulfate  325 mg daily  Avoid nonsteroidal anti-inflammatory drugs such as Ibuprofen, Goody's powders. Take Tylenol  if needed for pain or fever Return to GI office in 3 months at scheduled appointment.  YOU HAD AN ENDOSCOPIC PROCEDURE TODAY AT THE [facility]:   Refer to the procedure report that was given to you for any specific questions about what was found during the examination.  If the procedure report does not answer your questions, please call your gastroenterologist to clarify.  If you requested that your care partner not be given the details of your procedure findings, then the procedure report has been included in a sealed envelope for you to review at your convenience later.  YOU SHOULD EXPECT: Some feelings of bloating in the abdomen. Passage of more gas than usual.  Walking can help get rid of the air that was put into your GI tract during the procedure and reduce the bloating. If you had a lower endoscopy (such as a colonoscopy or flexible sigmoidoscopy) you may notice spotting of blood in your stool or on the toilet paper. If you underwent a bowel prep for your procedure, you may not have a normal bowel movement for a few days.  Please Note:  You might notice some irritation and congestion in your nose or some drainage.  This is from the oxygen used during your procedure.  There is no need for concern and it should clear up in a day or so.  SYMPTOMS TO REPORT IMMEDIATELY:  Following lower endoscopy (colonoscopy or flexible sigmoidoscopy):  Excessive amounts of blood in the stool  Significant tenderness or worsening of abdominal pains  Swelling of the abdomen that is new, acute  Fever of 100F or higher  Following upper endoscopy (EGD)  Vomiting of blood or coffee ground material  New chest pain or pain under the shoulder blades  Painful or persistently difficult swallowing  New shortness of  breath  Fever of 100F or higher  Black, tarry-looking stools  For urgent or emergent issues, a gastroenterologist can be reached at any hour by calling [phone]. Do not use MyChart messaging for urgent concerns.    DIET:  We do recommend a small meal at first, but then you may proceed to your regular diet.  Drink plenty of fluids but you should avoid alcoholic beverages for 24 hours.  ACTIVITY:  You should plan to take it easy for the rest of today and you should NOT DRIVE or use heavy machinery until tomorrow (because of the sedation medicines used during the test).    FOLLOW UP: Our staff will call the number listed on your records the next business day following your procedure.  We will call around 7:15- 8:00 am to check on you and address any questions or concerns that you may have regarding the information given to you following your procedure. If we do not reach you, we will leave a message.     If any biopsies were taken you will be contacted by phone or by letter within the next 1-3 weeks.  Please call us  at [phone] if you have not heard about the biopsies in 3 weeks.    SIGNATURES/CONFIDENTIALITY: You and/or your care partner have signed paperwork which will be entered into your electronic medical record.  These signatures attest to the fact that that the information above on your After Visit Summary has been reviewed and is understood.  Full responsibility  of the confidentiality of this discharge information lies with you and/or your care-partner. USTED TUVO UN PROCEDIMIENTO ENDOSCPICO HOY EN EL [facility]:   Lea el informe del procedimiento que se le entreg para cualquier pregunta especfica sobre lo que se Dentist.  Si el informe del examen no responde a sus preguntas, por favor llame a su gastroenterlogo para aclararlo.  Si usted solicit que no se le den [employer] de lo que se Clinical cytogeneticist en su procedimiento al [employer] va a  cuidar, entonces el informe del procedimiento se ha incluido en un sobre sellado para que usted lo revise despus cuando le sea ms conveniente.   LO QUE PUEDE ESPERAR: Algunas sensaciones de hinchazn en el abdomen.  Puede tener ms gases de lo normal.  El caminar puede ayudarle a eliminar el aire que se le puso en el tracto gastrointestinal durante el procedimiento y reducir la hinchazn.  Si le hicieron una endoscopia inferior (como una colonoscopia o una sigmoidoscopia flexible), podra notar manchas de sangre en las heces fecales o en el papel higinico.  Si se someti a una preparacin intestinal para su procedimiento, es posible que no tenga una evacuacin intestinal normal durante [employer].   Tenga en cuenta:  Es posible que note un poco de irritacin y congestin en la nariz o algn drenaje.  Esto es debido al oxgeno [employer] durante su procedimiento.  No hay que preocuparse y esto debe desaparecer ms o Regulatory affairs officer.   SNTOMAS PARA REPORTAR INMEDIATAMENTE:  Despus de una endoscopia inferior (colonoscopia o sigmoidoscopia flexible):  Cantidades excesivas de sangre en las heces fecales  Sensibilidad significativa o empeoramiento de los dolores abdominales   Hinchazn aguda del abdomen que antes no tena   Fiebre de 100F o ms   Despus de la endoscopia superior (EGD)  Vmitos de Retail buyer o material como caf molido   Dolor en el pecho o dolor debajo de los omplatos que antes no tena   Dolor o dificultad persistente para tragar  Falta de aire que antes no tena   Fiebre de 100F o ms  Heces fecales negras y pegajosas   Para asuntos urgentes o de Associate Professor, puede comunicarse con un gastroenterlogo a cualquier hora llamando al [phone].  DIETA:  Recomendamos una comida pequea al principio, pero luego puede continuar con su dieta normal.  Tome muchos lquidos, pero debe evitar las bebidas alcohlicas durante 24 horas.    ACTIVIDAD:  Debe planear tomarse las cosas  con calma por el resto del da y no debe CONDUCIR ni usar maquinaria pesada Patent examiner (debido a los medicamentos de sedacin utilizados durante el examen).     SEGUIMIENTO: Nuestro personal llamar al nmero que aparece en su historial al siguiente da hbil de su procedimiento para ver cmo se siente y para responder cualquier pregunta o inquietud que pueda tener con respecto a la informacin que se le dio despus del procedimiento. Si no podemos contactarle, le dejaremos un mensaje.  Sin embargo, si se siente bien y no tiene English as a second language teacher, no es necesario que nos devuelva la llamada.  Asumiremos que ha regresado a sus actividades diarias normales sin incidentes. Si se le tomaron algunas biopsias, le contactaremos por telfono o por carta en las prximas 3 semanas.  Si no ha sabido [employer] biopsias en el transcurso de 3 semanas, por favor llmenos al [phone].   FIRMAS/CONFIDENCIALIDAD: Usted y/o el acompaante que le cuide han firmado documentos  que se ingresarn en su historial mdico electrnico.  Estas firmas atestiguan el hecho de que la informacin anterior

## 2023-09-07 NOTE — Op Note (Signed)
 [facility] Patient Name: Julia Knight Procedure Date: 09/07/2023 2:36 PM MRN: [id number] Endoscopist: Freada Jacobs [place] , , [id number] Age: 73 Referring MD:  Date of Birth: 01-Feb-1951 Gender: Female Account #: [id number] Procedure:                Upper GI endoscopy Indications:              Iron  deficiency anemia Medicines:                Monitored Anesthesia Care Procedure:                Pre-Anesthesia Assessment:                           - Prior to the procedure, a History and Physical                            was performed, and patient medications and                            allergies were reviewed. The patient's tolerance of                            previous anesthesia was also reviewed. The risks                            and benefits of the procedure and the sedation                            options and risks were discussed with the patient.                            All questions were answered, and informed consent                            was obtained. Prior Anticoagulants: The patient has                            taken no anticoagulant or antiplatelet agents. ASA                            Grade Assessment: III - A patient with severe                            systemic disease. After reviewing the risks and                            benefits, the patient was deemed in satisfactory                            condition to undergo the procedure.                           After obtaining informed consent, the endoscope was  passed under direct vision. Throughout the                            procedure, the patient's blood pressure, pulse, and                            oxygen saturations were monitored continuously. The                            GIF HQ190 #[id number] was introduced through the                            mouth, and advanced to the second part of duodenum.                            The upper  GI endoscopy was accomplished without                            difficulty. The patient tolerated the procedure                            well. Scope In: Scope Out: Findings:                 The examined esophagus was normal.                           Localized mild inflammation with hemorrhage                            characterized by congestion (edema), erosions and                            erythema was found in the gastric fundus and in the                            gastric body. Biopsies were taken with a cold                            forceps for histology.                           Localized mild inflammation characterized by                            congestion (edema), erosions and erythema was found                            in the duodenal bulb. Complications:            No immediate complications. Estimated Blood Loss:     Estimated blood loss was minimal. Impression:               - Normal esophagus.                           - Gastritis  with hemorrhage. Biopsied.                           - Duodenitis. Recommendation:           - Await pathology results.                           - Use Protonix  (pantoprazole ) 40 mg PO daily.                           - Start ferrous sulfate  325 mg daily if patient is                            not already on an iron  supplement.                           - Avoid NSAIDs if patient is taking.                           - Return to GI clinic in 3 months.                           - Perform a colonoscopy today. Dr Pedro Bourgeois "Anastacio Balm" Rosaline Coma,  09/07/2023 3:13:48 PM

## 2023-09-07 NOTE — Op Note (Signed)
 [facility] Patient Name: Julia Knight Procedure Date: 09/07/2023 2:35 PM MRN: [id number] Endoscopist: Pedro Bourgeois , , [id number] Age: 73 Referring MD:  Date of Birth: 11-May-1950 Gender: Female Account #: [id number] Procedure:                Colonoscopy Indications:              High risk colon cancer surveillance: Personal                            history of colon cancer, Incidental - Iron                             deficiency anemia Medicines:                Monitored Anesthesia Care Procedure:                Pre-Anesthesia Assessment:                           - Prior to the procedure, a History and Physical                            was performed, and patient medications and                            allergies were reviewed. The patient's tolerance of                            previous anesthesia was also reviewed. The risks                            and benefits of the procedure and the sedation                            options and risks were discussed with the patient.                            All questions were answered, and informed consent                            was obtained. Prior Anticoagulants: The patient has                            taken no anticoagulant or antiplatelet agents. ASA                            Grade Assessment: III - A patient with severe                            systemic disease. After reviewing the risks and                            benefits, the patient was deemed in satisfactory  condition to undergo the procedure.                           After obtaining informed consent, the colonoscope                            was passed under direct vision. Throughout the                            procedure, the patient's blood pressure, pulse, and                            oxygen saturations were monitored continuously. The                            Olympus CF-[id number] ([id number])  Colonoscope was                            introduced through the anus and advanced to the the                            terminal ileum. The colonoscopy was performed                            without difficulty. The patient tolerated the                            procedure well. The quality of the bowel                            preparation was good. The terminal ileum, ileocecal                            valve, appendiceal orifice, and rectum were                            photographed. Scope In: 2:53:15 PM Scope Out: 3:07:36 PM Scope Withdrawal Time: 0 hours 9 minutes 3 seconds  Total Procedure Duration: 0 hours 14 minutes 21 seconds  Findings:                 The neo-terminal ileum appeared normal.                           There was evidence of a prior side-to-side                            ileo-colonic anastomosis in the transverse colon.                            This was patent and was characterized by healthy                            appearing mucosa. The anastomosis was traversed.  A 3 mm polyp was found in the transverse colon. The                            polyp was sessile. The polyp was removed with a                            cold snare. Resection and retrieval were complete.                           Non-bleeding internal hemorrhoids were found during                            retroflexion. Complications:            No immediate complications. Estimated Blood Loss:     Estimated blood loss was minimal. Impression:               - The examined portion of the ileum was normal.                           - Patent side-to-side ileo-colonic anastomosis,                            characterized by healthy appearing mucosa.                           - One 3 mm polyp in the transverse colon, removed                            with a cold snare. Resected and retrieved.                           - Non-bleeding internal  hemorrhoids. Recommendation:           - Discharge patient to home (with escort).                           - Await pathology results.                           - The findings and recommendations were discussed                            with the patient. Dr Pedro Bourgeois "Anastacio Balm" Rosaline Coma,  09/07/2023 3:17:00 PM

## 2023-09-07 NOTE — Progress Notes (Signed)
 Pt sedate, gd SR's, VSS, report to RN

## 2023-09-07 NOTE — Progress Notes (Signed)
 Called to room to assist during endoscopic procedure.  Patient ID and intended procedure confirmed with present staff. Received instructions for my participation in the procedure from the performing physician.

## 2023-09-07 NOTE — Progress Notes (Signed)
 Pt's states no medical or surgical changes since previsit or office visit.

## 2023-09-07 NOTE — Progress Notes (Signed)
 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  The nature of the procedure, as well as the risks, benefits, and alternatives were carefully and thoroughly reviewed with the patient. Ample time for discussion and questions allowed. The patient understood, was satisfied, and agreed to proceed.     HPI: Julia Knight is a 73 y.o. female who presents for EGD/colonoscopy for evaluation of history of colonic adenocarcinoma s/p right colon resection, IDA.  Patient was most recently seen in the Gastroenterology Clinic on 08/10/23.  No interval change in medical history since that appointment. Please refer to that note for full details regarding GI history and clinical presentation.   Past Medical History:  Diagnosis Date   Acute appendicitis 08/09/2013   Acute appendicitis with rupture 08/09/2013   Acute blood loss anemia 09/10/2020   Allergic rhinitis 08/24/2011   Allergy    Anxiety    Arthritis    back and hands   BMI 34.0-34.9,adult 05/22/2020   Cancer (HCC) 2024   colon per patient   Constipation due to opioid therapy 09/18/2020   COVID-19 11/16/2018   Depression    Diabetes mellitus without complication (HCC)    type 2   Dizziness 08/16/2017   Encounter for medical examination to establish care 07/24/2020   Gastroenteritis, infectious, presumed 08/16/2017   GERD (gastroesophageal reflux disease)    Glossitis 09/10/2020   Hyperlipemia    Hypertension    Left knee pain 02/28/2011   Left leg pain 08/14/2012   Left-sided low back pain with sciatica 10/09/2012   Midline low back pain with right-sided sciatica 02/01/2022   Neuromuscular disorder (HCC)    Postoperative anemia due to acute blood loss  09/03/2020   EBL 600cc received 300 in cell saver blood  Hgb 7.4-7.3 post op day #1        Pyelonephritis 04/25/2013   UTI (urinary tract infection) 06/03/2013    Past Surgical History:  Procedure Laterality Date   APPENDECTOMY     COLON SURGERY  12/2022   Laparoscopic Right Hemicolectomy   TONSILLECTOMY     removed as an adult   UPPER GASTROINTESTINAL ENDOSCOPY  10/13/2022    Prior to Admission medications   Medication Sig Start Date End Date Taking? Authorizing Provider  amLODipine  (NORVASC ) 5 MG tablet Take 1 tablet (5 mg total) by mouth daily. 07/27/23  Yes Caudle, Arcola Kocher, FNP  atorvastatin  (LIPITOR) 40 MG tablet Take 1 tablet (40 mg total) by mouth at bedtime. 07/27/23  Yes Caudle, Arcola Kocher, FNP  calcium  carbonate (OSCAL) 1500 (600 Ca) MG TABS tablet Take 600 mg of elemental calcium  by mouth in the morning.   Yes [provider]  Glucosamine Sulfate 500 MG TABS Take 500 mg by mouth in the morning and at bedtime. 05/09/13  Yes [provider]  metFORMIN  (GLUCOPHAGE -XR) 500 MG 24 hr tablet Take 1 tablet (500 mg total) by mouth 2 (two) times daily with a meal. 07/27/23  Yes Caudle, Arcola Kocher, FNP  traZODone  (DESYREL ) 100 MG tablet Take 1 tablet (100 mg total) by mouth at bedtime. 08/07/23 10/06/23 Yes Diedra Fowler, MD  docusate sodium  (COLACE) 100 MG capsule Take 100 mg by mouth daily as needed for mild constipation.    [provider]  famotidine  (PEPCID ) 20 MG tablet Take 1 tablet (20 mg total) by mouth 2 (two) times daily. 08/10/23   Collier, Amanda R, PA-C  glipiZIDE  (GLUCOTROL ) 5 MG tablet Take 1 tablet (5 mg total) by mouth daily before breakfast. Patient not taking: Reported on 09/07/2023 07/27/23   Caudle, Alexis Olivia, FNP  Multiple Vitamin (MULTIVITAMIN WITH MINERALS) TABS tablet Take 1 tablet by mouth in the morning.    [provider]  ondansetron  (ZOFRAN ) 4 MG tablet Take 1 tablet (4 mg total) by mouth every 8 (eight) hours as  needed for nausea or vomiting. 11/24/22   Diedra Fowler, MD    Current Outpatient Medications  Medication Sig Dispense Refill   amLODipine  (NORVASC ) 5 MG tablet Take 1 tablet (5 mg total) by mouth daily. 90 tablet 3   atorvastatin  (LIPITOR) 40 MG tablet Take 1 tablet (40 mg total) by mouth at bedtime. 90 tablet 3   calcium  carbonate (OSCAL) 1500 (600 Ca) MG TABS tablet Take 600 mg of elemental calcium  by mouth in the morning.     Glucosamine Sulfate 500 MG TABS Take 500 mg by mouth in the morning and at bedtime.     metFORMIN  (GLUCOPHAGE -XR) 500 MG 24 hr tablet Take 1 tablet (500 mg total) by mouth 2 (two) times daily with a meal. 60 tablet 3   traZODone  (DESYREL ) 100 MG tablet Take 1 tablet (100 mg total) by mouth at bedtime. 30 tablet 1   docusate sodium  (COLACE) 100 MG capsule Take 100 mg by mouth daily as needed for mild constipation.     famotidine  (PEPCID ) 20 MG tablet Take 1 tablet (20 mg total) by mouth 2 (two) times daily. 180 tablet 3   glipiZIDE  (GLUCOTROL ) 5 MG tablet Take 1 tablet (5 mg total) by mouth daily before breakfast. (Patient not taking: Reported on 09/07/2023) 90 tablet 3   Multiple Vitamin (MULTIVITAMIN WITH MINERALS) TABS tablet Take 1 tablet by mouth in the morning.     ondansetron  (ZOFRAN ) 4 MG tablet Take 1 tablet (4 mg total) by mouth every 8 (eight) hours as needed for nausea or vomiting. 20 tablet 0   Current Facility-Administered Medications  Medication Dose Route Frequency Provider Last Rate Last Admin   0.9 %  sodium chloride  infusion  500 mL Intravenous Once Altair Appenzeller C, MD        Allergies as of 09/07/2023   (No Known Allergies)    Family History  Problem Relation Age of Onset   Diabetes Brother    Colon cancer Neg Hx    Esophageal cancer Neg Hx    Rectal cancer Neg Hx    Stomach cancer Neg Hx    Breast cancer Neg Hx     Social History   Socioeconomic History   Marital status: Widowed    Spouse name: Not on file   Number of children: 5    Years of education: Not on file   Highest education level: Not on file  Occupational History   Not on file  Tobacco Use   Smoking status: Former    Types: Cigarettes    Passive exposure: Past   Smokeless tobacco: Never  Vaping Use   Vaping status: Never Used  Substance and Sexual Activity   Alcohol use: Never   Drug use: Never   Sexual activity: Not Currently    Birth control/protection: Post-menopausal  Other Topics Concern   Not on file  Social History Narrative   Pt was born in [place]   **  Merged History Encounter **    Social Drivers of Health   Financial Resource Strain: Low Risk  (04/12/2023)   Overall Financial Resource Strain (CARDIA)    Difficulty of Paying Living Expenses: Not very hard  Food Insecurity: No Food Insecurity (06/22/2023)   Hunger Vital Sign    Worried About Running Out of Food in the Last Year: Never true    Ran Out of Food in the Last Year: Never true  Transportation Needs: No Transportation Needs (06/22/2023)   PRAPARE - Administrator, Civil Service (Medical): No    Lack of Transportation (Non-Medical): No  Physical Activity: Sufficiently Active (04/12/2023)   Exercise Vital Sign    Days of Exercise per Week: 4 days    Minutes of Exercise per Session: 40 min  Stress: No Stress Concern Present (04/12/2023)   [employer] of Occupational Health - Occupational Stress Questionnaire    Feeling of Stress : Only a little  Social Connections: Socially Isolated (05/19/2023)   Social Connection and Isolation Panel [NHANES]    Frequency of Communication with Friends and Family: More than three times a week    Frequency of Social Gatherings with Friends and Family: More than three times a week    Attends Religious Services: Never    Database administrator or Organizations: No    Attends Banker Meetings: Never    Marital Status: Widowed  Intimate Partner Violence: Not At Risk (05/19/2023)   Humiliation, Afraid, Rape, and Kick  questionnaire    Fear of Current or Ex-Partner: No    Emotionally Abused: No    Physically Abused: No    Sexually Abused: No    Physical Exam: Vital signs in last 24 hours: BP (!) 148/91   Pulse 77   Temp 98.2 F (36.8 C) (Temporal)   Ht 5\' 1"  (1.549 m)   Wt 167 lb (75.8 kg)   SpO2 96%   BMI 31.55 kg/m  GEN: NAD EYE: Sclerae anicteric ENT: MMM CV: Non-tachycardic Pulm: No increased WOB GI: Soft NEURO:  Alert & Oriented   Regino Caprio, MD [facility]   09/07/2023 1:44 PM

## 2023-09-08 ENCOUNTER — Telehealth: Payer: Self-pay

## 2023-09-08 NOTE — Telephone Encounter (Signed)
  Follow up Call-     09/07/2023    1:06 PM 11/15/2022    1:13 PM 10/13/2022    2:49 PM  Call back number  Post procedure Call Back phone  # [phone]- Armstead Bertrand [phone]  Permission to leave phone message Yes Yes Yes     Patient questions:  Do you have a fever, pain , or abdominal swelling? No. Pain Score  0 *  Have you tolerated food without any problems? Yes.    Have you been able to return to your normal activities? Yes.    Do you have any questions about your discharge instructions: Diet   No. Medications  No. Follow up visit  No.  Do you have questions or concerns about your Care? No.  Actions: * If pain score is 4 or above: No action needed, pain <4.

## 2023-09-11 ENCOUNTER — Other Ambulatory Visit: Payer: Self-pay

## 2023-09-12 LAB — SURGICAL PATHOLOGY

## 2023-09-18 ENCOUNTER — Encounter: Payer: Self-pay | Admitting: Internal Medicine

## 2023-09-27 ENCOUNTER — Ambulatory Visit (HOSPITAL_BASED_OUTPATIENT_CLINIC_OR_DEPARTMENT_OTHER): Payer: Self-pay | Admitting: Family Medicine

## 2023-11-06 ENCOUNTER — Ambulatory Visit: Payer: Self-pay | Admitting: Orthopedic Surgery

## 2023-11-08 NOTE — Progress Notes (Deleted)
 11/08/2023 Julia Knight [id number] 12/06/1950  Referring provider: Knute Thersia Bitters, * Primary GI doctor: Dr. Federico  ASSESSMENT AND PLAN:   GERD with history of epigastric pain CT abdomen/pelvis 3/32/2024 unremarkable 10/27/2022 HIDA normal 10/13/2022 EGD esophagitis without bleeding, gastritis, normal duodenum negative for infection, autoimmune disease, esophageal changes in setting of reflux CTA in ER negative 06/21/2023 H. pylori breath test negative -Symptoms have resolved if she has pepcid  20 mg BID -09/07/2023 EGD for IDA normal esophagus, gastritis with hemorrhage, duodenitis, negative H. pylori gastritis - Continue Pepcid  twice daily - Given information about GERD in spanish -  consider GES  IDA 08/10/2023  HGB 11.4 MCV 82.2 Platelets 316.0 08/10/2023 Iron  34 Ferritin 6.7  Recent Labs    05/12/23 0957 05/17/23 0634 05/18/23 2302 05/20/23 0638 06/07/23 1713 06/12/23 1623 06/21/23 1150 08/10/23 1122  HGB 11.0* 8.7* 8.1* 7.6* 9.7* 9.9* 10.6* 11.4*  09/07/2023 EGD showed non-H. pylori gastritis with hemorrhage  09/07/2023 colonoscopy 3 mm TA polyp normal anastomosis, nonbleeding internal hemorrhoids  History of colon cancer 11/15/2022 colonoscopy bowel prep good normal TI 12 mm polyp ascending colon nonbleeding internal hemorrhoids Path invasive mod diff colonic adenocarcinoma 01/02/2023 status post right laparoscopic colon resection with Dr. Janetta 09/07/2023 colonoscopy 3 mm TA polyp normal anastomosis, nonbleeding internal hemorrhoids Recall colonoscopy 3 years or 09/07/2026  Fatty liver seen on CT 12/07/2022 06/2023 alk phos 203, GGT negative Normal platelets no evidence of portal hypertension Negative hepatitis C, B 2022 did not have hepatitis B immunity at that time    Latest Ref Rng & Units 08/10/2023   11:22 AM 06/21/2023   11:50 AM 06/12/2023    4:23 PM  Hepatic Function  Total Protein 6.0 - 8.3 g/dL 8.1  7.6  7.8   Albumin  3.5 - 5.2 g/dL 4.5  4.5   4.0   AST 0 - 37 U/L 10  7  8    ALT 0 - 35 U/L 15  10  7    Alk Phosphatase 39 - 117 U/L 168  203  164   Total Bilirubin 0.2 - 1.2 mg/dL 0.4  0.2  0.3   - suggest hep B vaccination -monitor CBC/LFTs every 6 months -Weight loss discussed  Patient Care Team: Knute Thersia Bitters, FNP as PCP - General (Family Medicine)  HISTORY OF PRESENT ILLNESS: 73 y.o. female with a past medical history of anxiety, depression, arthritis, hypertension, hyperlipidemia and GERD and others listed below presents for evaluation of nausea and vomiting.   Patient was last seen by myself 08/10/2023 nausea and vomiting and epigastric discomfort.  She had iron  deficiency anemia with iron  34, ferritin 6.7.  Due to IDA patient scheduled for EGD colonoscopy which was unremarkable  Discussed the use of AI scribe software for clinical note transcription with the patient, who gave verbal consent to proceed.  History of Present Illness            She  reports that she has quit smoking. Her smoking use included cigarettes. She has been exposed to tobacco smoke. She has never used smokeless tobacco. She reports that she does not drink alcohol and does not use drugs.  RELEVANT GI HISTORY, IMAGING AND LABS: Results          CBC    Component Value Date/Time   WBC 10.4 08/10/2023 1122   RBC 4.26 08/10/2023 1122   HGB 11.4 (L) 08/10/2023 1122   HGB 10.6 (L) 06/21/2023 1150   HCT 35.0 (L) 08/10/2023 1122  HCT 33.6 (L) 06/21/2023 1150   PLT 316.0 08/10/2023 1122   PLT 314 06/21/2023 1150   MCV 82.2 08/10/2023 1122   MCV 85 06/21/2023 1150   MCH 26.9 06/21/2023 1150   MCH 27.0 06/12/2023 1623   MCHC 32.6 08/10/2023 1122   RDW 15.0 08/10/2023 1122   RDW 12.5 06/21/2023 1150   LYMPHSABS 3.0 08/10/2023 1122   LYMPHSABS 2.6 06/21/2023 1150   MONOABS 0.8 08/10/2023 1122   EOSABS 0.3 08/10/2023 1122   EOSABS 0.4 06/21/2023 1150   BASOSABS 0.1 08/10/2023 1122   BASOSABS 0.1 06/21/2023 1150   Recent Labs     05/12/23 0957 05/17/23 0634 05/18/23 2302 05/20/23 0638 06/07/23 1713 06/12/23 1623 06/21/23 1150 08/10/23 1122  HGB 11.0* 8.7* 8.1* 7.6* 9.7* 9.9* 10.6* 11.4*    CMP     Component Value Date/Time   NA 138 08/10/2023 1122   NA 137 06/21/2023 1150   K 4.4 08/10/2023 1122   CL 106 08/10/2023 1122   CO2 24 08/10/2023 1122   GLUCOSE 194 (H) 08/10/2023 1122   BUN 16 08/10/2023 1122   BUN 14 06/21/2023 1150   CREATININE 0.99 08/10/2023 1122   CALCIUM  9.9 08/10/2023 1122   PROT 8.1 08/10/2023 1122   PROT 7.6 06/21/2023 1150   ALBUMIN  4.5 08/10/2023 1122   ALBUMIN  4.5 06/21/2023 1150   AST 10 08/10/2023 1122   ALT 15 08/10/2023 1122   ALKPHOS 168 (H) 08/10/2023 1122   BILITOT 0.4 08/10/2023 1122   BILITOT 0.2 06/21/2023 1150   GFRNONAA >60 06/12/2023 1623      Latest Ref Rng & Units 08/10/2023   11:22 AM 06/21/2023   11:50 AM 06/12/2023    4:23 PM  Hepatic Function  Total Protein 6.0 - 8.3 g/dL 8.1  7.6  7.8   Albumin  3.5 - 5.2 g/dL 4.5  4.5  4.0   AST 0 - 37 U/L 10  7  8    ALT 0 - 35 U/L 15  10  7    Alk Phosphatase 39 - 117 U/L 168  203  164   Total Bilirubin 0.2 - 1.2 mg/dL 0.4  0.2  0.3       Current Medications:   Current Outpatient Medications (Endocrine & Metabolic):    glipiZIDE  (GLUCOTROL ) 5 MG tablet, Take 1 tablet (5 mg total) by mouth daily before breakfast. (Patient not taking: Reported on 09/07/2023)   metFORMIN  (GLUCOPHAGE -XR) 500 MG 24 hr tablet, Take 1 tablet (500 mg total) by mouth 2 (two) times daily with a meal.  Current Outpatient Medications (Cardiovascular):    amLODipine  (NORVASC ) 5 MG tablet, Take 1 tablet (5 mg total) by mouth daily.   atorvastatin  (LIPITOR) 40 MG tablet, Take 1 tablet (40 mg total) by mouth at bedtime.    Current Outpatient Medications (Hematological):    Ferrous Sulfate  (IRON ) 325 (65 Fe) MG TABS, Take 1 tablet (325 mg total) by mouth daily.  Current Outpatient Medications (Other):    calcium  carbonate (OSCAL) 1500 (600  Ca) MG TABS tablet, Take 600 mg of elemental calcium  by mouth in the morning.   docusate sodium  (COLACE) 100 MG capsule, Take 100 mg by mouth daily as needed for mild constipation.   famotidine  (PEPCID ) 20 MG tablet, Take 1 tablet (20 mg total) by mouth 2 (two) times daily.   Glucosamine Sulfate 500 MG TABS, Take 500 mg by mouth in the morning and at bedtime.   Multiple Vitamin (MULTIVITAMIN WITH MINERALS) TABS tablet, Take 1 tablet by  mouth in the morning.   ondansetron  (ZOFRAN ) 4 MG tablet, Take 1 tablet (4 mg total) by mouth every 8 (eight) hours as needed for nausea or vomiting.   pantoprazole  (PROTONIX ) 40 MG tablet, Take 1 tablet (40 mg total) by mouth daily.   traZODone  (DESYREL ) 100 MG tablet, Take 1 tablet (100 mg total) by mouth at bedtime.  Medical History:  Past Medical History:  Diagnosis Date   Acute appendicitis 08/09/2013   Acute appendicitis with rupture 08/09/2013   Acute blood loss anemia 09/10/2020   Allergic rhinitis 08/24/2011   Allergy    Anxiety    Arthritis    back and hands   BMI 34.0-34.9,adult 05/22/2020   Cancer (HCC) 2024   colon per patient   Constipation due to opioid therapy 09/18/2020   COVID-19 11/16/2018   Depression    Diabetes mellitus without complication (HCC)    type 2   Dizziness 08/16/2017   Encounter for medical examination to establish care 07/24/2020   Gastroenteritis, infectious, presumed 08/16/2017   GERD (gastroesophageal reflux disease)    Glossitis 09/10/2020   Hyperlipemia    Hypertension    Left knee pain 02/28/2011   Left leg pain 08/14/2012   Left-sided low back pain with sciatica 10/09/2012   Midline low back pain with right-sided sciatica 02/01/2022   Neuromuscular disorder (HCC)    Postoperative anemia due to acute blood loss 09/03/2020   EBL 600cc received 300 in cell saver blood  Hgb 7.4-7.3 post op day #1        Pyelonephritis 04/25/2013   UTI (urinary tract infection) 06/03/2013   Allergies: No Known Allergies    Surgical History:  She  has a past surgical history that includes Appendectomy; Tonsillectomy; Upper gastrointestinal endoscopy (10/13/2022); and Colon surgery (12/2022). Family History:  Her family history includes Diabetes in her brother.  REVIEW OF SYSTEMS  : All other systems reviewed and negative except where noted in the History of Present Illness.  PHYSICAL EXAM: There were no vitals taken for this visit. Physical Exam          Alan JONELLE Coombs, PA-C 3:37 PM

## 2023-11-09 ENCOUNTER — Ambulatory Visit: Payer: Self-pay | Admitting: Physician Assistant

## 2023-11-22 ENCOUNTER — Ambulatory Visit (HOSPITAL_BASED_OUTPATIENT_CLINIC_OR_DEPARTMENT_OTHER): Payer: Self-pay | Admitting: Family Medicine

## 2023-11-24 ENCOUNTER — Other Ambulatory Visit: Payer: Self-pay

## 2023-11-30 ENCOUNTER — Telehealth: Payer: Self-pay | Admitting: Orthopedic Surgery

## 2023-11-30 NOTE — Telephone Encounter (Signed)
 Orthopedic Telephone Note  Called patient since she missed her last follow-up visit.  She is now about 6 months out from surgery.  There is no pickup on the phone so left voicemail.  Would like to see the patient back to see how she is doing now that she is further out from surgery.  Will talk to her on the phone to if she cannot make it in.   [name] Ada, MD Orthopedic Surgeon

## 2023-12-07 ENCOUNTER — Ambulatory Visit: Payer: Self-pay | Admitting: Orthopedic Surgery

## 2023-12-07 ENCOUNTER — Ambulatory Visit: Payer: Self-pay | Admitting: Internal Medicine

## 2023-12-27 ENCOUNTER — Ambulatory Visit (HOSPITAL_BASED_OUTPATIENT_CLINIC_OR_DEPARTMENT_OTHER): Payer: Self-pay | Admitting: Family Medicine

## 2024-01-01 ENCOUNTER — Ambulatory Visit: Payer: Self-pay | Admitting: Gastroenterology

## 2024-01-01 NOTE — Progress Notes (Deleted)
 Chief Complaint:*** Primary GI Doctor:Dr. Federico  HPI: 73 y.o. female with a past medical history of anxiety, depression, arthritis, hypertension, hyperlipidemia and GERD and others listed below presents for evaluation of  Patient last seen in the GI office by Alan, PA on 08/10/2023 for evaluation of nausea and vomiting.  Interval History  Patient admits/denies GERD Patient admits/denies dysphagia Patient admits/denies nausea, vomiting, or weight loss  Patient admits/denies altered bowel habits Patient admits/denies abdominal pain Patient admits/denies rectal bleeding   Denies/Admits alcohol Denies/Admits smoking Denies/Admits NSAID use. Denies/Admits they are on blood thinners.  GI procedures/imaging: 09/07/2023 EGD - Normal esophagus. - Gastritis with hemorrhage. Biopsied. - Duodenitis. Path: 1. Surgical [P], gastric - GASTRIC ANTRAL MUCOSA WITH MILD NONSPECIFIC REACTIVE GASTROPATHY - GASTRIC OXYNTIC MUCOSA WITH NO SPECIFIC HISTOPATHOLOGIC CHANGES - HELICOBACTER PYLORI-LIKE ORGANISMS ARE NOT IDENTIFIED ON ROUTINE H&E STAIN  09/07/23 colonoscopy, recall 3 years - The examined portion of the ileum was normal. - Patent side- to- side ileo- colonic anastomosis, characterized by healthy appearing mucosa. - One 3 mm polyp in the transverse colon, removed with a cold snare. Resected and retrieved. - Non- bleeding internal hemorrhoids. Path: Surgical [P], colon, transverse, polyp (1) - TUBULAR ADENOMA - NEGATIVE FOR HIGH-GRADE DYSPLASIA OR MALIGNANCY  CT abdomen/pelvis 3/32/2024 unremarkable 10/27/2022 HIDA normal 11/15/2022 colonoscopy bowel prep good normal TI 12 mm polyp ascending colon nonbleeding internal hemorrhoids PATH INVASIVE MODERATELY DIFFERENTIATED COLONIC ADENOCARCINA 01/02/2023 status post right laparoscopic colon resection with Dr. Janetta Recall colonoscopy 01/2024 10/13/2022 EGD esophagitis without bleeding, gastritis, normal duodenum negative for infection,  autoimmune disease, esophageal changes in setting of reflux ER visit 06/07/2023 and 06/12/2023 for dyspepsia, nausea, vomiting CT angio abdomen pelvis with contrast showed no CT evidence of gastrointestinal hemorrhage aortic atherosclerosis no acute intra-abdominal or intrapelvic abnormality status post partial colectomy stomach within normal limits bowels unremarkable normal pancreas, spleen, liver, gallbladder 06/21/2023 H. pylori breath test negative   Wt Readings from Last 3 Encounters:  09/07/23 167 lb (75.8 kg)  08/10/23 167 lb 6.4 oz (75.9 kg)  07/27/23 166 lb 3.2 oz (75.4 kg)      Past Medical History:  Diagnosis Date   Acute appendicitis 08/09/2013   Acute appendicitis with rupture 08/09/2013   Acute blood loss anemia 09/10/2020   Allergic rhinitis 08/24/2011   Allergy    Anxiety    Arthritis    back and hands   BMI 34.0-34.9,adult 05/22/2020   Cancer (HCC) 2024   colon per patient   Constipation due to opioid therapy 09/18/2020   COVID-19 11/16/2018   Depression    Diabetes mellitus without complication (HCC)    type 2   Dizziness 08/16/2017   Encounter for medical examination to establish care 07/24/2020   Gastroenteritis, infectious, presumed 08/16/2017   GERD (gastroesophageal reflux disease)    Glossitis 09/10/2020   Hyperlipemia    Hypertension    Left knee pain 02/28/2011   Left leg pain 08/14/2012   Left-sided low back pain with sciatica 10/09/2012   Midline low back pain with right-sided sciatica 02/01/2022   Neuromuscular disorder (HCC)    Postoperative anemia due to acute blood loss 09/03/2020   EBL 600cc received 300 in cell saver blood  Hgb 7.4-7.3 post op day #1        Pyelonephritis 04/25/2013   UTI (urinary tract infection) 06/03/2013    Past Surgical History:  Procedure Laterality Date   APPENDECTOMY     COLON SURGERY  12/2022   Laparoscopic Right Hemicolectomy   TONSILLECTOMY  removed as an adult   UPPER GASTROINTESTINAL ENDOSCOPY   10/13/2022    Current Outpatient Medications  Medication Sig Dispense Refill   amLODipine  (NORVASC ) 5 MG tablet Take 1 tablet (5 mg total) by mouth daily. 90 tablet 3   atorvastatin  (LIPITOR) 40 MG tablet Take 1 tablet (40 mg total) by mouth at bedtime. 90 tablet 3   calcium  carbonate (OSCAL) 1500 (600 Ca) MG TABS tablet Take 600 mg of elemental calcium  by mouth in the morning.     docusate sodium  (COLACE) 100 MG capsule Take 100 mg by mouth daily as needed for mild constipation.     famotidine  (PEPCID ) 20 MG tablet Take 1 tablet (20 mg total) by mouth 2 (two) times daily. 180 tablet 3   Ferrous Sulfate  (IRON ) 325 (65 Fe) MG TABS Take 1 tablet (325 mg total) by mouth daily. 30 tablet 0   glipiZIDE  (GLUCOTROL ) 5 MG tablet Take 1 tablet (5 mg total) by mouth daily before breakfast. (Patient not taking: Reported on 09/07/2023) 90 tablet 3   Glucosamine Sulfate 500 MG TABS Take 500 mg by mouth in the morning and at bedtime.     metFORMIN  (GLUCOPHAGE -XR) 500 MG 24 hr tablet Take 1 tablet (500 mg total) by mouth 2 (two) times daily with a meal. 60 tablet 3   Multiple Vitamin (MULTIVITAMIN WITH MINERALS) TABS tablet Take 1 tablet by mouth in the morning.     ondansetron  (ZOFRAN ) 4 MG tablet Take 1 tablet (4 mg total) by mouth every 8 (eight) hours as needed for nausea or vomiting. 20 tablet 0   pantoprazole  (PROTONIX ) 40 MG tablet Take 1 tablet (40 mg total) by mouth daily. 90 tablet 3   traZODone  (DESYREL ) 100 MG tablet Take 1 tablet (100 mg total) by mouth at bedtime. 30 tablet 1   No current facility-administered medications for this visit.    Allergies as of 01/01/2024   (No Known Allergies)    Family History  Problem Relation Age of Onset   Diabetes Brother    Colon cancer Neg Hx    Esophageal cancer Neg Hx    Rectal cancer Neg Hx    Stomach cancer Neg Hx    Breast cancer Neg Hx     Review of Systems:    Constitutional: No weight loss, fever, chills, weakness or fatigue HEENT: Eyes:  No change in vision               Ears, Nose, Throat:  No change in hearing or congestion Skin: No rash or itching Cardiovascular: No chest pain, chest pressure or palpitations   Respiratory: No SOB or cough Gastrointestinal: See HPI and otherwise negative Genitourinary: No dysuria or change in urinary frequency Neurological: No headache, dizziness or syncope Musculoskeletal: No new muscle or joint pain Hematologic: No bleeding or bruising Psychiatric: No history of depression or anxiety    Physical Exam:  Vital signs: There were no vitals taken for this visit.  Constitutional:   Pleasant *** female/female appears to be in NAD, Well developed, Well nourished, alert and cooperative Eyes:   PEERL, EOMI. No icterus. Conjunctiva pink. Neck:  Supple Throat: Oral cavity and pharynx without inflammation, swelling or lesion.  Respiratory: Respirations even and unlabored. Lungs clear to auscultation bilaterally.   No wheezes, crackles, or rhonchi.  Cardiovascular: Normal S1, S2. Regular rate and rhythm. No peripheral edema, cyanosis or pallor.  Gastrointestinal:  Soft, nondistended, nontender. No rebound or guarding. Normal bowel sounds. No appreciable masses or hepatomegaly. Rectal:  Not performed.  Anoscopy: Msk:  Symmetrical without gross deformities. Without edema, no deformity or joint abnormality.  Neurologic:  Alert and  oriented x4;  grossly normal neurologically.  Skin:   Dry and intact without significant lesions or rashes.  RELEVANT LABS AND IMAGING: CBC    Latest Ref Rng & Units 08/10/2023   11:22 AM 06/21/2023   11:50 AM 06/12/2023    4:23 PM  CBC  WBC 4.0 - 10.5 K/uL 10.4  11.5  11.2   Hemoglobin 12.0 - 15.0 g/dL 88.5  89.3  9.9   Hematocrit 36.0 - 46.0 % 35.0  33.6  31.7   Platelets 150.0 - 400.0 K/uL 316.0  314  341      CMP     Latest Ref Rng & Units 08/10/2023   11:22 AM 06/21/2023   11:50 AM 06/12/2023    4:23 PM  CMP  Glucose 70 - 99 mg/dL 805  688  778   BUN 6 - 23  mg/dL 16  14  18    Creatinine 0.40 - 1.20 mg/dL 9.00  8.83  9.01   Sodium 135 - 145 mEq/L 138  137  136   Potassium 3.5 - 5.1 mEq/L 4.4  4.7  4.4   Chloride 96 - 112 mEq/L 106  99  101   CO2 19 - 32 mEq/L 24  19  25    Calcium  8.4 - 10.5 mg/dL 9.9  9.8  9.6   Total Protein 6.0 - 8.3 g/dL 8.1  7.6  7.8   Total Bilirubin 0.2 - 1.2 mg/dL 0.4  0.2  0.3   Alkaline Phos 39 - 117 U/L 168  203  164   AST 0 - 37 U/L 10  7  8    ALT 0 - 35 U/L 15  10  7       Lab Results  Component Value Date   TSH 1.660 11/03/2022     Assessment: 1. ***  Plan: -Use Protonix  ( pantoprazole ) 40 mg PO daily.  - Start ferrous sulfate  325 mg daily if patient is not already on an iron  supplement.  - Avoid NSAIDs if patient is taking.   Thank you for the courtesy of this consult. Please call me with any questions or concerns.   Tereasa Yilmaz, FNP-C [facility] 01/01/2024, 1:12 PM  Cc: Knute Thersia Bitters, *

## 2024-01-11 ENCOUNTER — Telehealth: Payer: Self-pay | Admitting: Orthopedic Surgery

## 2024-01-11 ENCOUNTER — Ambulatory Visit: Payer: Self-pay | Admitting: Orthopedic Surgery

## 2024-01-11 NOTE — Telephone Encounter (Signed)
 Orthopedic Note  I had previously called the patient and left a voicemail to get her into the office since she was about 6 months out from surgery and missed that follow-up visit.  I was able to get her scheduled for a office visit today on 01/11/2024, but patient did not show up to that office visit.  She is now about 9 months out from surgery.  I would like to see her back to make sure that she is still doing well after surgery.   [name] Ada, MD Orthopedic Surgeon

## 2024-01-31 ENCOUNTER — Other Ambulatory Visit: Payer: Self-pay

## 2024-01-31 ENCOUNTER — Emergency Department (HOSPITAL_BASED_OUTPATIENT_CLINIC_OR_DEPARTMENT_OTHER): Payer: Self-pay | Admitting: Radiology

## 2024-01-31 ENCOUNTER — Ambulatory Visit (HOSPITAL_BASED_OUTPATIENT_CLINIC_OR_DEPARTMENT_OTHER): Payer: Self-pay | Admitting: Family Medicine

## 2024-01-31 ENCOUNTER — Emergency Department (HOSPITAL_BASED_OUTPATIENT_CLINIC_OR_DEPARTMENT_OTHER)
Admission: EM | Admit: 2024-01-31 | Discharge: 2024-01-31 | Disposition: A | Discharge status: Home / Self Care | Payer: Self-pay | Attending: Emergency Medicine | Admitting: Emergency Medicine

## 2024-01-31 ENCOUNTER — Encounter (HOSPITAL_BASED_OUTPATIENT_CLINIC_OR_DEPARTMENT_OTHER): Payer: Self-pay | Admitting: *Deleted

## 2024-01-31 DIAGNOSIS — Z7984 Long term (current) use of oral hypoglycemic drugs: Secondary | ICD-10-CM | POA: Insufficient documentation

## 2024-01-31 DIAGNOSIS — M25562 Pain in left knee: Secondary | ICD-10-CM | POA: Insufficient documentation

## 2024-01-31 DIAGNOSIS — Z79899 Other long term (current) drug therapy: Secondary | ICD-10-CM | POA: Insufficient documentation

## 2024-01-31 MED ORDER — DICLOFENAC SODIUM 1 % EX GEL
4.0000 g | Freq: Four times a day (QID) | CUTANEOUS | 0 refills | Status: AC
  Filled 2024-01-31: qty 100, 6d supply, fill #0

## 2024-01-31 NOTE — ED Triage Notes (Signed)
 Pt caox4, ambulatory with cane, reporting last Friday while doing yardwork when she fell and twisted L leg and has been having pain in L knee since.

## 2024-01-31 NOTE — ED Provider Notes (Signed)
 [place] EMERGENCY DEPARTMENT AT [facility] Provider Note   CSN: [id number] Arrival date & time: 01/31/24  1022     Patient presents with: Leg Pain   Julia Knight is a 73 y.o. female.   Patient with noncontributory past medical history presents today with complaints of fall.  She reports that same occurred 1 week ago when she was mowing the yard and went down a slope and the lawnmower got away from her and she twisted her knee and fell to the ground. She did not hit her head or loose consciousness.  She is not anticoagulated.  Reports since then she has been able to walk but with a cane for assistance which is new for her.  Reports pain is worse on the outside of her knee.  Denies any sensation changes to her leg.  No other injuries or complaints. Has been taking tylenol  with some improvement.  The history is provided by the patient. A language interpreter was used (Patient Spanish-speaking only requiring interpreter services).  Leg Pain      Prior to Admission medications   Medication Sig Start Date End Date Taking? Authorizing Provider  amLODipine  (NORVASC ) 5 MG tablet Take 1 tablet (5 mg total) by mouth daily. 07/27/23   Caudle, Thersia Bitters, FNP  atorvastatin  (LIPITOR) 40 MG tablet Take 1 tablet (40 mg total) by mouth at bedtime. 07/27/23   Caudle, Thersia Bitters, FNP  calcium  carbonate (OSCAL) 1500 (600 Ca) MG TABS tablet Take 600 mg of elemental calcium  by mouth in the morning.    [provider]  docusate sodium  (COLACE) 100 MG capsule Take 100 mg by mouth daily as needed for mild constipation.    [provider]  famotidine  (PEPCID ) 20 MG tablet Take 1 tablet (20 mg total) by mouth 2 (two) times daily. 08/10/23   Craig Alan SAUNDERS, PA-C  Ferrous Sulfate  (IRON ) 325 (65 Fe) MG TABS Take 1 tablet (325 mg total) by mouth daily. 09/07/23   Federico Rosario BROCKS, MD  glipiZIDE  (GLUCOTROL ) 5 MG tablet Take 1 tablet (5 mg total) by mouth daily before  breakfast. Patient not taking: Reported on 09/07/2023 07/27/23   Caudle, Alexis Olivia, FNP  Glucosamine Sulfate 500 MG TABS Take 500 mg by mouth in the morning and at bedtime. 05/09/13   [provider]  metFORMIN  (GLUCOPHAGE -XR) 500 MG 24 hr tablet Take 1 tablet (500 mg total) by mouth 2 (two) times daily with a meal. 07/27/23   Caudle, Thersia Bitters, FNP  Multiple Vitamin (MULTIVITAMIN WITH MINERALS) TABS tablet Take 1 tablet by mouth in the morning.    [provider]  ondansetron  (ZOFRAN ) 4 MG tablet Take 1 tablet (4 mg total) by mouth every 8 (eight) hours as needed for nausea or vomiting. 11/24/22   Georgina Ozell LABOR, MD  pantoprazole  (PROTONIX ) 40 MG tablet Take 1 tablet (40 mg total) by mouth daily. 09/07/23   Federico Rosario BROCKS, MD  traZODone  (DESYREL ) 100 MG tablet Take 1 tablet (100 mg total) by mouth at bedtime. 08/07/23 10/06/23  Georgina Ozell LABOR, MD    Allergies: Patient has no known allergies.    Review of Systems  Musculoskeletal:  Positive for arthralgias and myalgias.  All other systems reviewed and are negative.   Updated Vital Signs BP (!) 160/73 (BP Location: Right Arm)   Pulse 94   Temp 98.3 F (36.8 C) (Oral)   Resp 18   SpO2 96%   Physical Exam Vitals and nursing note reviewed.  Constitutional:      General: She is not in acute distress.    Appearance: Normal appearance. She is normal weight. She is not ill-appearing, toxic-appearing or diaphoretic.  HENT:     Head: Normocephalic and atraumatic.  Cardiovascular:     Rate and Rhythm: Normal rate.  Pulmonary:     Effort: Pulmonary effort is normal. No respiratory distress.  Musculoskeletal:        General: Normal range of motion.     Cervical back: Normal range of motion.     Comments: TTP noted to the lateral left knee.  DP and PT pulses intact and 2+.  Compartments are soft.  No significant swelling, bruising, crepitus, or deformity. No significant ligament laxity. Passive ROM intact with some  discomfort. After bracing, patient observed to be ambulatory with steady gait with cane for assistance.   Skin:    General: Skin is warm and dry.  Neurological:     General: No focal deficit present.     Mental Status: She is alert.  Psychiatric:        Mood and Affect: Mood normal.        Behavior: Behavior normal.     (all labs ordered are listed, but only abnormal results are displayed) Labs Reviewed - No data to display  EKG: None  Radiology: DG Knee Complete 4 Views Left Result Date: 01/31/2024 EXAM: 4 VIEW(S) XRAY OF THE LEFT KNEE 01/31/2024 12:38:30 PM COMPARISON: None available. CLINICAL HISTORY: injury. Table formatting from the original note was not included.; Triage note: ; Pt caox4, ambulatory with cane, reporting last Friday while doing yardwork when she fell and twisted L leg and has been having pain in L knee since FINDINGS: BONES AND JOINTS: No acute fracture. No focal osseous lesion. No joint dislocation. Small suprapatellar joint effusion. Tricompartmental osteophyte formation and mild medial compartment joint space narrowing compatible with degenerative change. SOFT TISSUES: Vascular calcifications present. IMPRESSION: 1. Small suprapatellar joint effusion. 2. Degenerative joint disease. 3. Vascular calcifications. Electronically signed by: Waddell Calk MD 01/31/2024 01:12 PM EDT RP Workstation: [id number]     .Ortho Injury Treatment  Date/Time: 01/31/2024 3:50 PM  Performed by: Nora Lauraine LABOR, PA-C Authorized by: Nora Lauraine LABOR, PA-C   Consent:    Consent obtained:  Verbal   Consent given by:  Patient   Risks discussed:  Nerve damage, restricted joint movement and stiffness   Alternatives discussed:  No treatment, alternative treatment, immobilization, referral and delayed treatmentInjury location: left knee. Pre-procedure neurovascular assessment: neurovascularly intact Pre-procedure distal perfusion: normal Pre-procedure neurological function:  normal Pre-procedure range of motion: reduced  Anesthesia: Local anesthesia used: no  Patient sedated: NoImmobilization: splint Splint type: Hinged knee brace. Splint Applied by: ED Tech Post-procedure neurovascular assessment: post-procedure neurovascularly intact Post-procedure distal perfusion: normal Post-procedure neurological function: normal Post-procedure range of motion: improved      Medications Ordered in the ED - No data to display                                  Medical Decision Making Amount and/or Complexity of Data Reviewed Radiology: ordered.   Halo Zamarripa Valenzuela is a 73 y.o. female who presents with complaints of left knee ankle which occurred 1 week ago. They are afebrile, nontoxic-appearing, and in no acute distress reassuring vital signs. Physical exam reveals TTP noted to the lateral left knee.  DP and PT pulses intact and 2+.  Compartments are soft.  No significant swelling, bruising, crepitus, or deformity. No significant ligament laxity. Passive ROM intact with some discomfort. After bracing, patient observed to be ambulatory with steady gait with cane for assistance.  X-ray imaging ordered and obtained which has resulted and reveals  1. Small suprapatellar joint effusion. 2. Degenerative joint disease. 3. Vascular calcifications.  I have personally reviewed and interpreted this imaging and agree with radiology interpretation  Discussed x-ray findings with patient is understanding and in agreement with this, did discuss potential for underlying injuries such as a ligament injury which would require further evaluation by orthopedics, patient expressed understanding. Patient provided with hinged knee brace for support, offered crutches, she would prefer to use her cane.  Home care instructions including RICE and tylenol  discussed.  Provided with Voltaren  gel prescription for additional support.  Follow up with ortho if symptoms not improving in 1  week.  Referral given for same.  Evaluation and diagnostic testing in the emergency department does not suggest an emergent condition requiring admission or immediate intervention beyond what has been performed at this time.  Plan for discharge with close PCP follow-up.  Patient is understanding and amenable with plan, educated on red flag symptoms that would prompt immediate return.  Patient discharged in stable condition.  Final diagnoses:  Acute pain of left knee    ED Discharge Orders          Ordered    diclofenac  Sodium (VOLTAREN ) 1 % GEL  4 times daily        01/31/24 1606          An After Visit Summary was printed and given to the patient.      Nora Lauraine DELENA DEVONNA 01/31/24 1608    Yolande Lamar BROCKS, MD 02/02/24 [phone]

## 2024-01-31 NOTE — Discharge Instructions (Addendum)
 As we discussed, your workup in the ER today was reassuring for acute findings.  X-ray imaging did not reveal any fracture or dislocation of your knee.  However you could have a sprain or damage to underlying structures such as tendons or ligaments which could cause your discomfort.  We have given you a brace to wear for support and you can use your cane as well as needed.  I recommend the rest, ice, compress, and elevate your leg and take Tylenol  as needed for pain. You can also use topical remedies such as voltaren  gel. I have supplied you with a prescription for this.   Please use acetaminophen  (Tylenol ) for pain.  You may use 1000 mg of acetaminophen  every 6 hours. Do not exceed 4000 mg of acetaminophen  within 24 hours.  Additionally, I have given you a referral to orthopedics with number to call to schedule appoint for follow-up.  Please call as needed if your pain is not improving.  You can take the brace off to shower and sleep.  Return if development of any new or worsening symptoms  Como comentamos, su evaluacin en urgencias hoy fue tranquilizadora en cuanto a hallazgos agudos. Las radiografas no revelaron ninguna fractura ni luxacin de rodilla. Sin embargo, podra Warehouse manager un esguince o dao en estructuras subyacentes, como tendones o ligamentos, que podran causarle molestias. Le hemos proporcionado una frula para que la use como soporte y puede usar su bastn segn sea necesario. Le recomiendo reposo, hielo, compresas, mantener la pierna en alto y tomar Tylenol  segn sea necesario para Chief Technology Officer. Tambin puede usar remedios tpicos como Voltaren  gel. Le he proporcionado una receta para esto.  Por favor, use acetaminofn (Tylenol ) para el dolor. Puede usar 1000 mg de acetaminofn cada 6 horas. No exceda los 4000 mg de acetaminofn en 24 horas.  Adems, le he derivado a un ortopedista con un nmero para llamar y Charity fundraiser una cita de seguimiento. Por favor, llame cuando sea necesario si el  dolor no mejora.  Puede quitarse la frula para ducharse y dormir.  Regrese si presenta sntomas nuevos o empeoran.

## 2024-02-07 ENCOUNTER — Ambulatory Visit (INDEPENDENT_AMBULATORY_CARE_PROVIDER_SITE_OTHER): Payer: Self-pay | Admitting: Physician Assistant

## 2024-02-07 ENCOUNTER — Other Ambulatory Visit: Payer: Self-pay

## 2024-02-07 DIAGNOSIS — M25562 Pain in left knee: Secondary | ICD-10-CM

## 2024-02-07 DIAGNOSIS — M1712 Unilateral primary osteoarthritis, left knee: Secondary | ICD-10-CM

## 2024-02-07 NOTE — Progress Notes (Signed)
 Office Visit Note   Patient: Julia Knight           Date of Birth: 10-25-1950           MRN: [id number] Visit Date: 02/07/2024              Requested by: Knute Thersia Bitters, FNP [address] [place],  [place] [address] PCP: Knute Thersia Bitters, FNP   Assessment & Plan: Visit Diagnoses:  1. Acute pain of left knee   2. Unilateral primary osteoarthritis, left knee     Plan: Impression is acute on chronic left knee pain.  Patient does have a history of underlying osteoarthritis to the left knee which was intermittently symptomatic.  This new injury is very different.  I am concerned she may have torn her meniscus.  Due to the locking, catching and pain trying to bear weight, we have discussed obtaining an MRI to assess for structural abnormalities.  She will follow-up with us  once this has been completed.  I have sent in tramadol  to take in the meantime.  She will also continue to ice and elevate for pain and swelling.  Follow-Up Instructions: Return for f/u after MRI.   Orders:  Orders Placed This Encounter  Procedures   MR Knee Left w/o contrast   No orders of the defined types were placed in this encounter.     Procedures: No procedures performed   Clinical Data: No additional findings.   Subjective: Chief Complaint  Patient presents with   Left Knee - Pain    HPI patient is a pleasant 73 year old female who comes in today following an injury to her left knee.  She was mowing the grass when she slipped and twisted her knee approximately 2 weeks ago.  She has had significant pain to the lateral knee since.  Pain is constant but worse with bearing weight.  She has associated locking, popping and catching.  She has been taking Tylenol  without any relief.  She was seen in the ED where x-rays were obtained.  These were negative for fracture.  She was provided a knee brace.  She does tell me she was experiencing some intermittent  arthritic type pain prior to this new injury but did not have any issues with bearing weight or with activity.  Review of Systems as detailed in HPI.  All others reviewed and are negative.   Objective: Vital Signs: There were no vitals taken for this visit.  Physical Exam well-developed well-nourished female in no acute distress.  Alert and oriented x 3.  Ortho Exam left knee exam: Small effusion.  Range of motion 0 to 90 degrees.  She has exquisite tenderness to the lateral joint line.  She is stable to valgus varus stress.  She is neurovascularly intact distally.  Specialty Comments:  No specialty comments available.  Imaging: No new imaging   PMFS History: Patient Active Problem List   Diagnosis Date Noted   Lumbar radiculopathy 05/16/2023   Neuropathy 04/26/2023   CKD stage 3a, GFR 45-59 ml/min (HCC) 04/12/2023   Pulmonary nodule 12/13/2022   Malignant neoplasm of colon, unspecified (HCC) 12/08/2022   Wellness examination 10/13/2022   Balance problem 09/16/2022   Paresthesias 09/16/2022   Weakness of both arms 09/16/2022   Peripheral polyneuropathy 02/01/2022   Nonalcoholic hepatosteatosis 03/29/2021   Pain of upper abdomen 03/19/2021   Hematemesis with nausea 03/19/2021   Pressure injury of sacral region, stage 2 (HCC) 09/18/2020   Spinal stenosis, 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   Spondylolisthesis at L4-L5 level 09/01/2020    Class: Chronic   Status post lumbar spinal fusion 09/01/2020   Hyperlipidemia 07/24/2020   Spondylolisthesis at L3-L4 level 05/22/2020   Anxiety 06/07/2018   Pre-ulcerative corn or callous 02/10/2017   Leukocytosis 08/09/2013   GERD (gastroesophageal reflux disease) 06/03/2013   Depression 03/08/2012   Primary hypertension 08/24/2011   T2DM (type 2 diabetes mellitus) (HCC) 02/28/2011   Past Medical History:  Diagnosis Date   Acute appendicitis 08/09/2013   Acute appendicitis with rupture 08/09/2013   Acute blood  loss anemia 09/10/2020   Allergic rhinitis 08/24/2011   Allergy    Anxiety    Arthritis    back and hands   BMI 34.0-34.9,adult 05/22/2020   Cancer (HCC) 2024   colon per patient   Constipation due to opioid therapy 09/18/2020   COVID-19 11/16/2018   Depression    Diabetes mellitus without complication (HCC)    type 2   Dizziness 08/16/2017   Encounter for medical examination to establish care 07/24/2020   Gastroenteritis, infectious, presumed 08/16/2017   GERD (gastroesophageal reflux disease)    Glossitis 09/10/2020   Hyperlipemia    Hypertension    Left knee pain 02/28/2011   Left leg pain 08/14/2012   Left-sided low back pain with sciatica 10/09/2012   Midline low back pain with right-sided sciatica 02/01/2022   Neuromuscular disorder (HCC)    Postoperative anemia due to acute blood loss 09/03/2020   EBL 600cc received 300 in cell saver blood  Hgb 7.4-7.3 post op day #1        Pyelonephritis 04/25/2013   UTI (urinary tract infection) 06/03/2013    Family History  Problem Relation Age of Onset   Diabetes Brother    Colon cancer Neg Hx    Esophageal cancer Neg Hx    Rectal cancer Neg Hx    Stomach cancer Neg Hx    Breast cancer Neg Hx     Past Surgical History:  Procedure Laterality Date   APPENDECTOMY     COLON SURGERY  12/2022   Laparoscopic Right Hemicolectomy   TONSILLECTOMY     removed as an adult   UPPER GASTROINTESTINAL ENDOSCOPY  10/13/2022   Social History   Occupational History   Not on file  Tobacco Use   Smoking status: Former    Types: Cigarettes    Passive exposure: Past   Smokeless tobacco: Never  Vaping Use   Vaping status: Never Used  Substance and Sexual Activity   Alcohol use: Never   Drug use: Never   Sexual activity: Not Currently    Birth control/protection: Post-menopausal

## 2024-02-08 ENCOUNTER — Ambulatory Visit: Payer: Self-pay | Admitting: Orthopedic Surgery

## 2024-02-21 ENCOUNTER — Other Ambulatory Visit: Payer: Self-pay

## 2024-02-27 ENCOUNTER — Other Ambulatory Visit: Payer: Self-pay

## 2024-02-28 ENCOUNTER — Ambulatory Visit: Payer: Self-pay

## 2024-02-28 ENCOUNTER — Ambulatory Visit (INDEPENDENT_AMBULATORY_CARE_PROVIDER_SITE_OTHER): Payer: Self-pay | Admitting: Orthopedic Surgery

## 2024-02-28 ENCOUNTER — Other Ambulatory Visit: Payer: Self-pay

## 2024-02-28 ENCOUNTER — Ambulatory Visit
Admission: RE | Admit: 2024-02-28 | Discharge: 2024-02-28 | Disposition: A | Discharge status: Home / Self Care | Payer: Self-pay | Source: Ambulatory Visit | Attending: Orthopaedic Surgery | Admitting: Orthopaedic Surgery

## 2024-02-28 DIAGNOSIS — Z981 Arthrodesis status: Secondary | ICD-10-CM

## 2024-02-28 DIAGNOSIS — Z9889 Other specified postprocedural states: Secondary | ICD-10-CM

## 2024-02-28 DIAGNOSIS — M1712 Unilateral primary osteoarthritis, left knee: Secondary | ICD-10-CM

## 2024-02-28 DIAGNOSIS — M25562 Pain in left knee: Secondary | ICD-10-CM

## 2024-02-28 MED ORDER — METHOCARBAMOL 500 MG PO TABS
500.0000 mg | ORAL_TABLET | Freq: Four times a day (QID) | ORAL | 1 refills | Status: AC | PRN
  Filled 2024-02-28 – 2024-04-02 (×3): qty 60, 15d supply, fill #0

## 2024-02-28 NOTE — Progress Notes (Signed)
 Orthopedic Surgery Post-operative Office Visit   Procedure: L2-L4 XLIF and L2-4 PSIF Date of Surgery: 05/16/2023 (~9 months post-op)   Assessment: Patient is a 73 y.o. who is doing well after surgery     Plan: -Operative plans complete -No spine specific precautions -Patient experiences cramping type pain in her legs, so prescribed methocarbamol  -Can use Tylenol  for additional pain relief -Return to office in 3 months, x-rays needed at next visit: AP/lateral/flex/ex lumbar   ___________________________________________________________________________     Subjective: Patient continues to do well since surgery.  She is not having any back pain.  Her proximal thigh pain and cramping is resolved.  She sometimes notices cramping pain in her calves.  She notices this mostly if she is still for too long.  This cramping is only noticed intermittently.  She does not notice it every day.  She has not tried any treatment for this besides Tylenol .   Objective:   General: no acute distress, appropriate affect, ambulating with walker Neurologic: alert, answering questions appropriately, following commands Respiratory: unlabored breathing on room air Skin: incisions are well healed   MSK (spine):   -Strength exam                                                   Left                  Right   EHL                              4/5                  4/5 TA                                 5/5                  5/5 GSC                             5/5                  5/5 Knee extension            5/5                  5/5 Hip flexion                    5/5                  5/5   -Sensory exam                           Sensation intact to light touch in L2-S1 nerve distributions of bilateral lower extremities  -Achilles DTR: 1/4 on the left, 1/4 on the right -Patellar tendon DTR: 1/4 in the left, 1/4 on the right -No beats of clonus bilaterally   Imaging: XRs of the lumbar spine from 02/28/2024  were independently reviewed and interpreted, showing disc height loss at T12/L1 and L1/2 with anterior osteophyte formation.  Interbody devices are seen at L2/3, L3/4, L4/5, L5/S1.  No lucency seen  around the interbody devices.  Interbody devices in appropriate position.  Posterior instrumentation from L2-S1.  No lucency seen around the instrumentation.  No evidence of instability on flexion/tension views.  No fracture or dislocation seen.     Patient name: Julia Knight Patient MRN: [id number] Date of visit: 02/28/24  Pre-operative Scores   ODI: 62 VAS back: 10/10          VAS leg: 10/10  6 Month Post-operative Scores   ODI: 32 VAS back: 1/10          VAS leg: 5/10

## 2024-02-29 ENCOUNTER — Ambulatory Visit: Payer: Self-pay | Admitting: Orthopaedic Surgery

## 2024-02-29 NOTE — Progress Notes (Signed)
Needs f/u appt with me.  Thanks.

## 2024-03-08 ENCOUNTER — Other Ambulatory Visit: Payer: Self-pay

## 2024-03-11 ENCOUNTER — Encounter: Payer: Self-pay | Admitting: Radiology

## 2024-03-12 ENCOUNTER — Ambulatory Visit (INDEPENDENT_AMBULATORY_CARE_PROVIDER_SITE_OTHER): Payer: Self-pay | Admitting: Orthopaedic Surgery

## 2024-03-12 DIAGNOSIS — M1712 Unilateral primary osteoarthritis, left knee: Secondary | ICD-10-CM | POA: Insufficient documentation

## 2024-03-12 DIAGNOSIS — S83242A Other tear of medial meniscus, current injury, left knee, initial encounter: Secondary | ICD-10-CM

## 2024-03-12 NOTE — Progress Notes (Signed)
 Office Visit Note   Patient: Julia Knight           Date of Birth: [birth year]           MRN: [id number] Visit Date: 03/12/2024              Requested by: Knute Thersia Bitters, FNP [address] [place],  [place] [address] PCP: Knute Thersia Bitters, FNP   Assessment & Plan: Visit Diagnoses:  1. Primary osteoarthritis of left knee   2. Acute medial meniscus tear of left knee, initial encounter     Plan: History of Present Illness Julia Knight is a 73 year old female with chronic kidney disease who presents with left knee pain.  She is here to discuss MRI scan.  She experiences severe pain localized to the lateral aspect of the left knee, persisting for over a month. The pain significantly impacts her daily activities. She feels a sensation of the knee needing to 'pop back into place' before she can move. She has not received cortisone injections for this issue. Her chronic kidney disease limits her ability to take certain medications for arthritis, affecting treatment options for the knee pain.  Results RADIOLOGY Knee MRI: Meniscus tear and osteoarthritis. Osteoarthritis between the patella and femur.  Examination of the left knee is unchanged from prior visit.  Tender to the lateral joint line.  Trace joint effusion.  Assessment and Plan Left knee osteoarthritis and medial meniscus tear Chronic left knee pain due to osteoarthritis and meniscus tear confirmed by MRI.  Cannot take NSAIDs due to chronic kidney disease. - Referred to physical therapy for muscle strengthening and pain reduction. - Discussed potential knee replacement if conservative measures fail. - Language barrier increased the complexity of the visit.  Follow-Up Instructions: No follow-ups on file.   Orders:  No orders of the defined types were placed in this encounter.  No orders of the defined types were placed in this encounter.     Procedures: No  procedures performed   Clinical Data: No additional findings.   Subjective: Chief Complaint  Patient presents with   Left Knee - Pain, Follow-up    HPI  Review of Systems  Constitutional: Negative.   HENT: Negative.    Eyes: Negative.   Respiratory: Negative.    Cardiovascular: Negative.   Endocrine: Negative.   Musculoskeletal: Negative.   Neurological: Negative.   Hematological: Negative.   Psychiatric/Behavioral: Negative.    All other systems reviewed and are negative.    Objective: Vital Signs: There were no vitals taken for this visit.  Physical Exam Vitals and nursing note reviewed.  Constitutional:      Appearance: She is well-developed.  HENT:     Head: Atraumatic.     Nose: Nose normal.  Eyes:     Extraocular Movements: Extraocular movements intact.  Cardiovascular:     Pulses: Normal pulses.  Pulmonary:     Effort: Pulmonary effort is normal.  Abdominal:     Palpations: Abdomen is soft.  Musculoskeletal:     Cervical back: Neck supple.  Skin:    General: Skin is warm.     Capillary Refill: Capillary refill takes less than 2 seconds.  Neurological:     Mental Status: She is alert. Mental status is at baseline.  Psychiatric:        Behavior: Behavior normal.        Thought Content: Thought content normal.        Judgment: Judgment  normal.     Ortho Exam  Specialty Comments:  No specialty comments available.  Imaging: No results found.   PMFS History: Patient Active Problem List   Diagnosis Date Noted   Primary osteoarthritis of left knee 03/12/2024   Lumbar radiculopathy 05/16/2023   Neuropathy 04/26/2023   CKD stage 3a, GFR 45-59 ml/min (HCC) 04/12/2023   Pulmonary nodule 12/13/2022   Malignant neoplasm of colon, unspecified (HCC) 12/08/2022   Wellness examination 10/13/2022   Balance problem 09/16/2022   Paresthesias 09/16/2022   Weakness of both arms 09/16/2022   Peripheral polyneuropathy 02/01/2022   Nonalcoholic  hepatosteatosis 03/29/2021   Pain of upper abdomen 03/19/2021   Hematemesis with nausea 03/19/2021   Pressure injury of sacral region, stage 2 (HCC) 09/18/2020   Spinal stenosis, 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   Spondylolisthesis at L4-L5 level 09/01/2020    Class: Chronic   Status post lumbar spinal fusion 09/01/2020   Hyperlipidemia 07/24/2020   Spondylolisthesis at L3-L4 level 05/22/2020   Anxiety 06/07/2018   Pre-ulcerative corn or callous 02/10/2017   Leukocytosis 08/09/2013   GERD (gastroesophageal reflux disease) 06/03/2013   Depression 03/08/2012   Primary hypertension 08/24/2011   T2DM (type 2 diabetes mellitus) (HCC) 02/28/2011   Past Medical History:  Diagnosis Date   Acute appendicitis 08/09/2013   Acute appendicitis with rupture 08/09/2013   Acute blood loss anemia 09/10/2020   Allergic rhinitis 08/24/2011   Allergy    Anxiety    Arthritis    back and hands   BMI 34.0-34.9,adult 05/22/2020   Cancer (HCC) 2024   colon per patient   Constipation due to opioid therapy 09/18/2020   COVID-19 11/16/2018   Depression    Diabetes mellitus without complication (HCC)    type 2   Dizziness 08/16/2017   Encounter for medical examination to establish care 07/24/2020   Gastroenteritis, infectious, presumed 08/16/2017   GERD (gastroesophageal reflux disease)    Glossitis 09/10/2020   Hyperlipemia    Hypertension    Left knee pain 02/28/2011   Left leg pain 08/14/2012   Left-sided low back pain with sciatica 10/09/2012   Midline low back pain with right-sided sciatica 02/01/2022   Neuromuscular disorder (HCC)    Postoperative anemia due to acute blood loss 09/03/2020   EBL 600cc received 300 in cell saver blood  Hgb 7.4-7.3 post op day #1        Pyelonephritis 04/25/2013   UTI (urinary tract infection) 06/03/2013    Family History  Problem Relation Age of Onset   Diabetes Brother    Colon cancer Neg Hx    Esophageal cancer Neg Hx    Rectal  cancer Neg Hx    Stomach cancer Neg Hx    Breast cancer Neg Hx     Past Surgical History:  Procedure Laterality Date   APPENDECTOMY     COLON SURGERY  12/2022   Laparoscopic Right Hemicolectomy   TONSILLECTOMY     removed as an adult   UPPER GASTROINTESTINAL ENDOSCOPY  10/13/2022   Social History   Occupational History   Not on file  Tobacco Use   Smoking status: Former    Types: Cigarettes    Passive exposure: Past   Smokeless tobacco: Never  Vaping Use   Vaping status: Never Used  Substance and Sexual Activity   Alcohol use: Never   Drug use: Never   Sexual activity: Not Currently    Birth control/protection: Post-menopausal

## 2024-03-28 ENCOUNTER — Ambulatory Visit (HOSPITAL_BASED_OUTPATIENT_CLINIC_OR_DEPARTMENT_OTHER): Payer: Self-pay | Admitting: Family Medicine

## 2024-04-02 ENCOUNTER — Other Ambulatory Visit: Payer: Self-pay

## 2024-04-02 ENCOUNTER — Other Ambulatory Visit: Payer: Self-pay | Admitting: Orthopedic Surgery

## 2024-04-02 ENCOUNTER — Other Ambulatory Visit: Payer: Self-pay | Admitting: Internal Medicine

## 2024-04-02 MED ORDER — TRAZODONE HCL 100 MG PO TABS
100.0000 mg | ORAL_TABLET | Freq: Every day | ORAL | 1 refills | Status: AC
  Filled 2024-04-02: qty 30, 30d supply, fill #0

## 2024-04-03 ENCOUNTER — Other Ambulatory Visit: Payer: Self-pay

## 2024-04-03 MED ORDER — IRON 325 (65 FE) MG PO TABS
325.0000 mg | ORAL_TABLET | Freq: Every day | ORAL | 0 refills | Status: AC
  Filled 2024-04-03: qty 30, 30d supply, fill #0

## 2024-05-30 ENCOUNTER — Ambulatory Visit: Payer: Self-pay | Admitting: Nurse Practitioner

## 2024-05-30 ENCOUNTER — Ambulatory Visit: Payer: Self-pay | Admitting: Orthopedic Surgery

## 2024-06-17 ENCOUNTER — Ambulatory Visit: Payer: Self-pay | Admitting: Orthopedic Surgery
